# Patient Record
Sex: Male | Born: 1990 | Race: Black or African American | Hispanic: No | Marital: Single | Smoking: Current every day smoker
Health system: Southern US, Community
[De-identification: ages and names within clinical notes are randomized; demographics above are authoritative.]

## PROBLEM LIST (undated history)

## (undated) DIAGNOSIS — E119 Type 2 diabetes mellitus without complications: Secondary | ICD-10-CM

## (undated) DIAGNOSIS — I10 Essential (primary) hypertension: Secondary | ICD-10-CM

## (undated) DIAGNOSIS — J45909 Unspecified asthma, uncomplicated: Secondary | ICD-10-CM

## (undated) DIAGNOSIS — K219 Gastro-esophageal reflux disease without esophagitis: Secondary | ICD-10-CM

## (undated) DIAGNOSIS — J392 Other diseases of pharynx: Secondary | ICD-10-CM

## (undated) DIAGNOSIS — J329 Chronic sinusitis, unspecified: Secondary | ICD-10-CM

## (undated) HISTORY — PX: FOOT SURGERY: SHX648

## (undated) HISTORY — DX: Essential (primary) hypertension: I10

## (undated) HISTORY — DX: Type 2 diabetes mellitus without complications: E11.9

---

## 2008-11-17 ENCOUNTER — Emergency Department (HOSPITAL_COMMUNITY): Admission: EM | Admit: 2008-11-17 | Discharge: 2008-11-18 | Payer: Self-pay | Admitting: Emergency Medicine

## 2009-04-10 ENCOUNTER — Emergency Department (HOSPITAL_COMMUNITY): Admission: EM | Admit: 2009-04-10 | Discharge: 2009-04-10 | Payer: Self-pay | Admitting: Family Medicine

## 2009-05-19 ENCOUNTER — Emergency Department (HOSPITAL_COMMUNITY): Admission: EM | Admit: 2009-05-19 | Discharge: 2009-05-19 | Payer: Self-pay | Admitting: Family Medicine

## 2011-06-04 ENCOUNTER — Emergency Department (HOSPITAL_COMMUNITY)
Admission: EM | Admit: 2011-06-04 | Discharge: 2011-06-04 | Disposition: A | Discharge status: Home / Self Care | Payer: 59 | Attending: Emergency Medicine | Admitting: Emergency Medicine

## 2011-06-04 DIAGNOSIS — R369 Urethral discharge, unspecified: Secondary | ICD-10-CM | POA: Insufficient documentation

## 2011-06-04 DIAGNOSIS — Z202 Contact with and (suspected) exposure to infections with a predominantly sexual mode of transmission: Secondary | ICD-10-CM | POA: Insufficient documentation

## 2011-06-04 DIAGNOSIS — N509 Disorder of male genital organs, unspecified: Secondary | ICD-10-CM | POA: Insufficient documentation

## 2011-06-04 DIAGNOSIS — J45909 Unspecified asthma, uncomplicated: Secondary | ICD-10-CM | POA: Insufficient documentation

## 2011-06-04 DIAGNOSIS — A630 Anogenital (venereal) warts: Secondary | ICD-10-CM | POA: Insufficient documentation

## 2011-06-04 LAB — URINALYSIS, ROUTINE W REFLEX MICROSCOPIC
Bilirubin Urine: NEGATIVE
Glucose, UA: NEGATIVE mg/dL
Ketones, ur: NEGATIVE mg/dL
pH: 6.5 (ref 5.0–8.0)

## 2011-06-04 LAB — URINE MICROSCOPIC-ADD ON

## 2011-06-07 LAB — GC/CHLAMYDIA PROBE AMP, GENITAL: GC Probe Amp, Genital: POSITIVE — AB

## 2011-08-26 LAB — COMPREHENSIVE METABOLIC PANEL
ALT: 43 U/L (ref 0–53)
AST: 32 U/L (ref 0–37)
Albumin: 3.9 g/dL (ref 3.5–5.2)
CO2: 26 mEq/L (ref 19–32)
Calcium: 8.7 mg/dL (ref 8.4–10.5)
GFR calc Af Amer: >60 mL/min (ref >60)
Sodium: 129 mEq/L — ABNORMAL LOW (ref 135–145)

## 2011-08-26 LAB — CBC
MCHC: 32.3 g/dL (ref 30.0–36.0)
Platelets: 202 10*3/uL (ref 150–400)
RBC: 5.09 MIL/uL (ref 4.22–5.81)
WBC: 6.3 10*3/uL (ref 4.0–10.5)

## 2011-08-26 LAB — URINALYSIS, ROUTINE W REFLEX MICROSCOPIC
Nitrite: NEGATIVE
Protein, ur: NEGATIVE mg/dL
Urobilinogen, UA: 1 mg/dL (ref 0.0–1.0)

## 2011-08-26 LAB — DIFFERENTIAL
Basophils Absolute: 0 10*3/uL (ref 0.0–0.1)
Basophils Relative: 0 % (ref 0–1)
Lymphocytes Relative: 24 % (ref 12–46)
Neutro Abs: 4 10*3/uL (ref 1.7–7.7)
Neutrophils Relative %: 63 % (ref 43–77)

## 2011-08-26 LAB — LIPASE, BLOOD: Lipase: 15 U/L (ref 11–59)

## 2011-08-26 LAB — URINE MICROSCOPIC-ADD ON

## 2012-01-10 ENCOUNTER — Encounter (HOSPITAL_COMMUNITY): Payer: Self-pay | Admitting: Emergency Medicine

## 2012-01-10 ENCOUNTER — Emergency Department (HOSPITAL_COMMUNITY)
Admission: EM | Admit: 2012-01-10 | Discharge: 2012-01-10 | Disposition: A | Discharge status: Home / Self Care | Payer: 59 | Source: Home / Self Care

## 2012-01-10 DIAGNOSIS — K007 Teething syndrome: Secondary | ICD-10-CM

## 2012-01-10 DIAGNOSIS — B9789 Other viral agents as the cause of diseases classified elsewhere: Secondary | ICD-10-CM

## 2012-01-10 MED ORDER — PSEUDOEPHEDRINE HCL 60 MG PO TABS: 60.0000 mg | ORAL_TABLET | Freq: Four times a day (QID) | ORAL | Status: AC | PRN

## 2012-01-10 MED ORDER — LORATADINE 10 MG PO TABS: 10.0000 mg | ORAL_TABLET | Freq: Every day | ORAL | Status: DC

## 2012-01-10 MED ORDER — HYDROCODONE-ACETAMINOPHEN 7.5-500 MG/15ML PO SOLN: 10.0000 mL | Freq: Four times a day (QID) | ORAL | Status: AC | PRN

## 2012-01-10 NOTE — Discharge Instructions (Signed)
Please read the information below.  You may return to the urgent care if any time for any worsening symptoms or any new symptoms that concern you.   Antibiotic Nonuse  Your caregiver felt that the infection or problem was not one that would be helped with an antibiotic. Infections may be caused by viruses or bacteria. Only a caregiver can tell which one of these is the likely cause of an illness. A cold is the most common cause of infection in both adults and children. A cold is a virus. Antibiotic treatment will have no effect on a viral infection. Viruses can lead to many lost days of work caring for sick children and many missed days of school. Children may catch as many as 10 "colds" or "flus" per year during which they can be tearful, cranky, and uncomfortable. The goal of treating a virus is aimed at keeping the ill person comfortable. Antibiotics are medications used to help the body fight bacterial infections. There are relatively few types of bacteria that cause infections but there are hundreds of viruses. While both viruses and bacteria cause infection they are very different types of germs. A viral infection will typically go away by itself within 7 to 10 days. Bacterial infections may spread or get worse without antibiotic treatment. Examples of bacterial infections are:  Sore throats (like strep throat or tonsillitis).   Infection in the lung (pneumonia).   Ear and skin infections.  Examples of viral infections are:  Colds or flus.   Most coughs and bronchitis.   Sore throats not caused by Strep.   Runny noses.  It is often best not to take an antibiotic when a viral infection is the cause of the problem. Antibiotics can kill off the helpful bacteria that we have inside our body and allow harmful bacteria to start growing. Antibiotics can cause side effects such as allergies, nausea, and diarrhea without helping to improve the symptoms of the viral infection. Additionally,  repeated uses of antibiotics can cause bacteria inside of our body to become resistant. That resistance can be passed onto harmful bacterial. The next time you have an infection it may be harder to treat if antibiotics are used when they are not needed. Not treating with antibiotics allows our own immune system to develop and take care of infections more efficiently. Also, antibiotics will work better for [place] when they are prescribed for bacterial infections. Treatments for a child that is ill may include:  Give extra fluids throughout the day to stay hydrated.   Get plenty of rest.   Only give your child over-the-counter or prescription medicines for pain, discomfort, or fever as directed by your caregiver.   The use of a cool mist humidifier may help stuffy noses.   Cold medications if suggested by your caregiver.  Your caregiver may decide to start you on an antibiotic if:  The problem you were seen for today continues for a longer length of time than expected.   You develop a secondary bacterial infection.  SEEK MEDICAL CARE IF:  Fever lasts longer than 5 days.   Symptoms continue to get worse after 5 to 7 days or become severe.   Difficulty in breathing develops.   Signs of dehydration develop (poor drinking, rare urinating, dark colored urine).   Changes in behavior or worsening tiredness (listlessness or lethargy).  Document Released: 01/16/2002 Document Revised: 07/20/2011 Document Reviewed: 07/15/2009 [facility] Patient Information 2012 [place], [place].Viral Infections A viral infection can be caused by  different types of viruses.Most viral infections are not serious and resolve on their own. However, some infections may cause severe symptoms and may lead to further complications. SYMPTOMS Viruses can frequently cause:  Minor sore throat.   Aches and pains.   Headaches.   Runny nose.   Different types of rashes.   Watery eyes.   Tiredness.   Cough.   Loss of  appetite.   Gastrointestinal infections, resulting in nausea, vomiting, and diarrhea.  These symptoms do not respond to antibiotics because the infection is not caused by bacteria. However, you might catch a bacterial infection following the viral infection. This is sometimes called a "superinfection." Symptoms of such a bacterial infection may include:  Worsening sore throat with pus and difficulty swallowing.   Swollen neck glands.   Chills and a high or persistent fever.   Severe headache.   Tenderness over the sinuses.   Persistent overall ill feeling (malaise), muscle aches, and tiredness (fatigue).   Persistent cough.   Yellow, green, or brown mucus production with coughing.  HOME CARE INSTRUCTIONS   Only take over-the-counter or prescription medicines for pain, discomfort, diarrhea, or fever as directed by your caregiver.   Drink enough water and fluids to keep your urine clear or pale yellow. Sports drinks can provide valuable electrolytes, sugars, and hydration.   Get plenty of rest and maintain proper nutrition. Soups and broths with crackers or rice are fine.  SEEK IMMEDIATE MEDICAL CARE IF:   You have severe headaches, shortness of breath, chest pain, neck pain, or an unusual rash.   You have uncontrolled vomiting, diarrhea, or you are unable to keep down fluids.   You or your child has an oral temperature above 102 F (38.9 C), not controlled by medicine.   Your baby is older than 3 months with a rectal temperature of 102 F (38.9 C) or higher.   Your baby is 22 months old or younger with a rectal temperature of 100.4 F (38 C) or higher.  MAKE SURE YOU:   Understand these instructions.   Will watch your condition.   Will get help right away if you are not doing well or get worse.  Document Released: 08/17/2005 Document Revised: 07/20/2011 Document Reviewed: 03/14/2011 [facility] Patient Information 2012 [place], [place].Teething Babies usually start cutting  teeth between 34 to 24 months of age and continue teething until they are about 23 years old. Because teething irritates the gums, it causes babies to cry, drool a lot, and to chew on things. In addition, you may notice a change in eating or sleeping habits. However, some babies never develop teething symptoms.  You can help relieve the pain of teething by using the following measures:  Massage your baby's gums firmly with your finger or an ice cube covered with a cloth. If you do this before meals, feeding is easier.   Let your baby chew on a wet wash cloth or teething ring that you have cooled in the freezer. Never tie a teething ring around your baby's neck. It could catch on something and choke your baby. Teething biscuits or frozen banana slices are good for chewing also.   Only give over-the-counter or prescription medicines for pain, discomfort, or fever as directed by your child's caregiver. Use numbing gels as directed by your child's caregiver. Numbing gels are less helpful than the measures described above and can be harmful in high doses.   Use a cup to give fluids if nursing or sucking from a  bottle is too difficult.  SEEK MEDICAL CARE IF:  Your baby does not respond to treatment.   Your baby has a fever.   Your baby has uncontrolled fussiness.   Your baby has red, swollen gums.   Your baby is wetting less diapers than normal (sign of dehydration).  Document Released: 12/15/2004 Document Revised: 07/20/2011 Document Reviewed: 03/02/2009 [facility] Patient Information 2012 [place], [place].Antibiotic Treatment and Drug Resistance Antibiotics are very helpful drugs. They are used to treat bacterial infections. But they do not help viral infections. Because antibiotics are used so often, some bacteria do not respond to them. Respiratory illnesses are mostly caused by viral infections. These will get better without using antibiotics. If an antibiotic is prescribed for a viral illness, a  patient may notice bad side effects. Some of these may be:  Allergic reactions.   Diarrhea.   Abdominal discomfort.   Vomiting.   Yeast infection.  Antibiotics also can be expensive. Paying for an antibiotic to treat a viral illness is both a medical risk and a waste of money. The use of antibiotics has fostered the growth of resistant bacteria. These bacteria can cause infections which are harder to treat. For these reasons, doctors are more selective in prescribing antibiotics. If you have not received an antibiotic today, be sure to follow up as directed if you are not improving. If you have received an antibiotic, take it as directed. Finish the full course. Keep your caregiver informed of any side effects. Document Released: 12/15/2004 Document Revised: 07/20/2011 Document Reviewed: 11/07/2005 [facility] Patient Information 2012 [place], [place].

## 2012-01-10 NOTE — ED Provider Notes (Signed)
Medical screening examination/treatment/procedure(s) were performed by non-physician practitioner and as supervising physician I was immediately available for consultation/collaboration.   [facility]; MD   Sharin Grave, MD 01/10/12 2120

## 2012-01-10 NOTE — ED Notes (Signed)
Pt. Stated, I think i have a sinus infection since last Wed.  My head, face, ear and jaw hurts.

## 2012-01-10 NOTE — ED Provider Notes (Signed)
History     CSN: [id number]  Arrival date & time 01/10/12  1854   None     Chief Complaint  Patient presents with  . Headache    (Consider location/radiation/quality/duration/timing/severity/associated sxs/prior treatment) HPI Comments: Patient reports he has had cough, sore throat, nasal congestion, and bilateral ear pain x 6 days  - states all of these symptoms have been slowly improving and then 3 days ago he began having right rear molar pain that radiates into his forehead and sinsuses.  States his symptoms are worse with the cold air.  Pt thinks he had fevers the first few days he was sick but this has since resolved.   Patient is a 21 y.o. male presenting with headaches. The history is provided by the patient.  Headache The primary symptoms include headaches.  The headache is not associated with neck stiffness.  Additional symptoms do not include neck stiffness.    History reviewed. No pertinent past medical history.  History reviewed. No pertinent past surgical history.  No family history on file.  History  Substance Use Topics  . Smoking status: Current Everyday Smoker -- 5 years  . Smokeless tobacco: Not on file  . Alcohol Use:      occassional      Review of Systems  HENT: Negative for trouble swallowing, neck pain and neck stiffness.   Neurological: Positive for headaches.  All other systems reviewed and are negative.    Allergies  Review of patient's allergies indicates no known allergies.  Home Medications  No current outpatient prescriptions on file.  BP 128/60  Pulse 81  Temp(Src) 99.3 F (37.4 C) (Oral)  Resp 16  SpO2 100%  Physical Exam  Nursing note and vitals reviewed. Constitutional: He is oriented to person, place, and time. He appears well-developed and well-nourished. No distress.  HENT:  Head: Normocephalic and atraumatic.  Right Ear: Tympanic membrane and ear canal normal.  Left Ear: Tympanic membrane and ear canal normal.    Nose: Mucosal edema and rhinorrhea present.  Mouth/Throat: Uvula is midline and mucous membranes are normal. Mucous membranes are not dry. No uvula swelling. No oropharyngeal exudate, posterior oropharyngeal edema, posterior oropharyngeal erythema or tonsillar abscesses.    Neck: Trachea normal, normal range of motion and phonation normal. Neck supple. No tracheal tenderness present. No rigidity. No tracheal deviation and normal range of motion present.  Cardiovascular: Normal rate, regular rhythm and normal heart sounds.   Pulmonary/Chest: Effort normal and breath sounds normal. No stridor. No respiratory distress. He has no wheezes. He has no rales.  Neurological: He is alert and oriented to person, place, and time.  Skin: He is not diaphoretic.    ED Course  Procedures (including critical care time)  Labs Reviewed - No data to display No results found.   1. Tooth eruption   2. Viral respiratory illness       MDM  Pt with six days of URI that is improving with exception of bilateral ear pain and nasal congestion, now with dental eruption (3rd right lower wisdom tooth).  Lungs CTAB- doubt pneumonia, oropharynx without exudate or erythema- doubt strep.  TMs are normal.  Pt's pain is likely related to nasal congestion from lasting viral infection coupled with tooth eruption.  Pt d/c home with symptomatic treatment.  Patient verbalizes understanding and agrees with plan.          Dillard Cannon [place], [place] 01/10/12 2108

## 2012-04-13 ENCOUNTER — Emergency Department (INDEPENDENT_AMBULATORY_CARE_PROVIDER_SITE_OTHER): Payer: 59

## 2012-04-13 ENCOUNTER — Encounter (HOSPITAL_COMMUNITY): Payer: Self-pay | Admitting: *Deleted

## 2012-04-13 ENCOUNTER — Emergency Department (HOSPITAL_COMMUNITY)
Admission: EM | Admit: 2012-04-13 | Discharge: 2012-04-13 | Disposition: A | Discharge status: Home / Self Care | Payer: 59 | Source: Home / Self Care | Attending: Family Medicine | Admitting: Family Medicine

## 2012-04-13 DIAGNOSIS — J45909 Unspecified asthma, uncomplicated: Secondary | ICD-10-CM

## 2012-04-13 DIAGNOSIS — J45901 Unspecified asthma with (acute) exacerbation: Secondary | ICD-10-CM

## 2012-04-13 MED ORDER — METHYLPREDNISOLONE ACETATE 40 MG/ML IJ SUSP
80.0000 mg | Freq: Once | INTRAMUSCULAR | Status: AC
  Administered 2012-04-13: 80 mg via INTRAMUSCULAR

## 2012-04-13 MED ORDER — ALBUTEROL SULFATE (5 MG/ML) 0.5% IN NEBU
5.0000 mg | INHALATION_SOLUTION | Freq: Once | RESPIRATORY_TRACT | Status: AC
  Administered 2012-04-13: 5 mg via RESPIRATORY_TRACT

## 2012-04-13 MED ORDER — TRIAMCINOLONE ACETONIDE 40 MG/ML IJ SUSP
INTRAMUSCULAR | Status: AC
  Filled 2012-04-13: qty 5

## 2012-04-13 MED ORDER — TRIAMCINOLONE ACETONIDE 40 MG/ML IJ SUSP
40.0000 mg | Freq: Once | INTRAMUSCULAR | Status: AC
  Administered 2012-04-13: 40 mg via INTRAMUSCULAR

## 2012-04-13 MED ORDER — METHYLPREDNISOLONE ACETATE 80 MG/ML IJ SUSP
INTRAMUSCULAR | Status: AC
  Filled 2012-04-13: qty 1

## 2012-04-13 MED ORDER — ALBUTEROL SULFATE HFA 108 (90 BASE) MCG/ACT IN AERS: 1.0000 | INHALATION_SPRAY | Freq: Four times a day (QID) | RESPIRATORY_TRACT | Status: DC | PRN

## 2012-04-13 MED ORDER — ALBUTEROL SULFATE (5 MG/ML) 0.5% IN NEBU
INHALATION_SOLUTION | RESPIRATORY_TRACT | Status: AC
  Filled 2012-04-13: qty 1

## 2012-04-13 MED ORDER — IPRATROPIUM BROMIDE 0.02 % IN SOLN
0.5000 mg | Freq: Once | RESPIRATORY_TRACT | Status: AC
  Administered 2012-04-13: 0.5 mg via RESPIRATORY_TRACT

## 2012-04-13 NOTE — ED Provider Notes (Signed)
History     CSN: [id number]  Arrival date & time 04/13/12  4098   First MD Initiated Contact with Patient 04/13/12 [phone]      Chief Complaint  Patient presents with  . Cough    (Consider location/radiation/quality/duration/timing/severity/associated sxs/prior treatment) Patient is a 21 y.o. male presenting with cough. The history is provided by the patient.  Cough This is a new problem. 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 The problem has been gradually worsening. The cough is productive of sputum. There has been no fever. Associated symptoms include shortness of breath and wheezing. He has tried nothing (has not had mdi for sev years.) for the symptoms. He is a smoker. His past medical history is significant for asthma.    History reviewed. No pertinent past medical history.  History reviewed. No pertinent past surgical history.  No family history on file.  History  Substance Use Topics  . Smoking status: Current Everyday Smoker -- 5 years  . Smokeless tobacco: Not on file  . Alcohol Use: 0.0 oz/week    1-2 Cans of beer per week     occassional      Review of Systems  Constitutional: Negative.   Respiratory: Positive for cough, shortness of breath and wheezing.     Allergies  Review of patient's allergies indicates no known allergies.  Home Medications   Current Outpatient Rx  Name Route Sig Dispense Refill  . ALBUTEROL SULFATE HFA 108 (90 BASE) MCG/ACT IN AERS Inhalation Inhale 1-2 puffs into the lungs every 6 (six) hours as needed for wheezing. 1 Inhaler 0  . LORATADINE 10 MG PO TABS Oral Take 1 tablet (10 mg total) by mouth daily. 15 tablet 0    BP 135/78  Pulse 61  Temp(Src) 98.5 F (36.9 C) (Oral)  Resp 20  SpO2 98%  Physical Exam  Nursing note and vitals reviewed. Constitutional: He is oriented to person, place, and time. He appears well-developed and well-nourished.    HENT:  Head: Normocephalic.  Right Ear: External ear normal.  Left Ear: External ear normal.  Nose: Nose normal.  Mouth/Throat: Oropharynx is clear and moist.  Eyes: Pupils are equal, round, and reactive to light.  Neck: Normal range of motion. Neck supple.  Cardiovascular: Regular rhythm and normal heart sounds.   Pulmonary/Chest: Not tachypneic. No respiratory distress. He has wheezes. He has rhonchi.  Lymphadenopathy:    He has no cervical adenopathy.  Neurological: He is alert and oriented to person, place, and time.  Skin: Skin is warm and dry.    ED Course  Procedures (including critical care time)  Labs Reviewed - No data to display Dg Chest 2 View  04/13/2012  *RADIOLOGY REPORT*  Clinical Data: Cough.  Asthma.  CHEST - 2 VIEW  Comparison:  None.  Findings:  The heart size and mediastinal contours are within normal limits.  Both lungs are clear.  The visualized skeletal structures are unremarkable.  IMPRESSION: No active cardiopulmonary disease.  Original Report Authenticated By: Danae Orleans, M.D.     1. Asthma attack       MDM  X-rays reviewed and report per radiologist.  Lungs clear and sx improved after meds.         Linna Hoff, MD 04/13/12 [phone]

## 2012-04-13 NOTE — ED Notes (Signed)
Pt   Reports     He  Has  Asthma        He  Reports  He  Used  To  Take  Albuterol        Last   Time  He  Used  It  Was  2  Years  Ago      He  Reports     He  Started  A  New  Job  Painting      Recently         - he  Is  Wheezing  And  Congested        He  Is  Also a  Smoker

## 2013-01-03 ENCOUNTER — Encounter (HOSPITAL_COMMUNITY): Payer: Self-pay | Admitting: Emergency Medicine

## 2013-01-03 ENCOUNTER — Inpatient Hospital Stay (HOSPITAL_COMMUNITY)
Admission: EM | Admit: 2013-01-03 | Discharge: 2013-01-05 | DRG: 639 | Disposition: A | Discharge status: Home / Self Care | Payer: 59 | Attending: Internal Medicine | Admitting: Internal Medicine

## 2013-01-03 DIAGNOSIS — F17200 Nicotine dependence, unspecified, uncomplicated: Secondary | ICD-10-CM | POA: Diagnosis present

## 2013-01-03 DIAGNOSIS — R030 Elevated blood-pressure reading, without diagnosis of hypertension: Secondary | ICD-10-CM | POA: Diagnosis present

## 2013-01-03 DIAGNOSIS — E1010 Type 1 diabetes mellitus with ketoacidosis without coma: Principal | ICD-10-CM | POA: Diagnosis present

## 2013-01-03 DIAGNOSIS — K5900 Constipation, unspecified: Secondary | ICD-10-CM | POA: Diagnosis present

## 2013-01-03 DIAGNOSIS — R739 Hyperglycemia, unspecified: Secondary | ICD-10-CM

## 2013-01-03 DIAGNOSIS — Z79899 Other long term (current) drug therapy: Secondary | ICD-10-CM

## 2013-01-03 DIAGNOSIS — Z833 Family history of diabetes mellitus: Secondary | ICD-10-CM

## 2013-01-03 DIAGNOSIS — E1110 Type 2 diabetes mellitus with ketoacidosis without coma: Secondary | ICD-10-CM | POA: Diagnosis present

## 2013-01-03 DIAGNOSIS — E876 Hypokalemia: Secondary | ICD-10-CM | POA: Diagnosis not present

## 2013-01-03 DIAGNOSIS — J45909 Unspecified asthma, uncomplicated: Secondary | ICD-10-CM | POA: Diagnosis present

## 2013-01-03 DIAGNOSIS — Z791 Long term (current) use of non-steroidal anti-inflammatories (NSAID): Secondary | ICD-10-CM

## 2013-01-03 DIAGNOSIS — E109 Type 1 diabetes mellitus without complications: Secondary | ICD-10-CM | POA: Diagnosis present

## 2013-01-03 HISTORY — DX: Unspecified asthma, uncomplicated: J45.909

## 2013-01-03 LAB — COMPREHENSIVE METABOLIC PANEL
ALT: 14 U/L (ref 0–53)
AST: 12 U/L (ref 0–37)
Albumin: 4.2 g/dL (ref 3.5–5.2)
Alkaline Phosphatase: 117 U/L (ref 39–117)
BUN: 7 mg/dL (ref 6–23)
CO2: 22 mEq/L (ref 19–32)
Calcium: 9.6 mg/dL (ref 8.4–10.5)
Chloride: 82 mEq/L — ABNORMAL LOW (ref 96–112)
Creatinine, Ser: 0.86 mg/dL (ref 0.50–1.35)
GFR calc Af Amer: >90 mL/min (ref >90)
GFR calc non Af Amer: >90 mL/min (ref >90)
Glucose, Bld: 907 mg/dL (ref 70–99)
Potassium: 3.9 mEq/L (ref 3.5–5.1)
Sodium: 124 mEq/L — ABNORMAL LOW (ref 135–145)
Total Bilirubin: 0.4 mg/dL (ref 0.3–1.2)
Total Protein: 7.9 g/dL (ref 6.0–8.3)

## 2013-01-03 LAB — URINALYSIS, ROUTINE W REFLEX MICROSCOPIC
Bilirubin Urine: NEGATIVE
Glucose, UA: >1000 mg/dL — AB
Hgb urine dipstick: NEGATIVE
Ketones, ur: 15 mg/dL — AB
Leukocytes, UA: NEGATIVE
Nitrite: NEGATIVE
Protein, ur: NEGATIVE mg/dL
Specific Gravity, Urine: 1.035 — ABNORMAL HIGH (ref 1.005–1.030)
Urobilinogen, UA: 0.2 mg/dL (ref 0.0–1.0)
pH: 6 (ref 5.0–8.0)

## 2013-01-03 LAB — CBC
HCT: 42.7 % (ref 39.0–52.0)
Hemoglobin: 14.3 g/dL (ref 13.0–17.0)
MCH: 28.1 pg (ref 26.0–34.0)
MCHC: 33.5 g/dL (ref 30.0–36.0)
MCV: 83.9 fL (ref 78.0–100.0)
Platelets: 220 10*3/uL (ref 150–400)
RBC: 5.09 MIL/uL (ref 4.22–5.81)
RDW: 12.7 % (ref 11.5–15.5)
WBC: 9.1 10*3/uL (ref 4.0–10.5)

## 2013-01-03 LAB — GLUCOSE, CAPILLARY
Glucose-Capillary: >600 mg/dL (ref 70–99)
Glucose-Capillary: 359 mg/dL — ABNORMAL HIGH (ref 70–99)
Glucose-Capillary: 281 mg/dL — ABNORMAL HIGH (ref 70–99)
Glucose-Capillary: 277 mg/dL — ABNORMAL HIGH (ref 70–99)

## 2013-01-03 LAB — URINE MICROSCOPIC-ADD ON: Urine-Other: NONE SEEN

## 2013-01-03 MED ORDER — ONDANSETRON HCL 4 MG/2ML IJ SOLN: 4.0000 mg | Freq: Three times a day (TID) | INTRAMUSCULAR | Status: AC | PRN

## 2013-01-03 MED ORDER — SODIUM CHLORIDE 0.9 % IV SOLN
INTRAVENOUS | Status: DC
  Filled 2013-01-03: qty 1

## 2013-01-03 MED ORDER — SODIUM CHLORIDE 0.9 % IV SOLN
INTRAVENOUS | Status: DC
  Administered 2013-01-03: 5.4 [IU]/h via INTRAVENOUS
  Filled 2013-01-03: qty 1

## 2013-01-03 MED ORDER — POTASSIUM CHLORIDE 10 MEQ/100ML IV SOLN
10.0000 meq | INTRAVENOUS | Status: AC
  Administered 2013-01-04 (×4): 10 meq via INTRAVENOUS
  Filled 2013-01-03: qty 100

## 2013-01-03 MED ORDER — SODIUM CHLORIDE 0.9 % IV SOLN: INTRAVENOUS | Status: DC

## 2013-01-03 MED ORDER — SODIUM CHLORIDE 0.9 % IV SOLN
INTRAVENOUS | Status: DC
  Administered 2013-01-03: 20:00:00 via INTRAVENOUS

## 2013-01-03 MED ORDER — DEXTROSE-NACL 5-0.45 % IV SOLN: INTRAVENOUS | Status: DC

## 2013-01-03 MED ORDER — LORATADINE 10 MG PO TABS
10.0000 mg | ORAL_TABLET | Freq: Every day | ORAL | Status: DC
  Administered 2013-01-04 – 2013-01-05 (×2): 10 mg via ORAL
  Filled 2013-01-03 (×2): qty 1

## 2013-01-03 MED ORDER — DEXTROSE-NACL 5-0.45 % IV SOLN
INTRAVENOUS | Status: DC
  Administered 2013-01-04: via INTRAVENOUS

## 2013-01-03 MED ORDER — SODIUM CHLORIDE 0.9 % IV BOLUS (SEPSIS)
1000.0000 mL | Freq: Once | INTRAVENOUS | Status: AC
  Administered 2013-01-03: 1000 mL via INTRAVENOUS

## 2013-01-03 MED ORDER — ALBUTEROL SULFATE HFA 108 (90 BASE) MCG/ACT IN AERS
1.0000 | INHALATION_SPRAY | Freq: Four times a day (QID) | RESPIRATORY_TRACT | Status: DC | PRN
  Filled 2013-01-03: qty 6.7

## 2013-01-03 MED ORDER — INSULIN REGULAR BOLUS VIA INFUSION
0.0000 [IU] | Freq: Three times a day (TID) | INTRAVENOUS | Status: DC
  Filled 2013-01-03: qty 10

## 2013-01-03 MED ORDER — ENOXAPARIN SODIUM 40 MG/0.4ML ~~LOC~~ SOLN
40.0000 mg | Freq: Every day | SUBCUTANEOUS | Status: DC
  Administered 2013-01-04 (×2): 40 mg via SUBCUTANEOUS
  Filled 2013-01-03 (×3): qty 0.4

## 2013-01-03 MED ORDER — DEXTROSE 50 % IV SOLN: 25.0000 mL | INTRAVENOUS | Status: DC | PRN

## 2013-01-03 NOTE — ED Notes (Signed)
Pt states he's had dry mouth, excessive thirst, and increased urination so he checked his blood sugar using his friend's glucometer and it read "High". Pt denies h/o diabetes and states he's never had problems with his blood sugar that he knows of.

## 2013-01-03 NOTE — ED Notes (Signed)
Need pump for insulin drip called portable awaiting arrival. PA informed

## 2013-01-03 NOTE — ED Notes (Signed)
[id number]<AV> Expected date:<BR> Expected time:<BR> Means of arrival:<BR> Comments:<BR> Christopher Douglas, triage

## 2013-01-03 NOTE — ED Provider Notes (Signed)
Medical screening examination/treatment/procedure(s) were conducted as a shared visit with non-physician practitioner(s) and myself.  I personally evaluated the patient during the encounter  Pt with polydypsia, fatigue, weight loss over 1 month time.  No obv family history of DM that he knows of.  No other sig PMH.  No N/V.   Results for orders placed during the hospital encounter of 01/03/13 (from the past 24 hour(s))  URINALYSIS, ROUTINE W REFLEX MICROSCOPIC     Status: Abnormal   Collection Time    01/03/13  5:17 PM      Result Value Range   Color, Urine YELLOW  YELLOW   APPearance CLEAR  CLEAR   Specific Gravity, Urine 1.035 (*) 1.005 - 1.030   pH 6.0  5.0 - 8.0   Glucose, UA >1000 (*) NEGATIVE mg/dL   Hgb urine dipstick NEGATIVE  NEGATIVE   Bilirubin Urine NEGATIVE  NEGATIVE   Ketones, ur 15 (*) NEGATIVE mg/dL   Protein, ur NEGATIVE  NEGATIVE mg/dL   Urobilinogen, UA 0.2  0.0 - 1.0 mg/dL   Nitrite NEGATIVE  NEGATIVE   Leukocytes, UA NEGATIVE  NEGATIVE  URINE MICROSCOPIC-ADD ON     Status: None   Collection Time    01/03/13  5:17 PM      Result Value Range   Urine-Other       Value: NO FORMED ELEMENTS SEEN ON URINE MICROSCOPIC EXAMINATION  GLUCOSE, CAPILLARY     Status: Abnormal   Collection Time    01/03/13  5:22 PM      Result Value Range   Glucose-Capillary >600 (*) 70 - 99 mg/dL  CBC     Status: None   Collection Time    01/03/13  6:00 PM      Result Value Range   WBC 9.1  4.0 - 10.5 K/uL   RBC 5.09  4.22 - 5.81 MIL/uL   Hemoglobin 14.3  13.0 - 17.0 g/dL   HCT 29.5  62.1 - 30.8 %   MCV 83.9  78.0 - 100.0 fL   MCH 28.1  26.0 - 34.0 pg   MCHC 33.5  30.0 - 36.0 g/dL   RDW 65.7  84.6 - 96.2 %   Platelets 220  150 - 400 K/uL  COMPREHENSIVE METABOLIC PANEL     Status: Abnormal   Collection Time    01/03/13  6:00 PM      Result Value Range   Sodium 124 (*) 135 - 145 mEq/L   Potassium 3.9  3.5 - 5.1 mEq/L   Chloride 82 (*) 96 - 112 mEq/L   CO2 22  19 - 32 mEq/L   Glucose, Bld 907 (*) 70 - 99 mg/dL   BUN 7  6 - 23 mg/dL   Creatinine, Ser 9.52  0.50 - 1.35 mg/dL   Calcium 9.6  8.4 - 84.1 mg/dL   Total Protein 7.9  6.0 - 8.3 g/dL   Albumin 4.2  3.5 - 5.2 g/dL   AST 12  0 - 37 U/L   ALT 14  0 - 53 U/L   Alkaline Phosphatase 117  39 - 117 U/L   Total Bilirubin 0.4  0.3 - 1.2 mg/dL   GFR calc non Af Amer >90  >90 mL/min   GFR calc Af Amer >90  >90 mL/min  GLUCOSE, CAPILLARY     Status: Abnormal   Collection Time    01/03/13  7:01 PM      Result Value Range   Glucose-Capillary >600 (*)  70 - 99 mg/dL      Sig hyperglycemia, new onset DM.  Given age, possibly type 1.  Pt with ketones in urine, anion gap of 20 suggests early DKA.  Requires sig IVF resuscitation, IV insulin and admission to telemtry or step down.  Not hypotensive, normal mentation.      CRITICAL CARE Performed by: Lear Ng   Total critical care time: 30 min  Critical care time was exclusive of separately billable procedures and treating other patients.  Critical care was necessary to treat or prevent imminent or life-threatening deterioration.  Critical care was time spent personally by me on the following activities: development of treatment plan with patient and/or surrogate as well as nursing, discussions with consultants, evaluation of patient's response to treatment, examination of patient, obtaining history from patient or surrogate, ordering and performing treatments and interventions, ordering and review of laboratory studies, ordering and review of radiographic studies, pulse oximetry and re-evaluation of patient's condition.    Impression: Early DKA   Christopher Douglas. Oletta Lamas, MD 01/03/13 1610

## 2013-01-03 NOTE — ED Notes (Signed)
Pt in room sitting up alert and oriented w/o any s/s of hyperglycemia at time. Pt with family at bedside. Will continue to mointor.

## 2013-01-03 NOTE — ED Notes (Signed)
Called to give report nurse unavailable will call back.  

## 2013-01-03 NOTE — H&P (Signed)
[facility] History and Physical  Tommy Goostree ZOX:[id number] DOB: 02-May-1991 DOA: 01/03/2013  Referring physician: Dr. Oletta Lamas. PCP: No primary provider on file.  Specialists: None.  Chief Complaint: High blood sugar and polyuria and polydipsia.  HPI: Christopher Douglas is a 22 y.o. male with history of asthma was experiencing polyuria and polydipsia with fatigue over the last 2-3 weeks. He checked his blood sugar today with help of his girlfriend who is a Engineer, civil (consulting) and was found to have a high blood sugar and was unrecordable in the glucometer. Patient was brought to the ER and labs repeat high blood sugar and patient does not have history of diabetes. Patient otherwise denies any chest pain shortness of breath nausea vomiting or diarrhea. He's been feeling fatigued with some weight loss over the last few months. Labs reveal mildly elevated anion gap patient has been started on IV insulin infusion. Patient is admitted for DKA.  Review of Systems: The patient denies anorexia, fever, vision loss, decreased hearing, hoarseness, chest pain, syncope, dyspnea on exertion, peripheral edema, balance deficits, hemoptysis, abdominal pain, melena, hematochezia, severe indigestion/heartburn, hematuria, incontinence, genital sores, muscle weakness, suspicious skin lesions, transient blindness, difficulty walking, depression, abnormal bleeding, enlarged lymph nodes, angioedema, and breast masses. Has weight loss and fatigue.  Past Medical History  Diagnosis Date  . Asthma    Past Surgical History  Procedure Laterality Date  . Foot surgery     Social History:  reports that he has been smoking.  He does not have any smokeless tobacco history on file. He reports that  drinks alcohol. He reports that he does not use illicit drugs. At home. where does patient live--home, ALF, SNF? and with whom if at home? He can perform ADLs. Can patient participate in ADLs?  No Known Allergies  Family History  Problem Relation  Age of Onset  . Diabetes Mellitus II Father     Prior to Admission medications   Medication Sig Start Date End Date Taking? Authorizing Provider  albuterol (PROVENTIL HFA;VENTOLIN HFA) 108 (90 BASE) MCG/ACT inhaler Inhale 1-2 puffs into the lungs every 6 (six) hours as needed for wheezing. 04/13/12 04/13/13 Yes Linna Hoff, MD  ibuprofen (ADVIL,MOTRIN) 200 MG tablet Take 600 mg by mouth every 6 (six) hours as needed for pain.   Yes Historical Provider, MD  loratadine (CLARITIN) 10 MG tablet Take 1 tablet (10 mg total) by mouth daily. 01/10/12 01/09/13 Yes Trixie Dredge, PA   Physical Exam: Filed Vitals:   01/03/13 1724 01/03/13 1941  BP: 140/78 132/78  Pulse: 116 94  Temp: 98.7 F (37.1 C) 98.8 F (37.1 C)  TempSrc: Oral Oral  Resp: 18 16  SpO2: 100% 98%     General:  Well-built and well-nourished. Not in distress.  Eyes: Anicteric no pallor.  ENT: No discharge from ears eyes nose. Mouth appears dry.  Neck: No mass felt.  Cardiovascular: S1-S2 heard.  Respiratory: Bilateral air entry present. No rhonchi crepitations.  Abdomen: Soft nontender bowel sounds present.  Skin: No erythema rash.  Musculoskeletal: Nontender.  Psychiatric: Appears normal.  Neurologic: Alert awake oriented time place and person moves upper and lower extremities 5 x 5.  Labs on Admission:  Basic Metabolic Panel:  Recent Labs Lab 01/03/13 1800  NA 124*  K 3.9  CL 82*  CO2 22  GLUCOSE 907*  BUN 7  CREATININE 0.86  CALCIUM 9.6   Liver Function Tests:  Recent Labs Lab 01/03/13 1800  AST 12  ALT 14  ALKPHOS 117  BILITOT 0.4  PROT 7.9  ALBUMIN 4.2   No results found for this basename: LIPASE, AMYLASE,  in the last 168 hours No results found for this basename: AMMONIA,  in the last 168 hours CBC:  Recent Labs Lab 01/03/13 1800  WBC 9.1  HGB 14.3  HCT 42.7  MCV 83.9  PLT 220   Cardiac Enzymes: No results found for this basename: CKTOTAL, CKMB, CKMBINDEX, TROPONINI,  in  the last 168 hours  BNP (last 3 results) No results found for this basename: PROBNP,  in the last 8760 hours CBG:  Recent Labs Lab 01/03/13 1722 01/03/13 1901  GLUCAP >600* >600*    Radiological Exams on Admission: No results found.   Assessment/Plan Principal Problem:   DKA (diabetic ketoacidoses) Active Problems:   Asthma   1. DKA - patient has been started on IV fluids and IV insulin infusion. Continue with IV insulin until anion is corrected. Check hemoglobin A1c. At this time we will treat this as new onset diabetes mellitus type 1. Check anti- islet cell antibodies, anti-GAD antibodies, C-peptide, anti-insulin antibodies to check whether patient has type I or type 2 diabetes. 2. Asthma - presently not wheezing. Continue albuterol when necessary. 3. Tobacco abuse - patient states he quit last month. Congratulated him for that.  No consultants. if consultant consulted, please document name and whether formally or informally consulted  Code Status: Full code. (must indicate code status--if unknown or must be presumed, indicate so) Family Communication: None at the bedside. (indicate person spoken with, if applicable, with phone number if by telephone) Disposition Plan: Admit inpatient. (indicate anticipated LOS)  Bonetta Mostek N. [facility] Pager [phone]  If 7PM-7AM, please contact night-coverage [email or web address] Password [facility] 01/03/2013, 8:12 PM

## 2013-01-03 NOTE — ED Notes (Signed)
Pt reports polyuria and polydipsia x3 weeks. Friend at bedside also reports fruity smell to pt's breath.

## 2013-01-03 NOTE — ED Provider Notes (Signed)
History     CSN: [id number]  Arrival date & time 01/03/13  1708   First MD Initiated Contact with Patient 01/03/13 1727      Chief Complaint  Patient presents with  . Hyperglycemia    (Consider location/radiation/quality/duration/timing/severity/associated sxs/prior treatment) HPI Christopher Douglas is a 22 y.o. male who presents to ED with complaint of elevated blood sugar. States he is not a known diabetic. States his girlfriend is a Engineer, civil (consulting). He has been having polyuria, polydypsia, weakness for the last month. States lost about 45 lb in the last few months un intentionally. States worsened in the last week. His girlfriend had him measure his blood sugar today and it said "high." Pt denies any medical problems. No recent illnesses or infections. Denies fever, chills, cough, URI symptoms, n/v/d.    History reviewed. No pertinent past medical history.  History reviewed. No pertinent past surgical history.  History reviewed. No pertinent family history.  History  Substance Use Topics  . Smoking status: Current Every Day Smoker -- 5 years  . Smokeless tobacco: Not on file  . Alcohol Use: 0.0 oz/week    1-2 Cans of beer per week     Comment: occassional      Review of Systems  Constitutional: Positive for appetite change and fatigue. Negative for fever and chills.  HENT: Negative for neck pain and neck stiffness.   Respiratory: Negative.   Cardiovascular: Negative.   Gastrointestinal: Negative for nausea, vomiting, abdominal pain and diarrhea.  Endocrine: Positive for polydipsia and polyuria. Negative for polyphagia.  Genitourinary: Negative for dysuria and flank pain.  Musculoskeletal: Negative.   Skin: Negative.   Neurological: Positive for weakness.  All other systems reviewed and are negative.    Allergies  Review of patient's allergies indicates no known allergies.  Home Medications   Current Outpatient Rx  Name  Route  Sig  Dispense  Refill  . albuterol (PROVENTIL  HFA;VENTOLIN HFA) 108 (90 BASE) MCG/ACT inhaler   Inhalation   Inhale 1-2 puffs into the lungs every 6 (six) hours as needed for wheezing.   1 Inhaler   0   . ibuprofen (ADVIL,MOTRIN) 200 MG tablet   Oral   Take 600 mg by mouth every 6 (six) hours as needed for pain.         Marland Kitchen loratadine (CLARITIN) 10 MG tablet   Oral   Take 1 tablet (10 mg total) by mouth daily.   15 tablet   0     BP 140/78  Pulse 116  Temp(Src) 98.7 F (37.1 C) (Oral)  Resp 18  SpO2 100%  Physical Exam  Nursing note and vitals reviewed. Constitutional: He is oriented to person, place, and time. He appears well-developed and well-nourished. No distress.  Eyes: Conjunctivae are normal.  Neck: Neck supple.  Cardiovascular: Normal rate, regular rhythm and normal heart sounds.   Pulmonary/Chest: Effort normal and breath sounds normal. No respiratory distress. He has no wheezes. He has no rales.  Abdominal: Soft. Bowel sounds are normal. He exhibits no distension. There is no tenderness. There is no rebound.  Musculoskeletal: He exhibits no edema.  Neurological: He is alert and oriented to person, place, and time.  Skin: Skin is warm.  Psychiatric: He has a normal mood and affect.    ED Course  Procedures (including critical care time)  Pt with polyuria, polydipsia, 45lb weight loss over last 2 months, weakness. CBG >600. Will get labs. Fluids started.    Results for orders placed during  the hospital encounter of 01/03/13  URINALYSIS, ROUTINE W REFLEX MICROSCOPIC      Result Value Range   Color, Urine YELLOW  YELLOW   APPearance CLEAR  CLEAR   Specific Gravity, Urine 1.035 (*) 1.005 - 1.030   pH 6.0  5.0 - 8.0   Glucose, UA >1000 (*) NEGATIVE mg/dL   Hgb urine dipstick NEGATIVE  NEGATIVE   Bilirubin Urine NEGATIVE  NEGATIVE   Ketones, ur 15 (*) NEGATIVE mg/dL   Protein, ur NEGATIVE  NEGATIVE mg/dL   Urobilinogen, UA 0.2  0.0 - 1.0 mg/dL   Nitrite NEGATIVE  NEGATIVE   Leukocytes, UA NEGATIVE   NEGATIVE  GLUCOSE, CAPILLARY      Result Value Range   Glucose-Capillary >600 (*) 70 - 99 mg/dL  CBC      Result Value Range   WBC 9.1  4.0 - 10.5 K/uL   RBC 5.09  4.22 - 5.81 MIL/uL   Hemoglobin 14.3  13.0 - 17.0 g/dL   HCT 78.4  69.6 - 29.5 %   MCV 83.9  78.0 - 100.0 fL   MCH 28.1  26.0 - 34.0 pg   MCHC 33.5  30.0 - 36.0 g/dL   RDW 28.4  13.2 - 44.0 %   Platelets 220  150 - 400 K/uL  COMPREHENSIVE METABOLIC PANEL      Result Value Range   Sodium 124 (*) 135 - 145 mEq/L   Potassium 3.9  3.5 - 5.1 mEq/L   Chloride 82 (*) 96 - 112 mEq/L   CO2 22  19 - 32 mEq/L   Glucose, Bld 907 (*) 70 - 99 mg/dL   BUN 7  6 - 23 mg/dL   Creatinine, Ser 1.02  0.50 - 1.35 mg/dL   Calcium 9.6  8.4 - 72.5 mg/dL   Total Protein 7.9  6.0 - 8.3 g/dL   Albumin 4.2  3.5 - 5.2 g/dL   AST 12  0 - 37 U/L   ALT 14  0 - 53 U/L   Alkaline Phosphatase 117  39 - 117 U/L   Total Bilirubin 0.4  0.3 - 1.2 mg/dL   GFR calc non Af Amer >90  >90 mL/min   GFR calc Af Amer >90  >90 mL/min  URINE MICROSCOPIC-ADD ON      Result Value Range   Urine-Other       Value: NO FORMED ELEMENTS SEEN ON URINE MICROSCOPIC EXAMINATION  GLUCOSE, CAPILLARY      Result Value Range   Glucose-Capillary >600 (*) 70 - 99 mg/dL   No results found.  7:49 PM Pt's glucose 907. Pt appears well, his VS are normal. Anion gap is 20. 15 ketones in urine. Pt in DKA. Will admit.   Spoke with [facility], admit to step down. Glucose stabilizer started.   1. DKA (diabetic ketoacidoses)   2. Hyperglycemia       MDM  Pt with polyuria, polydipsia, weakness. Glucose 907. Anion gap is 20. Pt will be admitted to inpatient for tx of DKA. In ED received 2L of NS, glucose stabilizer started. VS are normal.    Filed Vitals:   01/03/13 1724 01/03/13 1941  BP: 140/78 132/78  Pulse: 116 94  Temp: 98.7 F (37.1 C) 98.8 F (37.1 C)  TempSrc: Oral Oral  Resp: 18 16  SpO2: 100% 98%          Lottie Mussel, PA 01/03/13 1952

## 2013-01-04 DIAGNOSIS — E876 Hypokalemia: Secondary | ICD-10-CM

## 2013-01-04 DIAGNOSIS — J45909 Unspecified asthma, uncomplicated: Secondary | ICD-10-CM

## 2013-01-04 DIAGNOSIS — E1110 Type 2 diabetes mellitus with ketoacidosis without coma: Secondary | ICD-10-CM

## 2013-01-04 DIAGNOSIS — E109 Type 1 diabetes mellitus without complications: Secondary | ICD-10-CM | POA: Diagnosis present

## 2013-01-04 DIAGNOSIS — K5900 Constipation, unspecified: Secondary | ICD-10-CM | POA: Diagnosis present

## 2013-01-04 LAB — CBC
HCT: 36.4 % — ABNORMAL LOW (ref 39.0–52.0)
MCH: 28.7 pg (ref 26.0–34.0)
MCHC: 35.3 g/dL (ref 30.0–36.0)
MCV: 81.4 fL (ref 78.0–100.0)
Platelets: 179 10*3/uL (ref 150–400)
RDW: 12.7 % (ref 11.5–15.5)
RDW: 12.8 % (ref 11.5–15.5)
WBC: 9.1 10*3/uL (ref 4.0–10.5)

## 2013-01-04 LAB — BASIC METABOLIC PANEL
BUN: 5 mg/dL — ABNORMAL LOW (ref 6–23)
BUN: 5 mg/dL — ABNORMAL LOW (ref 6–23)
BUN: 5 mg/dL — ABNORMAL LOW (ref 6–23)
BUN: 8 mg/dL (ref 6–23)
CO2: 25 mEq/L (ref 19–32)
CO2: 27 mEq/L (ref 19–32)
Calcium: 8.6 mg/dL (ref 8.4–10.5)
Calcium: 8.4 mg/dL (ref 8.4–10.5)
Calcium: 8.8 mg/dL (ref 8.4–10.5)
Chloride: 97 mEq/L (ref 96–112)
Creatinine, Ser: 0.7 mg/dL (ref 0.50–1.35)
Creatinine, Ser: 0.78 mg/dL (ref 0.50–1.35)
Creatinine, Ser: 0.84 mg/dL (ref 0.50–1.35)
Creatinine, Ser: 0.87 mg/dL (ref 0.50–1.35)
GFR calc Af Amer: >90 mL/min (ref >90)
GFR calc non Af Amer: >90 mL/min (ref >90)
GFR calc non Af Amer: >90 mL/min (ref >90)
Glucose, Bld: 246 mg/dL — ABNORMAL HIGH (ref 70–99)
Glucose, Bld: 217 mg/dL — ABNORMAL HIGH (ref 70–99)
Glucose, Bld: 152 mg/dL — ABNORMAL HIGH (ref 70–99)
Glucose, Bld: 393 mg/dL — ABNORMAL HIGH (ref 70–99)
Potassium: 3 mEq/L — ABNORMAL LOW (ref 3.5–5.1)
Sodium: 136 mEq/L (ref 135–145)
Sodium: 129 mEq/L — ABNORMAL LOW (ref 135–145)

## 2013-01-04 LAB — GLUCOSE, CAPILLARY
Glucose-Capillary: 177 mg/dL — ABNORMAL HIGH (ref 70–99)
Glucose-Capillary: 142 mg/dL — ABNORMAL HIGH (ref 70–99)
Glucose-Capillary: 176 mg/dL — ABNORMAL HIGH (ref 70–99)
Glucose-Capillary: 223 mg/dL — ABNORMAL HIGH (ref 70–99)

## 2013-01-04 LAB — TROPONIN I: Troponin I: <0.3 ng/mL (ref <0.30)

## 2013-01-04 LAB — LIPID PANEL
Cholesterol: 184 mg/dL (ref 0–200)
Total CHOL/HDL Ratio: 3.2 RATIO

## 2013-01-04 LAB — TSH: TSH: 1.298 u[IU]/mL (ref 0.350–4.500)

## 2013-01-04 LAB — MRSA PCR SCREENING: MRSA by PCR: NEGATIVE

## 2013-01-04 MED ORDER — INSULIN PEN STARTER KIT
1.0000 | Freq: Once | Status: AC
  Administered 2013-01-04: 1
  Filled 2013-01-04: qty 1

## 2013-01-04 MED ORDER — INSULIN GLARGINE 100 UNIT/ML ~~LOC~~ SOLN
24.0000 [IU] | Freq: Every day | SUBCUTANEOUS | Status: DC
  Administered 2013-01-04 – 2013-01-05 (×2): 24 [IU] via SUBCUTANEOUS

## 2013-01-04 MED ORDER — LIVING WELL WITH DIABETES BOOK
Freq: Once | Status: AC
  Administered 2013-01-04: 17:00:00
  Filled 2013-01-04: qty 1

## 2013-01-04 MED ORDER — POTASSIUM CHLORIDE 20 MEQ/15ML (10%) PO LIQD
20.0000 meq | Freq: Once | ORAL | Status: AC
  Administered 2013-01-04: 20 meq via ORAL
  Filled 2013-01-04 (×2): qty 15

## 2013-01-04 MED ORDER — POLYETHYLENE GLYCOL 3350 17 G PO PACK
17.0000 g | PACK | Freq: Every day | ORAL | Status: DC
  Administered 2013-01-05: 17 g via ORAL
  Filled 2013-01-04 (×2): qty 1

## 2013-01-04 MED ORDER — POTASSIUM CHLORIDE CRYS ER 20 MEQ PO TBCR
40.0000 meq | EXTENDED_RELEASE_TABLET | ORAL | Status: AC
  Administered 2013-01-04 (×2): 40 meq via ORAL
  Filled 2013-01-04: qty 1
  Filled 2013-01-04: qty 2

## 2013-01-04 MED ORDER — DOCUSATE SODIUM 100 MG PO CAPS
100.0000 mg | ORAL_CAPSULE | Freq: Two times a day (BID) | ORAL | Status: DC
  Administered 2013-01-04 – 2013-01-05 (×3): 100 mg via ORAL
  Filled 2013-01-04 (×4): qty 1

## 2013-01-04 MED ORDER — INSULIN PEN STARTER KIT: 1.0000 | Freq: Once | Status: DC

## 2013-01-04 MED ORDER — INSULIN ASPART 100 UNIT/ML ~~LOC~~ SOLN
0.0000 [IU] | Freq: Three times a day (TID) | SUBCUTANEOUS | Status: DC
  Administered 2013-01-04 (×2): 8 [IU] via SUBCUTANEOUS
  Administered 2013-01-05: 5 [IU] via SUBCUTANEOUS
  Administered 2013-01-05: 11 [IU] via SUBCUTANEOUS

## 2013-01-04 MED ORDER — INSULIN ASPART 100 UNIT/ML ~~LOC~~ SOLN
0.0000 [IU] | Freq: Every day | SUBCUTANEOUS | Status: DC
  Administered 2013-01-04: 2 [IU] via SUBCUTANEOUS

## 2013-01-04 NOTE — Progress Notes (Signed)
[facility] PROGRESS NOTE  Christopher Douglas GNF:[id number] DOB: Nov 21, 1991 DOA: 01/03/2013  PCP: Does not have a PCP  Brief HPI: Christopher Douglas is a 22 y.o. male with history of asthma was experiencing polyuria and polydipsia with fatigue over the last 2-3 weeks. He checked his blood sugar with help of his girlfriend who is a Engineer, civil (consulting) and was found to have a high blood sugar and was unrecordable in the glucometer. Patient was brought to the ER and labs repeat high blood sugar and patient does not have history of diabetes. Patient otherwise denied any chest pain, shortness of breath, nausea, vomiting or diarrhea. He's been feeling fatigued with some weight loss over the last few months. Labs reveal mildly elevated anion gap patient has been started on IV insulin infusion. Patient was admitted for DKA.  Past medical history:  Past Medical History  Diagnosis Date  . Asthma     Consultants: None  Procedures: None  Antibiotics: None  Subjective: Patient complains of straining with bowel movement and constipation. Last BM was 3 days ago. Denies any abdominal pain. No nausea or vomiting.  Objective: Vital Signs  Filed Vitals:   01/04/13 0000 01/04/13 0200 01/04/13 0400 01/04/13 0600  BP: 127/73 131/65 128/64 136/77  Pulse: 84 81 80 87  Temp: 98.3 F (36.8 C)  98.1 F (36.7 C)   TempSrc: Oral  Oral   Resp: 16 17 18 12   Height:      Weight:      SpO2: 98% 100% 100% 100%    Intake/Output Summary (Last 24 hours) at 01/04/13 8469 Last data filed at 01/04/13 0600  Gross per 24 hour  Intake 1257.4 ml  Output    350 ml  Net  907.4 ml   Filed Weights   01/03/13 2328  Weight: 116.3 kg (256 lb 6.3 oz)    General appearance: alert, cooperative, appears stated age and no distress Neck: no adenopathy, no carotid bruit, no JVD, supple, symmetrical, trachea midline and thyroid not enlarged, symmetric, no tenderness/mass/nodules Back: symmetric, no curvature. ROM normal. No CVA  tenderness. Resp: clear to auscultation bilaterally Cardio: regular rate and rhythm, S1, S2 normal, no murmur, click, rub or gallop GI: soft, non-tender; bowel sounds normal; no masses,  no organomegaly Extremities: extremities normal, atraumatic, no cyanosis or edema Pulses: 2+ and symmetric Skin: Skin color, texture, turgor normal. No rashes or lesions Lymph nodes: Cervical, supraclavicular, and axillary nodes normal. Neurologic: Grossly normal  Lab Results:  Basic Metabolic Panel:  Recent Labs Lab 01/03/13 1800 01/03/13 2328 01/04/13 0120 01/04/13 0330 01/04/13 0530  NA 124* 136 137 134* 135  K 3.9 3.0* 3.0* 2.8* 2.9*  CL 82* 98 99 97 99  CO2 22 25 29 26 27   GLUCOSE 907* 246* 217* 182* 152*  BUN 7 5* 5* 5* 5*  CREATININE 0.86 0.70 0.80 0.78 0.84  CALCIUM 9.6 8.6 8.7 8.4 8.4   Liver Function Tests:  Recent Labs Lab 01/03/13 1800  AST 12  ALT 14  ALKPHOS 117  BILITOT 0.4  PROT 7.9  ALBUMIN 4.2   CBC:  Recent Labs Lab 01/03/13 1800 01/03/13 2359 01/04/13 0530  WBC 9.1 8.4 9.1  HGB 14.3 13.1 12.1*  HCT 42.7 37.1* 36.4*  MCV 83.9 81.4 82.7  PLT 220 179 177   Cardiac Enzymes:  Recent Labs Lab 01/03/13 2328  TROPONINI <0.30   CBG:  Recent Labs Lab 01/04/13 0217 01/04/13 0324 01/04/13 0427 01/04/13 0535 01/04/13 0637  GLUCAP 181* 172* 177* 142*  158*    Recent Results (from the past 240 hour(s))  MRSA PCR SCREENING     Status: None   Collection Time    01/03/13 11:13 PM      Result Value Range Status   MRSA by PCR NEGATIVE  NEGATIVE Final   Comment:            The GeneXpert MRSA Assay (FDA     approved for NASAL specimens     only), is one component of a     comprehensive MRSA colonization     surveillance program. It is not     intended to diagnose MRSA     infection nor to guide or     monitor treatment for     MRSA infections.      Studies/Results: No results found.  Medications:  Scheduled: . sodium chloride   Intravenous  STAT  . docusate sodium  100 mg Oral BID  . enoxaparin (LOVENOX) injection  40 mg Subcutaneous QHS  . insulin aspart  0-15 Units Subcutaneous TID WC  . insulin aspart  0-5 Units Subcutaneous QHS  . insulin glargine  24 Units Subcutaneous Daily  . loratadine  10 mg Oral Daily  . polyethylene glycol  17 g Oral Daily  . potassium chloride  40 mEq Oral Q4H   Continuous: . sodium chloride    . dextrose 5 % and 0.45% NaCl 125 mL/hr at 01/04/13 0000  . insulin (NOVOLIN-R) infusion     RUE:[id number], dextrose, ondansetron (ZOFRAN) IV  Assessment/Plan:  Principal Problem:   DKA (diabetic ketoacidoses) Active Problems:   Asthma   DM type 1 (diabetes mellitus, type 1)   Hypokalemia   Unspecified constipation    DKA with most likely DM 1 AG has closed. Will transition to Lantus. Advance diet. Start SSI. Diabetes education. Diet education. Will need PCP. Check HBA1c.   Hypokalemia Replete orally. Check Magnesium. Repeat K level later today.   Constipation TSH pending. Start stool softeners and laxatives. Further work up as outpatient.  History of Asthma Stable. Presently not wheezing. Continue albuterol when necessary.  Tobacco abuse Patient states he quit last month.  DVT Prophylaxis: Enoxaparin Code Status: Full code. Family Communication: Discussed with patient in detail. Disposition Plan: Anticipate discharge 2/15. Transfer to floor.    LOS: 1 day   [facility]  [facility] Pager [phone] 01/04/2013, 7:38 AM  If 8PM-8AM, please contact night-coverage at [email or web address], password [facility]

## 2013-01-04 NOTE — Plan of Care (Signed)
Problem: Phase I Progression Outcomes Goal: NPO or per MD order Outcome: Not Applicable Date Met:  01/04/13 Carb. Mod.

## 2013-01-04 NOTE — Progress Notes (Signed)
INITIAL NUTRITION ASSESSMENT  DOCUMENTATION CODES Per approved criteria  -Not Applicable   INTERVENTION:  RD consulted for nutrition education regarding diabetes.   No results found for this basename: HGBA1C   RD provided "Carbohydrate Counting for People with Diabetes" handout from the Academy of Nutrition and Dietetics. Discussed different food groups and their effects on blood sugar, emphasizing carbohydrate-containing foods. Provided list of carbohydrates and recommended serving sizes of common foods.  Discussed importance of controlled and consistent carbohydrate intake throughout the day. Provided examples of ways to balance meals/snacks. Teach back method used.  Expect good compliance.   NUTRITION DIAGNOSIS: Unintentional wt loss related to new onset Type I Diabetes as evidenced by 9% wt loss in less than 2 months.   Goal: Pt able to identify food groups that contain carbohydrates Blood glucose WNL  Monitor:  Wt Blood Glucose Po intake  Reason for Assessment: Consult for New Diabetes Education  22 y.o. male  Admitting Dx: DKA (diabetic ketoacidoses)  ASSESSMENT: Pt admitted due to high blood glucose with complaints of fatigue and wt loss. Pt reports that his usual body weight is 280 lbs; pt started losing weight after Christmas 2013 when his symptoms began. Pt reports that he usually drinks Gatorade and milkshakes. Basic diabetes education was provided (see above intervention) and pt voiced understanding with the teach-back method.  Height: Ht Readings from Last 1 Encounters:  01/03/13 6\' 3"  (1.905 m)    Weight: Wt Readings from Last 1 Encounters:  01/03/13 256 lb 6.3 oz (116.3 kg)    Ideal Body Weight: 196 lb  % Ideal Body Weight: 130%  Wt Readings from Last 10 Encounters:  01/03/13 256 lb 6.3 oz (116.3 kg)    Usual Body Weight: 280 lb  % Usual Body Weight: 91%  BMI:  Body mass index is 32.05 kg/(m^2).  Estimated Nutritional Needs: Kcal:  2900-3200 Protein: 95-120 grams Fluid: >3L/day  Skin: WDL  Diet Order: Carb Control  EDUCATION NEEDS: -Education needs addressed   Intake/Output Summary (Last 24 hours) at 01/04/13 1052 Last data filed at 01/04/13 0900  Gross per 24 hour  Intake 2233.6 ml  Output    350 ml  Net 1883.6 ml    Last BM: PTA  Labs:   Recent Labs Lab 01/04/13 0120 01/04/13 0330 01/04/13 0530  NA 137 134* 135  K 3.0* 2.8* 2.9*  CL 99 97 99  CO2 29 26 27   BUN 5* 5* 5*  CREATININE 0.80 0.78 0.84  CALCIUM 8.7 8.4 8.4  MG  --   --  1.8  GLUCOSE 217* 182* 152*    CBG (last 3)   Recent Labs  01/04/13 0427 01/04/13 0535 01/04/13 0637  GLUCAP 177* 142* 158*    Scheduled Meds: . docusate sodium  100 mg Oral BID  . enoxaparin (LOVENOX) injection  40 mg Subcutaneous QHS  . insulin aspart  0-15 Units Subcutaneous TID WC  . insulin aspart  0-5 Units Subcutaneous QHS  . insulin glargine  24 Units Subcutaneous Daily  . loratadine  10 mg Oral Daily  . polyethylene glycol  17 g Oral Daily  . potassium chloride  40 mEq Oral Q4H    Continuous Infusions:   Past Medical History  Diagnosis Date  . Asthma     Past Surgical History  Procedure Laterality Date  . Foot surgery      Ian Malkin RD, LDN Inpatient Clinical Dietitian Pager: [phone] After Hours Pager: [phone]

## 2013-01-04 NOTE — Progress Notes (Signed)
[id number]/Yareli Carthen, RN, BSN, CCM:  CHART REVIEWED AND UPDATED.  Next chart review due on 02172014. NO DISCHARGE NEEDS PRESENT AT THIS TIME. CASE MANAGEMENT [phone] 

## 2013-01-04 NOTE — Progress Notes (Addendum)
Inpatient Diabetes Program Recommendations  AACE/ADA: New Consensus Statement on Inpatient Glycemic Control (2013)  Target Ranges:  Prepandial:   less than 140 mg/dL      Peak postprandial:   less than 180 mg/dL (1-2 hours)      Critically ill patients:  140 - 180 mg/dL   Patient admitted with DKA and new onset diabetes.  A1c pending.  Spoke with pt about new diagnosis.  Explained what an A1C is, basic pathophysiology of DM Type 2, basic home care, importance of checking CBGs and maintaining good CBG control to prevent long-term and short-term complications.  Reviewed signs and symptoms of hyperglycemia and hypoglycemia and how to treat.    RNs to provide ongoing basic DM education at bedside with this patient.  Have ordered educational booklet, insulin starter kit, and DM videos.  Also ordered RD consult for DM diet education.  Educated patient and girlfriend on insulin pen use at home.  Reviewed contents of insulin flexpen starter kit.  Reviewed all steps if insulin pen including attachment of needle, 2-unit air shot, dialing up dose, giving injection, removing needle, disposal of sharps, storage of unused insulin, disposal of insulin etc.  Patient able to provide successful return demonstration.  Also reviewed troubleshooting with insulin pen.  MD to give patient Rxs for insulin pens and insulin pen needles.  Placed referral for patient for follow up education at the [facility] Nutrition and DM Management center.  Center to call patient for appointment.  Not sure if patient has Type 1 or Type 2 diabetes.  C-peptide is low at 0.1 ng/ml.  Will need insulin at discharge.  Patient has been educated on how to use insulin pens.  Would prefer pens at discharge.  Will likely need both Lantus and Novolog with meals at home.  May want to send patient home on an SSI regimen plus a set dose of Novolog meal coverage.  Patient can learn more about insulin to carbohydrate ratios at the outpatient  center.  Gave patient information on some of the local endocrinologists in [place].  Have recommended that patient follow-up with a PCP and that he may want to see an endocrinologist as well.  Patient very agreeable.  MD- Please make sure to give patient the following Rxs at discharge: 1. Lantus Solostar and Novolog Flexpens 2. Insulin pen needles 3. CBG meter Rx  Patient will likely be able to get all his medications and supplies at the [facility] pharmacy since he is employed by [employer].   Note: Will follow. Ambrose Finland RN, MSN, CDE Diabetes Coordinator Inpatient Diabetes Program [phone]

## 2013-01-05 DIAGNOSIS — E109 Type 1 diabetes mellitus without complications: Secondary | ICD-10-CM

## 2013-01-05 LAB — BASIC METABOLIC PANEL
BUN: 5 mg/dL — ABNORMAL LOW (ref 6–23)
CO2: 20 mEq/L (ref 19–32)
Chloride: 99 mEq/L (ref 96–112)
Glucose, Bld: 257 mg/dL — ABNORMAL HIGH (ref 70–99)
Potassium: 3.7 mEq/L (ref 3.5–5.1)
Sodium: 133 mEq/L — ABNORMAL LOW (ref 135–145)

## 2013-01-05 LAB — GLUCOSE, CAPILLARY
Glucose-Capillary: 225 mg/dL — ABNORMAL HIGH (ref 70–99)
Glucose-Capillary: 318 mg/dL — ABNORMAL HIGH (ref 70–99)

## 2013-01-05 MED ORDER — INSULIN ASPART 100 UNIT/ML ~~LOC~~ SOLN: SUBCUTANEOUS | Status: DC

## 2013-01-05 MED ORDER — INSULIN GLARGINE 100 UNIT/ML ~~LOC~~ SOLN
4.0000 [IU] | Freq: Once | SUBCUTANEOUS | Status: AC
  Administered 2013-01-05: 4 [IU] via SUBCUTANEOUS

## 2013-01-05 MED ORDER — INSULIN PEN NEEDLE 29G X 13MM MISC: Status: DC

## 2013-01-05 MED ORDER — POLYETHYLENE GLYCOL 3350 17 G PO PACK: 17.0000 g | PACK | Freq: Every day | ORAL | Status: DC | PRN

## 2013-01-05 MED ORDER — LISINOPRIL 10 MG PO TABS: 10.0000 mg | ORAL_TABLET | Freq: Every day | ORAL | Status: DC

## 2013-01-05 MED ORDER — INSULIN GLARGINE 100 UNIT/ML ~~LOC~~ SOLN: 28.0000 [IU] | Freq: Every day | SUBCUTANEOUS | Status: DC

## 2013-01-05 MED ORDER — HYDROCORTISONE 1 % EX CREA: TOPICAL_CREAM | Freq: Two times a day (BID) | CUTANEOUS | Status: DC

## 2013-01-05 MED ORDER — BLOOD GLUCOSE METER KIT: PACK | Status: DC

## 2013-01-05 MED ORDER — DSS 100 MG PO CAPS: 100.0000 mg | ORAL_CAPSULE | Freq: Two times a day (BID) | ORAL | Status: DC

## 2013-01-05 NOTE — Discharge Summary (Addendum)
[facility]  Physician Discharge Summary   Patient ID: Christopher Douglas MRN: [id number] DOB/AGE: 04/19/1991 22 y.o.  Admit date: 01/03/2013 Discharge date: 01/05/2013  PCP: Patient will establish with a PCP  DISCHARGE DIAGNOSES:  Active Problems:   Asthma   DM type 1 (diabetes mellitus, type 1)   Unspecified constipation   RECOMMENDATIONS FOR OUTPATIENT FOLLOW UP: 1. Newly diagnosed DM. Will need close follow up.  DISCHARGE CONDITION: fair  Diet recommendation: Mod Carb  Filed Weights   01/03/13 2328  Weight: 116.3 kg (256 lb 6.3 oz)    INITIAL HISTORY: Christopher Douglas is a 22 y.o. male with history of asthma was experiencing polyuria and polydipsia with fatigue over the last 2-3 weeks. He checked his blood sugar with help of his girlfriend who is a Engineer, civil (consulting) and was found to have a high blood sugar and was unrecordable in the glucometer. Patient was brought to the ER and labs repeat high blood sugar and patient does not have history of diabetes. Patient otherwise denied any chest pain, shortness of breath, nausea, vomiting or diarrhea. He's been feeling fatigued with some weight loss over the last few months. Labs reveal mildly elevated anion gap patient has been started on IV insulin infusion. Patient was admitted for DKA.  Consultations:  None  Procedures:  None  HOSPITAL COURSE:   DKA with most likely newly diagnosed DM 1  Patient was initiated on intravenous insulin. His anion gap closed. He was transitioned to Lantus. Dose will be adjusted today. He has been tolerating his diet. He was seen by diabetes educator and dietitian. Patient will get established with a PCP. HBA1c was 17.6.   Elevated Blood Pressure No known hypertension. I have asked him to check BP at home and if it remains elevated he will benefit from ACEI considering diabetes. Prescription provided without refills.  Hypokalemia  This was repleted. Levels have improved.   Constipation  TSH is normal.  Stool softeners. Prep H for pain in anal area due to straining.   History of Asthma  Stable. Presently not wheezing. Continue albuterol when necessary.   Tobacco abuse  Patient states he quit last month. He was congratulated and encouraged to stay off.  Patient feels better. He is ready for discharge. He has been told the importance of following up with a PCP ASAP.   PERTINENT LABS:  The results of significant diagnostics from this hospitalization (including imaging, microbiology, ancillary and laboratory) are listed below for reference.    Microbiology: Recent Results (from the past 240 hour(s))  MRSA PCR SCREENING     Status: None   Collection Time    01/03/13 11:13 PM      Result Value Range Status   MRSA by PCR NEGATIVE  NEGATIVE Final   Comment:            The GeneXpert MRSA Assay (FDA     approved for NASAL specimens     only), is one component of a     comprehensive MRSA colonization     surveillance program. It is not     intended to diagnose MRSA     infection nor to guide or     monitor treatment for     MRSA infections.     Labs: Basic Metabolic Panel:  Recent Labs Lab 01/04/13 0120 01/04/13 0330 01/04/13 0530 01/04/13 1407 01/05/13 0605  NA 137 134* 135 129* 133*  K 3.0* 2.8* 2.9* 3.8 3.7  CL 99 97 99 91* 99  CO2 29 26 27 22 20   GLUCOSE 217* 182* 152* 393* 257*  BUN 5* 5* 5* 8 5*  CREATININE 0.80 0.78 0.84 0.87 0.73  CALCIUM 8.7 8.4 8.4 8.8 8.8  MG  --   --  1.8  --   --    Liver Function Tests:  Recent Labs Lab 01/03/13 1800  AST 12  ALT 14  ALKPHOS 117  BILITOT 0.4  PROT 7.9  ALBUMIN 4.2   CBC:  Recent Labs Lab 01/03/13 1800 01/03/13 2359 01/04/13 0530  WBC 9.1 8.4 9.1  HGB 14.3 13.1 12.1*  HCT 42.7 37.1* 36.4*  MCV 83.9 81.4 82.7  PLT 220 179 177   Cardiac Enzymes:  Recent Labs Lab 01/03/13 2328  TROPONINI <0.30   CBG:  Recent Labs Lab 01/04/13 0748 01/04/13 1202 01/04/13 1720 01/04/13 2143 01/05/13 0733   GLUCAP 176* 286* 265* 223* 225*     IMAGING STUDIES No results found.  DISCHARGE EXAMINATION: Filed Vitals:   01/04/13 2200 01/05/13 0200 01/05/13 0600 01/05/13 0952  BP: 122/77 164/76 145/79 158/72  Pulse: 94 64 84 101  Temp: 98.4 F (36.9 C) 97.9 F (36.6 C) 97.8 F (36.6 C) 98.2 F (36.8 C)  TempSrc: Oral Oral Oral Oral  Resp: 20 18 18 18   Height:      Weight:      SpO2: 100% 99% 100% 98%   General appearance: alert, cooperative, appears stated age and no distress Resp: clear to auscultation bilaterally Cardio: regular rate and rhythm, S1, S2 normal, no murmur, click, rub or gallop GI: soft, non-tender; bowel sounds normal; no masses,  no organomegaly Neurologic: Alert and oriented X 3, normal strength and tone. Normal symmetric reflexes. Normal coordination and gait  DISPOSITION: Home  Discharge Orders   Future Orders Complete By Expires     Ambulatory referral to Nutrition and Diabetic Education  As directed     Comments:      Patient with new onset diabetes.  May likely have Type 1 Diabetes.  C-peptide 0.1 ng/ml.  To be discharged on Lantus and Novolog pens.  A1c pending.  Employee of [employer]. Can get Rxs at Outpatient pharmacy.  Needs 1:1 session with CDE.  Phone number: [phone] Thank you!    Patient: Please call the [facility] after discharge to schedule an appointment for diabetes education if you do not hear from the center before discharge  [phone]    Diet Carb Modified  As directed     Discharge instructions  As directed     Comments:      1. Check your blood pressure 2-3 times this week. If BP remains above 145/70 start taking the Lisinopril. 2. Be sure to establish with a primary care provider soon. 3. Check your blood sugars three times a day (before each meal) and write them down in a diary for your doctor.    Increase activity slowly  As directed       Current Discharge Medication List    START  taking these medications   Details  Blood Glucose Monitoring Suppl (BLOOD GLUCOSE METER) kit Use as instructed Qty: 1 each, Refills: 0    docusate sodium 100 MG CAPS Take 100 mg by mouth 2 (two) times daily. Qty: 60 capsule, Refills: 0    hydrocortisone cream (PREPARATION H HYDROCORTISONE) 1 % Apply topically 2 (two) times daily. Qty: 30 g, Refills: 0    insulin aspart (NOVOLOG FLEXPEN) 100 UNIT/ML injection Inject  under the skin 3 times daily depending on blood sugar as follows:  Inject 0-8 Units into the skin 3 (three) times daily with meals. Correction coverage: Moderate CBG < 70: Dont take insulin and call your doctor CBG 70 - 120: 0 units CBG 121 - 150: 2 units CBG 151 - 200: 3 units CBG 201 - 250: 5 units CBG 251 - 300: 8 units  For CBG greater than 300 call your doctor Qty: 1 pen, Refills: 12    insulin glargine (LANTUS SOLOSTAR) 100 UNIT/ML injection Inject 28 Units into the skin daily. Starting 01/06/13 morning (10Am) Qty: 1 pen, Refills: 12    Insulin Pen Needle 29G X MISC Use as instructed Qty: 30 each, Refills: 3    lisinopril (PRINIVIL,ZESTRIL) 10 MG tablet Take 1 tablet (10 mg total) by mouth daily. Qty: 30 tablet, Refills: 0    polyethylene glycol (MIRALAX / GLYCOLAX) packet Take 17 g by mouth daily as needed (constipation). Qty: 14 each, Refills: 0      CONTINUE these medications which have NOT CHANGED   Details  albuterol (PROVENTIL HFA;VENTOLIN HFA) 108 (90 BASE) MCG/ACT inhaler Inhale 1-2 puffs into the lungs every 6 (six) hours as needed for wheezing. Qty: 1 Inhaler, Refills: 0    ibuprofen (ADVIL,MOTRIN) 200 MG tablet Take 600 mg by mouth every 6 (six) hours as needed for pain.    loratadine (CLARITIN) 10 MG tablet Take 1 tablet (10 mg total) by mouth daily. Qty: 15 tablet, Refills: 0       Follow-up Information   Follow up with Primary Care Physician. Schedule an appointment as soon as possible for a visit in 1 week.      TOTAL DISCHARGE  TIME: 35 mins  [facility]  [facility] Pager [phone]  01/05/2013, 11:31 AM

## 2013-01-05 NOTE — Progress Notes (Signed)
Went over [employer] instructions with patient.  Patient had demonstrated several times during shift that he is able to pull up insulin and administer with correct technique.  Discussed diet and foods to generally be cautious of such as candy, regular sodas, sugar cereals and fruit juice as these will likely cause his sugar to rise.  Patient states he intends to go for outpatient education which I  Encouraged him to do.  I answered all of patients questions.  Rx given for meter, strips, and insulin pens.  Encouraged patient that he could call us here at [facility] if he had any questions over the weekend.  Patient walked to front of hospital accompanied by friend.

## 2013-01-06 ENCOUNTER — Ambulatory Visit (INDEPENDENT_AMBULATORY_CARE_PROVIDER_SITE_OTHER): Payer: 59 | Admitting: Internal Medicine

## 2013-01-06 VITALS — BP 143/82 | HR 102 | Temp 98.4°F | Resp 20 | Ht 74.0 in | Wt 245.0 lb

## 2013-01-06 DIAGNOSIS — E119 Type 2 diabetes mellitus without complications: Secondary | ICD-10-CM

## 2013-01-06 DIAGNOSIS — K612 Anorectal abscess: Secondary | ICD-10-CM

## 2013-01-06 DIAGNOSIS — K5900 Constipation, unspecified: Secondary | ICD-10-CM

## 2013-01-06 DIAGNOSIS — K610 Anal abscess: Secondary | ICD-10-CM

## 2013-01-06 MED ORDER — CIPROFLOXACIN HCL 500 MG PO TABS: 500.0000 mg | ORAL_TABLET | Freq: Two times a day (BID) | ORAL | Status: DC

## 2013-01-06 NOTE — Progress Notes (Signed)
  Subjective:    Patient ID: Christopher Douglas, male    DOB: 20-Dec-1990, 22 y.o.   MRN: [id number]  HPI first  [facility] OV just discharged from hospitalization for initial presentation with diabetic ketoacidosis. Diagnosis type I. Patient takes lantus once a day and regular insulin 3 or 4 times a day as he follows blood sugars.    Has had constipation for awhile.  Having difficulty having a bowel movement 3-4 weeks. Pain with sitting felt in the rectal area started w/ hospital. Was told in the hospital to check with primary care provider. Has had night sweats. Unsure of fever. Small amount of bloody discharge on TP today.  Hx Asthma- is stable.   fam hx= Mother is borderline diabetic but maternal grandmother and grandfather have diabetes.  Believes that he has had symptoms for years but was overlooked.   Review of Systems Recent weight loss as noted No fatigue No abdominal pain No dysuria or frequency or discharge    Objective:   Physical Exam BP 143/82  Pulse 102  Temp(Src) 98.4 F (36.9 C) (Oral)  Resp 20  Ht 6\' 2"  (1.88 m)  Wt 245 lb (111.131 kg)  BMI 31.44 kg/m2  SpO2 100% Obviously uncomfortable Abd-soft, nontender, no organomegaly or masses Rectal exam reveals a large, 6 cm x 3 cm perianal abscess on the right side extending towards the right testicle/but not involving the right testicle With pressure it immediately drains/estimated 20 cc of purulent discharge/1cm opening created Rectal exam shows no peri Rectal dissection, tenderness, mass, fissure, or hemorrhoids   White blood cells not elevated in the hospital     Assessment & Plan:  Perianal abscess Type 1 diabetes new onset  Cipro 500 twice a day #20 Culture Recheck 4 days/sooner if worse Referral to endocrinology Dr. Romero Belling to consider insulin pump

## 2013-01-07 ENCOUNTER — Encounter: Payer: Self-pay | Admitting: Internal Medicine

## 2013-01-08 LAB — WOUND CULTURE

## 2013-01-08 LAB — INSULIN ANTIBODIES, BLOOD: Insulin Antibodies, Human: <0.4 U/mL (ref <0.4)

## 2013-01-11 ENCOUNTER — Ambulatory Visit (INDEPENDENT_AMBULATORY_CARE_PROVIDER_SITE_OTHER): Payer: 59 | Admitting: Endocrinology

## 2013-01-11 VITALS — BP 140/80 | HR 80 | Wt 257.0 lb

## 2013-01-11 DIAGNOSIS — E109 Type 1 diabetes mellitus without complications: Secondary | ICD-10-CM

## 2013-01-11 NOTE — Patient Instructions (Addendum)
good diet and exercise habits significanly improve the control of your diabetes.  please let me know if you wish to be referred to a dietician.  high blood sugar is very risky to your health.  you should see an eye doctor every year.  You are at higher than average risk for pneumonia and hepatitis-B.  You should be vaccinated against both.   controlling your blood pressure and cholesterol drastically reduces the damage diabetes does to your body.  this also applies to quitting smoking.  please discuss these with your doctor.  you should take an aspirin every day, unless you have been advised by a doctor not to.  check your blood sugar 4 times a day--before the 3 meals, and at bedtime.  also check if you have symptoms of your blood sugar being too high or too low.  please keep a record of the readings and bring it to your next appointment here.  please call us sooner if your blood sugar goes below 70, or if you have a lot of readings over 200.   Please continue the same lantus.   Take novolog, 8 units 3 times a day (just before each meal).   Please let me know if you decide to pursue the insulin pump and/or continuous glucose monitor.   Please come back for a follow-up appointment in 2 weeks.

## 2013-01-11 NOTE — Progress Notes (Signed)
Subjective:    Patient ID: Christopher Douglas, male    DOB: 20-Jun-1991, 22 y.o.   MRN: [id number]  HPI Pt was dx'ed with DM 1 week ago, when he presented with DKA and severe hyperglycemia.  He had assoc weight loss, throughout the body.  He was started on insulin, and he feels much better now.  He says cbg's vary from 180-305.  He averages a total of approx 18 units of novolog per day.  He also takes lantus, qd.   Past Medical History  Diagnosis Date  . Asthma   . Diabetes mellitus without complication   . Hypertension     Past Surgical History  Procedure Laterality Date  . Foot surgery      History   Social History  . Marital Status: Single    Spouse Name: N/A    Number of Children: N/A  . Years of Education: N/A   Occupational History  . Not on file.   Social History Main Topics  . Smoking status: Former Smoker -- 5 years    Quit date: 12/06/2012  . Smokeless tobacco: Not on file  . Alcohol Use: 0.0 oz/week    1-2 Cans of beer per week     Comment: occassional  . Drug Use: No  . Sexually Active: Yes    Birth Control/ Protection: Condom   Other Topics Concern  . Not on file   Social History Narrative  . No narrative on file    Current Outpatient Prescriptions on File Prior to Visit  Medication Sig Dispense Refill  . albuterol (PROVENTIL HFA;VENTOLIN HFA) 108 (90 BASE) MCG/ACT inhaler Inhale 1-2 puffs into the lungs every 6 (six) hours as needed for wheezing.  1 Inhaler  0  . Blood Glucose Monitoring Suppl (BLOOD GLUCOSE METER) kit Use as instructed  1 each  0  . ciprofloxacin (CIPRO) 500 MG tablet Take 1 tablet (500 mg total) by mouth 2 (two) times daily.  20 tablet  0  . docusate sodium 100 MG CAPS Take 100 mg by mouth 2 (two) times daily.  60 capsule  0  . hydrocortisone cream (PREPARATION H HYDROCORTISONE) 1 % Apply topically 2 (two) times daily.  30 g  0  . ibuprofen (ADVIL,MOTRIN) 200 MG tablet Take 600 mg by mouth every 6 (six) hours as needed for pain.      Marland Kitchen  insulin aspart (NOVOLOG FLEXPEN) 100 UNIT/ML injection Inject under the skin 3 times daily depending on blood sugar as follows:  Inject 0-8 Units into the skin 3 (three) times daily with meals. Correction coverage: Moderate CBG < 70: Dont take insulin and call your doctor CBG 70 - 120: 0 units CBG 121 - 150: 2 units CBG 151 - 200: 3 units CBG 201 - 250: 5 units CBG 251 - 300: 8 units  For CBG greater than 300 call your doctor  1 pen  12  . insulin glargine (LANTUS SOLOSTAR) 100 UNIT/ML injection Inject 28 Units into the skin daily. Starting 01/06/13 morning (10Am)  1 pen  12  . Insulin Pen Needle 29G X MISC Use as instructed  30 each  3  . lisinopril (PRINIVIL,ZESTRIL) 10 MG tablet Take 1 tablet (10 mg total) by mouth daily.  30 tablet  0  . polyethylene glycol (MIRALAX / GLYCOLAX) packet Take 17 g by mouth daily as needed (constipation).  14 each  0  . loratadine (CLARITIN) 10 MG tablet Take 1 tablet (10 mg total) by mouth daily.  15 tablet  0   No current facility-administered medications on file prior to visit.    Allergies  Allergen Reactions  . Amoxicillin Hives    Breaks out in hives  . Shellfish Allergy     Specifically shrimp    Family History  Problem Relation Age of Onset  . Diabetes Mellitus II Father   . Hypertension Father   . Alcohol abuse Sister   . Alcohol abuse Brother   . Diabetes Maternal Grandmother   . Diabetes Maternal Grandfather   . Hypertension Maternal Grandfather     BP 140/80  Pulse 80  Wt 257 lb (116.574 kg)  BMI 32.98 kg/m2  SpO2 98%    Review of Systems denies fever, headache, chest pain, sob, n/v, urinary frequency, cramps, excessive diaphoresis, memory loss, depression, hypoglycemia, rhinorrhea, and easy bruising.  He has blurry vision.      Objective:   Physical Exam VS: see vs page GEN: no distress HEAD: head: no deformity eyes: no periorbital swelling, no proptosis external nose and ears are normal mouth: no lesion  seen NECK: supple, thyroid is not enlarged CHEST WALL: no deformity LUNGS: clear to auscultation BREASTS:  No gynecomastia CV: reg rate and rhythm, no murmur ABD: abdomen is soft, nontender.  no hepatosplenomegaly.  not distended.  no hernia MUSCULOSKELETAL: muscle bulk and strength are grossly normal.  no obvious joint swelling.  gait is normal and steady EXTEMITIES: no deformity.  no ulcer on the feet.  feet are of normal color and temp.  no edema PULSES: dorsalis pedis intact bilat.  no carotid bruit NEURO:  cn 2-12 grossly intact.   readily moves all 4's.  sensation is intact to touch on the feet SKIN:  Normal texture and temperature.  No rash or suspicious lesion is visible.   NODES:  None palpable at the neck PSYCH: alert, oriented x3.  Does not appear anxious nor depressed.  Lab Results  Component Value Date   HGBA1C 17.6* 01/04/2013      Assessment & Plan:  DM, new, probably type 1 DM, but atypical DM is also possible.  He needs increased rx Weight loss, prob due to DM. Blurry vision, prob due to severe hyperglycemia

## 2013-01-13 ENCOUNTER — Ambulatory Visit (INDEPENDENT_AMBULATORY_CARE_PROVIDER_SITE_OTHER): Payer: 59 | Admitting: Internal Medicine

## 2013-01-13 VITALS — BP 109/66 | HR 83 | Temp 98.8°F | Resp 16 | Ht 75.0 in | Wt 259.8 lb

## 2013-01-13 DIAGNOSIS — E119 Type 2 diabetes mellitus without complications: Secondary | ICD-10-CM

## 2013-01-13 NOTE — Progress Notes (Signed)
  Subjective:    Patient ID: Christopher Douglas, male    DOB: May 28, 1991, 22 y.o.   MRN: [id number]  HPI IDDM scheduled to see endocrine 2 weeks. Perianal abscess grew strep galatiae and cipro and drainage has made it better. Home glucose 190  Review of Systems     Objective:   Physical Exam  Vitals reviewed. Constitutional: He appears well-developed and well-nourished. No distress.  Pulmonary/Chest: Effort normal.  Genitourinary: Rectal exam shows tenderness.  Induration, erythema and and discharge resolved   Appears well       Assessment & Plan:  Perianal infection healing nicely IDDM stable and has endocrine appt.

## 2013-01-13 NOTE — Patient Instructions (Signed)

## 2013-01-25 ENCOUNTER — Ambulatory Visit: Payer: 59 | Admitting: Endocrinology

## 2013-02-11 ENCOUNTER — Ambulatory Visit: Payer: 59 | Admitting: Internal Medicine

## 2013-02-11 DIAGNOSIS — Z0289 Encounter for other administrative examinations: Secondary | ICD-10-CM

## 2013-02-14 ENCOUNTER — Ambulatory Visit: Payer: 59 | Admitting: *Deleted

## 2013-02-28 ENCOUNTER — Encounter: Payer: Self-pay | Admitting: *Deleted

## 2013-02-28 ENCOUNTER — Encounter: Payer: 59 | Attending: Endocrinology | Admitting: *Deleted

## 2013-02-28 VITALS — Ht 75.0 in | Wt 272.7 lb

## 2013-02-28 DIAGNOSIS — E119 Type 2 diabetes mellitus without complications: Secondary | ICD-10-CM | POA: Insufficient documentation

## 2013-02-28 DIAGNOSIS — Z713 Dietary counseling and surveillance: Secondary | ICD-10-CM | POA: Insufficient documentation

## 2013-02-28 DIAGNOSIS — E109 Type 1 diabetes mellitus without complications: Secondary | ICD-10-CM

## 2013-02-28 NOTE — Patient Instructions (Signed)
Plan:  Aim for 5 Carb Choices per meal (75 grams) +/- 1 either way  Aim for 0-2 Carbs per snack if hungry  Consider reading food labels for Total Carbohydrate of foods Continue with your current level daily as tolerated Continue checking BG at alternate times per day as directed by MD

## 2013-02-28 NOTE — Progress Notes (Signed)
  Medical Nutrition Therapy:  Appt start time: 1600 end time:  1700.  Assessment:  Primary concerns today: patient here for diabetes. Lives alone. 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 Enjoys movies, being with friends. Works out twice a week at home, walks the track. SMBG twice a day, pre breakfast and after supper. Reported range is 117 and 145. A1c in hospital was 17.6%, POCT A1c on 02/21/13 = 12.7%.  MEDICATIONS: see list   DIETARY INTAKE: Usual eating pattern includes 3 meals and 2 snacks per day.  Everyday foods include fair variety of all food groups.  Avoided foods include. None stated.    24-hr recall:  B ( AM): skips often and grabs a cereal bar and water  Snk ( AM): PNB crackers OR peanuts OR baked chips L ( PM): buys usually, is at school: fast food or subway: 6" chicken sub with cookie and chips, OR cheeseburger, fries, diet or regular soda Snk ( PM): same as AM D ( PM): prepares his own dinner; meat, starch, vegetables, salad, tea with Splenda or light lemonade Snk ( PM): not usually Beverages: water, diet or regular soda, tea with Splenda  Usual physical activity: work out at home and walk and run on track  Estimated energy needs: 2400 calories 270 g carbohydrates 180 g protein 67 g fat  Progress Towards Goal(s):  In progress.   Nutritional Diagnosis:  NB-1.1 Food and nutrition-related knowledge deficit As related to new diagnosis of diabetes.  As evidenced by A1c of 12.7%    Intervention:  Nutrition counseling and diabetes education initiated. Discussed basic physiology of diabetes, SMBG and rationale of checking BG at alternate times of day, A1c, Carb Counting and reading food labels, and benefits of increased activity. Pointed out the concentrated carb content of regular sodas and how that relates to his goal of 5 Carb choices per meal. Plan to  discuss insulin action at next visit in more detail  . Plan:  Aim for 5 Carb Choices per meal (75 grams) +/- 1 either way  Aim for 0-2 Carbs per snack if hungry  Consider reading food labels for Total Carbohydrate of foods Continue with your current level daily as tolerated Continue checking BG at alternate times per day as directed by MD   Handouts given during visit include: Living Well with Diabetes Carb Counting and Food Label handouts Meal Plan Card  Monitoring/Evaluation:  Dietary intake, exercise, SMBG, and body weight in 5 week(s).

## 2013-03-09 ENCOUNTER — Telehealth: Payer: Self-pay | Admitting: Endocrinology

## 2013-03-09 NOTE — Telephone Encounter (Signed)
please call patient: Ov is due 

## 2013-03-13 NOTE — Telephone Encounter (Signed)
LM for pt to call for OV appt / Sherri S.

## 2013-03-19 NOTE — Telephone Encounter (Signed)
Tried to call patient again today as he has not returned our message left on 03/13/13. I got no answer and was unable to leave a message. His cell phone is not taking messages today. I will try a 3rd and final time to reach patient later this week / Roanna Raider

## 2013-03-26 NOTE — Telephone Encounter (Signed)
LM for pt to call for an appt, needs OV w/ Dr. Everardo All / Roanna Raider

## 2013-04-03 ENCOUNTER — Emergency Department (HOSPITAL_COMMUNITY)
Admission: EM | Admit: 2013-04-03 | Discharge: 2013-04-03 | Disposition: A | Discharge status: Home / Self Care | Payer: 59 | Attending: Emergency Medicine | Admitting: Emergency Medicine

## 2013-04-03 ENCOUNTER — Encounter (HOSPITAL_COMMUNITY): Payer: Self-pay | Admitting: *Deleted

## 2013-04-03 DIAGNOSIS — Y9302 Activity, running: Secondary | ICD-10-CM | POA: Insufficient documentation

## 2013-04-03 DIAGNOSIS — Y929 Unspecified place or not applicable: Secondary | ICD-10-CM | POA: Insufficient documentation

## 2013-04-03 DIAGNOSIS — IMO0002 Reserved for concepts with insufficient information to code with codable children: Secondary | ICD-10-CM | POA: Insufficient documentation

## 2013-04-03 DIAGNOSIS — Z87891 Personal history of nicotine dependence: Secondary | ICD-10-CM | POA: Insufficient documentation

## 2013-04-03 DIAGNOSIS — M545 Low back pain: Secondary | ICD-10-CM

## 2013-04-03 DIAGNOSIS — I10 Essential (primary) hypertension: Secondary | ICD-10-CM | POA: Insufficient documentation

## 2013-04-03 DIAGNOSIS — T148XXA Other injury of unspecified body region, initial encounter: Secondary | ICD-10-CM

## 2013-04-03 DIAGNOSIS — Y93A2 Activity, calisthenics: Secondary | ICD-10-CM | POA: Insufficient documentation

## 2013-04-03 DIAGNOSIS — E119 Type 2 diabetes mellitus without complications: Secondary | ICD-10-CM | POA: Insufficient documentation

## 2013-04-03 DIAGNOSIS — J45909 Unspecified asthma, uncomplicated: Secondary | ICD-10-CM | POA: Insufficient documentation

## 2013-04-03 DIAGNOSIS — Z79899 Other long term (current) drug therapy: Secondary | ICD-10-CM | POA: Insufficient documentation

## 2013-04-03 DIAGNOSIS — T733XXA Exhaustion due to excessive exertion, initial encounter: Secondary | ICD-10-CM | POA: Insufficient documentation

## 2013-04-03 DIAGNOSIS — S335XXA Sprain of ligaments of lumbar spine, initial encounter: Secondary | ICD-10-CM | POA: Insufficient documentation

## 2013-04-03 MED ORDER — IBUPROFEN 800 MG PO TABS: 800.0000 mg | ORAL_TABLET | Freq: Three times a day (TID) | ORAL | Status: DC

## 2013-04-03 MED ORDER — METHOCARBAMOL 500 MG PO TABS: 500.0000 mg | ORAL_TABLET | Freq: Two times a day (BID) | ORAL | Status: DC

## 2013-04-03 NOTE — ED Provider Notes (Signed)
History     CSN: [id number]  Arrival date & time 04/03/13  1144   First MD Initiated Contact with Patient 04/03/13 1158      Chief Complaint  Patient presents with  . Back Pain    (Consider location/radiation/quality/duration/timing/severity/associated sxs/prior treatment) HPI  22 year old male presents complaining of low back pain. Patient states he is starting to exercise again. He was doing a lot of stretching exercise and running yesterday when he noticed a gradual onset of tightness and pain to his left low back. Pain is nonradiating, worsening with movement especially bending over. Pain is been worsened this morning. He has tried using warm bath, taking BC powder, and sitting in a chair massage with minimal relief. He denies fever, rash, urinary or bowel incontinence, saddle paresthesia, numbness, weakness.  Past Medical History  Diagnosis Date  . Asthma   . Diabetes mellitus without complication   . Hypertension     Past Surgical History  Procedure Laterality Date  . Foot surgery      Family History  Problem Relation Age of Onset  . Diabetes Mellitus II Father   . Hypertension Father   . Alcohol abuse Sister   . Alcohol abuse Brother   . Diabetes Maternal Grandmother   . Diabetes Maternal Grandfather   . Hypertension Maternal Grandfather     History  Substance Use Topics  . Smoking status: Former Smoker -- 5 years    Quit date: 12/06/2012  . Smokeless tobacco: Not on file  . Alcohol Use: 0.0 oz/week    1-2 Cans of beer per week     Comment: occassional      Review of Systems  Constitutional: Negative for fever.  Genitourinary: Negative for dysuria, flank pain and testicular pain.  Musculoskeletal: Positive for back pain. Negative for gait problem.  Skin: Negative for rash and wound.  Neurological: Negative for numbness.    Allergies  Amoxicillin and Shellfish allergy  Home Medications   Current Outpatient Rx  Name  Route  Sig  Dispense   Refill  . albuterol (PROVENTIL HFA;VENTOLIN HFA) 108 (90 BASE) MCG/ACT inhaler   Inhalation   Inhale 1-2 puffs into the lungs every 6 (six) hours as needed for wheezing.   1 Inhaler   0   . Blood Glucose Monitoring Suppl (BLOOD GLUCOSE METER) kit      Use as instructed   1 each   0   . ciprofloxacin (CIPRO) 500 MG tablet   Oral   Take 1 tablet (500 mg total) by mouth 2 (two) times daily.   20 tablet   0   . docusate sodium 100 MG CAPS   Oral   Take 100 mg by mouth 2 (two) times daily.   60 capsule   0   . hydrocortisone cream (PREPARATION H HYDROCORTISONE) 1 %   Topical   Apply topically 2 (two) times daily.   30 g   0   . ibuprofen (ADVIL,MOTRIN) 200 MG tablet   Oral   Take 600 mg by mouth every 6 (six) hours as needed for pain.         Marland Kitchen insulin aspart (NOVOLOG) 100 UNIT/ML injection   Subcutaneous   Inject 8 Units into the skin 3 (three) times daily before meals.         . insulin glargine (LANTUS SOLOSTAR) 100 UNIT/ML injection   Subcutaneous   Inject 28 Units into the skin daily. Starting 01/06/13 morning (10Am)   1 pen   12   .  Insulin Pen Needle 29G X MISC      Use as instructed   30 each   3   . lisinopril (PRINIVIL,ZESTRIL) 10 MG tablet   Oral   Take 1 tablet (10 mg total) by mouth daily.   30 tablet   0   . EXPIRED: loratadine (CLARITIN) 10 MG tablet   Oral   Take 1 tablet (10 mg total) by mouth daily.   15 tablet   0   . polyethylene glycol (MIRALAX / GLYCOLAX) packet   Oral   Take 17 g by mouth daily as needed (constipation).   14 each   0     BP 131/67  Pulse 88  Temp(Src) 98.3 F (36.8 C) (Oral)  Resp 16  Ht 6\' 3"  (1.905 m)  Wt 265 lb (120.203 kg)  BMI 33.12 kg/m2  SpO2 99%  Physical Exam  Nursing note and vitals reviewed. Constitutional: He appears well-developed and well-nourished. No distress.  HENT:  Head: Atraumatic.  Eyes: Conjunctivae are normal.  Neck: Neck supple.  Abdominal: Soft.   Musculoskeletal: He exhibits tenderness (Tenderness and tightness left paralumbar region without significant midline spine tenderness, crepitus, or step-off. No CVA tenderness. No rash. Negative straight leg raise.). He exhibits no edema.  Neurological: He is alert.  Skin: Skin is warm. No rash noted.  Psychiatric: He has a normal mood and affect.    ED Course  Procedures (including critical care time)  12:07 PM Tenderness and tightness to L paralumbar region suggestive of muscle strain.  No red flags.  Recommend RICE therapy.  Will prescribe ibuprofen/robaxin as treatment.  Ortho referral as needed.    Labs Reviewed - No data to display No results found.   1. Muscle strain   2. Low back pain       MDM  BP 131/67  Pulse 88  Temp(Src) 98.3 F (36.8 C) (Oral)  Resp 16  Ht 6\' 3"  (1.905 m)  Wt 265 lb (120.203 kg)  BMI 33.12 kg/m2  SpO2 99%         Fayrene Helper, PA-C 04/03/13 1210

## 2013-04-03 NOTE — ED Notes (Signed)
Pt states was doing his usual workout yesterday, this morning having lower back pain/tightness, feels like he can't stretch his back out, hard to bend over d/t tightness in back.

## 2013-04-03 NOTE — ED Provider Notes (Signed)
Medical screening examination/treatment/procedure(s) were performed by non-physician practitioner and as supervising physician I was immediately available for consultation/collaboration. Shiah Berhow, MD, FACEP   Patriciann Becht L Abdon Petrosky, MD 04/03/13 1533 

## 2013-04-04 ENCOUNTER — Ambulatory Visit: Payer: 59 | Admitting: *Deleted

## 2013-04-05 NOTE — Telephone Encounter (Signed)
Pt needs OV. I have left messages x 2 requesting that pt call for an appt w/ Dr. Everardo All. Pt has never returned my call for an appointment,tent / Sherri S.

## 2013-04-05 NOTE — Telephone Encounter (Signed)
Pt needs OV, I have left messages x 2 for pt to call for an appt. To date, pt has never called for an appointment / Sherri S.

## 2013-08-14 ENCOUNTER — Ambulatory Visit (INDEPENDENT_AMBULATORY_CARE_PROVIDER_SITE_OTHER): Payer: 59 | Admitting: Endocrinology

## 2013-08-14 ENCOUNTER — Encounter: Payer: Self-pay | Admitting: Endocrinology

## 2013-08-14 VITALS — BP 126/70 | HR 74 | Ht 75.0 in | Wt 289.0 lb

## 2013-08-14 DIAGNOSIS — E109 Type 1 diabetes mellitus without complications: Secondary | ICD-10-CM

## 2013-08-14 MED ORDER — INSULIN GLARGINE 100 UNIT/ML ~~LOC~~ SOLN: 25.0000 [IU] | Freq: Every day | SUBCUTANEOUS | Status: DC

## 2013-08-14 MED ORDER — LISINOPRIL 10 MG PO TABS: 10.0000 mg | ORAL_TABLET | Freq: Every day | ORAL | Status: DC

## 2013-08-14 MED ORDER — INSULIN ASPART 100 UNIT/ML FLEXPEN: 8.0000 [IU] | PEN_INJECTOR | Freq: Three times a day (TID) | SUBCUTANEOUS | Status: DC

## 2013-08-14 MED ORDER — GLUCOSE BLOOD VI STRP: 1.0000 | ORAL_STRIP | Freq: Two times a day (BID) | Status: DC

## 2013-08-14 NOTE — Progress Notes (Signed)
Subjective:    Patient ID: Christopher Douglas, male    DOB: [birth year], 22 y.o.   MRN: [id number]  HPI Pt was dx'ed with DM in early 2014, when he presented with DKA and severe hyperglycemia; he had assoc weight loss, throughout the body.  He was started on insulin, and he feels much better since then. no cbg record, but states cbg's are frequently mildly low in the afternoon.  He takes the lantus in the morning.  pt states he feels well in general.  He says asthma is well-controlled.   Past Medical History  Diagnosis Date  . Asthma   . Diabetes mellitus without complication   . Hypertension     Past Surgical History  Procedure Laterality Date  . Foot surgery      History   Social History  . Marital Status: Single    Spouse Name: N/A    Number of Children: N/A  . Years of Education: N/A   Occupational History  . Not on file.   Social History Main Topics  . Smoking status: Former Smoker -- 5 years    Quit date: 12/06/2012  . Smokeless tobacco: Not on file  . Alcohol Use: 0.0 oz/week    1-2 Cans of beer per week     Comment: occassional  . Drug Use: No  . Sexual Activity: Yes    Birth Control/ Protection: Condom   Other Topics Concern  . Not on file   Social History Narrative  . No narrative on file    Current Outpatient Prescriptions on File Prior to Visit  Medication Sig Dispense Refill  . ibuprofen (ADVIL,MOTRIN) 800 MG tablet Take 1 tablet (800 mg total) by mouth 3 (three) times daily.  21 tablet  0  . Insulin Pen Needle 29G X MISC Use as instructed  30 each  3  . methocarbamol (ROBAXIN) 500 MG tablet Take 1 tablet (500 mg total) by mouth 2 (two) times daily.  20 tablet  0  . albuterol (PROVENTIL HFA;VENTOLIN HFA) 108 (90 BASE) MCG/ACT inhaler Inhale 1-2 puffs into the lungs every 6 (six) hours as needed for wheezing.  1 Inhaler  0   No current facility-administered medications on file prior to visit.    Allergies  Allergen Reactions  . Amoxicillin Hives    Breaks out in hives  . Shellfish Allergy     Specifically shrimp    Family History  Problem Relation Age of Onset  . Diabetes Mellitus II Father   . Hypertension Father   . Alcohol abuse Sister   . Alcohol abuse Brother   . Diabetes Maternal Grandmother   . Diabetes Maternal Grandfather   . Hypertension Maternal Grandfather    BP 126/70  Pulse 74  Ht 6\' 3"  (1.905 m)  Wt 289 lb (131.09 kg)  BMI 36.12 kg/m2  SpO2 97%  Review of Systems Denies LOC and weight change.      Objective:   Physical Exam VITAL SIGNS:  See vs page GENERAL: no distress  outside test results are reviewed: A1c=6.6 (june, 2014).   Lab Results  Component Value Date   HGBA1C 6.6* 08/14/2013      Assessment & Plan:  DM: This insulin regimen was chosen from multiple options, as it best matches his insulin to his changing requirements throughout the day.  The benefits of glycemic control must be weighed against the risks of hypoglycemia.   Weight loss: this helps control of DM Asthma: control of this facilitates exercise  rx of DM.

## 2013-08-14 NOTE — Patient Instructions (Addendum)
check your blood sugar 4 times a day--before the 3 meals, and at bedtime.  also check if you have symptoms of your blood sugar being too high or too low.  please keep a record of the readings and bring it to your next appointment here.  please call us sooner if your blood sugar goes below 70, or if you have a lot of readings over 200.   Blood and urine tests are being requested for you today.  We'll contact you with results.  Please come back for a follow-up appointment in 4 months.  Please call [phone], to get an appointment with a new PCP.

## 2013-08-15 LAB — MICROALBUMIN / CREATININE URINE RATIO
Creatinine,U: 245 mg/dL
Microalb Creat Ratio: 0.5 mg/g (ref 0.0–30.0)
Microalb, Ur: 1.3 mg/dL (ref 0.0–1.9)

## 2013-08-16 ENCOUNTER — Telehealth: Payer: Self-pay | Admitting: *Deleted

## 2013-08-16 NOTE — Telephone Encounter (Signed)
Called pt and advised him that his HgbA1c was great, 6.6. Dr Everardo All advised to please adjust your insulin as he discussed with you at your appt. Call if you have any questions.

## 2013-12-16 ENCOUNTER — Ambulatory Visit: Payer: 59 | Admitting: Endocrinology

## 2014-03-07 ENCOUNTER — Other Ambulatory Visit: Payer: Self-pay

## 2014-03-07 MED ORDER — INSULIN GLARGINE 100 UNIT/ML ~~LOC~~ SOLN: 25.0000 [IU] | Freq: Every day | SUBCUTANEOUS | Status: DC

## 2014-03-11 ENCOUNTER — Telehealth: Payer: Self-pay

## 2014-03-11 ENCOUNTER — Ambulatory Visit: Payer: 59 | Admitting: Endocrinology

## 2014-03-11 NOTE — Telephone Encounter (Signed)
Lantus rx sent on 03/07/2014.

## 2014-03-28 ENCOUNTER — Encounter: Payer: Self-pay | Admitting: Endocrinology

## 2014-03-28 ENCOUNTER — Ambulatory Visit (INDEPENDENT_AMBULATORY_CARE_PROVIDER_SITE_OTHER): Payer: 59 | Admitting: Endocrinology

## 2014-03-28 VITALS — BP 130/76 | HR 67 | Temp 97.8°F | Ht 75.0 in | Wt 290.0 lb

## 2014-03-28 DIAGNOSIS — E109 Type 1 diabetes mellitus without complications: Secondary | ICD-10-CM

## 2014-03-28 LAB — HEMOGLOBIN A1C: Hgb A1c MFr Bld: 7.5 % — ABNORMAL HIGH (ref 4.6–6.5)

## 2014-03-28 NOTE — Patient Instructions (Addendum)
check your blood sugar 4 times a day--before the 3 meals, and at bedtime.  also check if you have symptoms of your blood sugar being too high or too low.  please keep a record of the readings and bring it to your next appointment here.  please call us sooner if your blood sugar goes below 70, or if you have a lot of readings over 200.   Blood tests are requested for you today.  We'll contact you with results.   Please come back for a follow-up appointment in 3 months.

## 2014-03-28 NOTE — Progress Notes (Signed)
Subjective:    Patient ID: Christopher Douglas, male    DOB: 1991/08/01, 23 y.o.   MRN: [id number]  HPI Pt returns for f/u of type 1 DM  (early 2014, when he presented with DKA and severe hyperglycemia; he has mild if any neuropathy of the lower extremities; no known associated complications; he was started on insulin; he takes multiple daily injections).  Pt says he ran out of lantus for 1 week, 2 weeks ago.  no cbg record, but states on both insulins, cbg's vary from 130-160.   Past Medical History  Diagnosis Date  . Asthma   . Diabetes mellitus without complication   . Hypertension     Past Surgical History  Procedure Laterality Date  . Foot surgery      History   Social History  . Marital Status: Single    Spouse Name: N/A    Number of Children: N/A  . Years of Education: N/A   Occupational History  . Not on file.   Social History Main Topics  . Smoking status: Former Smoker -- 5 years    Quit date: 12/06/2012  . Smokeless tobacco: Not on file  . Alcohol Use: 0.0 oz/week    1-2 Cans of beer per week     Comment: occassional  . Drug Use: No  . Sexual Activity: Yes    Birth Control/ Protection: Condom   Other Topics Concern  . Not on file   Social History Narrative  . No narrative on file    Current Outpatient Prescriptions on File Prior to Visit  Medication Sig Dispense Refill  . glucose blood (ONETOUCH VERIO) test strip 1 each by Other route 2 (two) times daily. And lancets 2/day  100 each  12  . ibuprofen (ADVIL,MOTRIN) 800 MG tablet Take 1 tablet (800 mg total) by mouth 3 (three) times daily.  21 tablet  0  . insulin aspart (NOVOLOG FLEXPEN) 100 UNIT/ML SOPN FlexPen Inject 8 Units into the skin 3 (three) times daily with meals.  10 pen  3  . insulin glargine (LANTUS) 100 UNIT/ML injection Inject 0.25 mLs (25 Units total) into the skin at bedtime.  30 mL  0  . Insulin Pen Needle 29G X 13MM MISC Use as instructed  30 each  3  . lisinopril (PRINIVIL,ZESTRIL) 10 MG  tablet Take 1 tablet (10 mg total) by mouth daily.  30 tablet  0  . methocarbamol (ROBAXIN) 500 MG tablet Take 1 tablet (500 mg total) by mouth 2 (two) times daily.  20 tablet  0  . albuterol (PROVENTIL HFA;VENTOLIN HFA) 108 (90 BASE) MCG/ACT inhaler Inhale 1-2 puffs into the lungs every 6 (six) hours as needed for wheezing.  1 Inhaler  0   No current facility-administered medications on file prior to visit.    Allergies  Allergen Reactions  . Amoxicillin Hives    Breaks out in hives  . Shellfish Allergy     Specifically shrimp    Family History  Problem Relation Age of Onset  . Diabetes Mellitus II Father   . Hypertension Father   . Alcohol abuse Sister   . Alcohol abuse Brother   . Diabetes Maternal Grandmother   . Diabetes Maternal Grandfather   . Hypertension Maternal Grandfather     BP 130/76  Pulse 67  Temp(Src) 97.8 F (36.6 C) (Oral)  Ht 6\' 3"  (1.905 m)  Wt 290 lb (131.543 kg)  BMI 36.25 kg/m2  SpO2 97%  Review of Systems He  denies hypoglycemia and weight change.     Objective:   Physical Exam VITAL SIGNS:  See vs page GENERAL: no distress  Lab Results  Component Value Date   HGBA1C 7.5* 03/28/2014      Assessment & Plan:  noncompliance with insulin: improved Type 1 DM: considering the gap in insulin rx, this is well-controlled.

## 2014-06-23 ENCOUNTER — Ambulatory Visit (INDEPENDENT_AMBULATORY_CARE_PROVIDER_SITE_OTHER): Payer: 59 | Admitting: Family Medicine

## 2014-06-23 ENCOUNTER — Ambulatory Visit (INDEPENDENT_AMBULATORY_CARE_PROVIDER_SITE_OTHER): Payer: 59

## 2014-06-23 ENCOUNTER — Ambulatory Visit: Payer: 59

## 2014-06-23 VITALS — BP 128/78 | HR 79 | Temp 98.5°F | Resp 18 | Ht 75.0 in | Wt 292.0 lb

## 2014-06-23 DIAGNOSIS — M25531 Pain in right wrist: Secondary | ICD-10-CM

## 2014-06-23 DIAGNOSIS — M25539 Pain in unspecified wrist: Secondary | ICD-10-CM

## 2014-06-23 DIAGNOSIS — M79641 Pain in right hand: Secondary | ICD-10-CM

## 2014-06-23 DIAGNOSIS — T148XXA Other injury of unspecified body region, initial encounter: Secondary | ICD-10-CM

## 2014-06-23 DIAGNOSIS — M79609 Pain in unspecified limb: Secondary | ICD-10-CM

## 2014-06-23 DIAGNOSIS — E109 Type 1 diabetes mellitus without complications: Secondary | ICD-10-CM

## 2014-06-23 DIAGNOSIS — M7989 Other specified soft tissue disorders: Secondary | ICD-10-CM

## 2014-06-23 LAB — POCT GLYCOSYLATED HEMOGLOBIN (HGB A1C): Hemoglobin A1C: 8.1

## 2014-06-23 LAB — POCT SEDIMENTATION RATE: POCT SED RATE: 3 mm/h (ref 0–22)

## 2014-06-23 MED ORDER — TRAMADOL HCL 50 MG PO TABS: 50.0000 mg | ORAL_TABLET | Freq: Three times a day (TID) | ORAL | Status: DC | PRN

## 2014-06-23 MED ORDER — IBUPROFEN 600 MG PO TABS: 600.0000 mg | ORAL_TABLET | Freq: Three times a day (TID) | ORAL | Status: DC | PRN

## 2014-06-23 NOTE — Progress Notes (Signed)
Chief Complaint:  Chief Complaint  Patient presents with  . Edema    right arm x 2-3 weeks    HPI: Christopher Douglas is a 23 y.o. 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 He ntieced it before his new job duties.  He has numbiess and tingin in his hand when he makes a fist. He cannot make a full fist due to swelling ,  He has localized swelling in his thenar eminenece. He aslo has it in the palm of his wrist.  NO new injuries, no new meds. Described as an Aching pain, stifness, 6/10 pain, swelling, worse when he does not move it. He went into DKA and was found to have DM type 1 about 1 year ago and is worried it may be his DM.  DM Type 1 A1c was 7.5  Lab Results  Component Value Date   HGBA1C 8.1 06/23/2014   HGBA1C 7.5* 03/28/2014   HGBA1C 6.6* 08/14/2013   Lab Results  Component Value Date   MICROALBUR 1.3 08/14/2013   LDLCALC 111* 01/04/2013   CREATININE 0.95 06/23/2014     Past Medical History  Diagnosis Date  . Asthma   . Diabetes mellitus without complication   . Hypertension    Past Surgical History  Procedure Laterality Date  . Foot surgery     History   Social History  . Marital Status: Single    Spouse Name: N/A    Number of Children: N/A  . Years of Education: N/A   Social History Main Topics  . Smoking status: Former Smoker -- 5 years    Quit date: 12/06/2012  . Smokeless tobacco: None  . Alcohol Use: 0.0 oz/week    1-2 Cans of beer per week     Comment: occassional  . Drug Use: No  . Sexual Activity: Yes    Birth Control/ Protection: Condom   Other Topics Concern  . None   Social History Narrative  . None   Family History  Problem Relation Age of Onset  . Diabetes Mellitus II Father   . Hypertension Father   . Alcohol abuse Sister   . Alcohol abuse Brother   . Diabetes Maternal Grandmother   . Diabetes Maternal Grandfather   . Hypertension Maternal Grandfather     Allergies  Allergen Reactions  . Amoxicillin Hives    Breaks out in hives  . Shellfish Allergy     Specifically shrimp   Prior to Admission medications   Medication Sig Start Date End Date Taking? Authorizing Provider  glucose blood (ONETOUCH VERIO) test strip 1 each by Other route 2 (two) times daily. And lancets 2/day 08/14/13  Yes Romero Belling, MD  ibuprofen (ADVIL,MOTRIN) 800 MG tablet Take 1 tablet (800 mg total) by mouth 3 (three) times daily. 04/03/13  Yes Fayrene Helper, PA-C  insulin aspart (NOVOLOG FLEXPEN) 100 UNIT/ML SOPN FlexPen Inject 8 Units into the skin 3 (three) times daily with meals. 08/14/13  Yes Romero Belling, MD  insulin glargine (LANTUS) 100 UNIT/ML injection Inject 0.25 mLs (25 Units total) into the skin at bedtime. 03/07/14  Yes Romero Belling, MD  Insulin Pen Needle 29G X MISC Use as instructed 01/05/13  Yes Osvaldo Shipper, MD  lisinopril (PRINIVIL,ZESTRIL) 10 MG tablet Take 1 tablet (10 mg total) by mouth daily. 08/14/13  Yes Romero Belling, MD  methocarbamol (ROBAXIN) 500 MG tablet Take  1 tablet (500 mg total) by mouth 2 (two) times daily. 04/03/13  Yes Fayrene HelperBowie Tran, PA-C  albuterol (PROVENTIL HFA;VENTOLIN HFA) 108 (90 BASE) MCG/ACT inhaler Inhale 1-2 puffs into the lungs every 6 (six) hours as needed for wheezing. 04/13/12 04/13/13  Linna HoffJames D Kindl, MD     ROS: The patient denies fevers, chills, night sweats, unintentional weight loss, chest pain, palpitations, wheezing, dyspnea on exertion, nausea, vomiting, abdominal pain, dysuria, hematuria, melena, numbness, weakness, or tingling.  All other systems have been reviewed and were otherwise negative with the exception of those mentioned in the HPI and as above.    PHYSICAL EXAM: Filed Vitals:   06/23/14 1414  BP: 128/78  Pulse: 79  Temp: 98.5 F (36.9 C)  Resp: 18   Filed Vitals:   06/23/14 1414  Height: 6\' 3"  (1.905 m)  Weight: 292 lb (132.45 kg)   Body mass index is 36.5 kg/(m^2).  General: Alert, no acute  distress HEENT:  Normocephalic, atraumatic, oropharynx patent. EOMI, PERRLA Cardiovascular:  Regular rate and rhythm, no rubs murmurs or gallops.  No Carotid bruits, radial pulse intact. No pedal edema.  Respiratory: Clear to auscultation bilaterally.  No wheezes, rales, or rhonchi.  No cyanosis, no use of accessory musculature GI: No organomegaly, abdomen is soft and non-tender, positive bowel sounds.  No masses. Skin: No rashes. Neurologic: Facial musculature symmetric. Psychiatric: Patient is appropriate throughout our interaction. Lymphatic: No cervical lymphadenopathy Musculoskeletal: Gait intact.   LABS: Results for orders placed in visit on 03/28/14  HEMOGLOBIN A1C      Result Value Ref Range   Hemoglobin A1C 7.5 (*) 4.6 - 6.5 %     EKG/XRAY:   Primary read [facility]. No acute fractures or dislocation  Please comment if there is an old wrist fracture at distal radius.    ASSESSMENT/PLAN: Encounter Diagnoses  Name Primary?  . Hand pain, right Yes  . Wrist joint pain, right   . Sprain and strain   . Swelling of right hand   . Type 1 diabetes mellitus without complication    Wrist and hand pain with increase new job duties, however he states he has had this prior to the job, he is in [employer] and does a lot of jackknifing and lifting, ? Sprain and strain vs less likely related to his DM Thumb spica splint given Rx tramadol, ibuprofen Labs pending F/u prn  Gross sideeffects, risk and benefits, and alternatives of medications d/w patient. Patient is aware that all medications have potential sideeffects and we are unable to predict every sideeffect or drug-drug interaction that may occur.  Hamilton CapriLE, THAO PHUONG, DO 06/23/2014 5:32 PM

## 2014-06-24 LAB — COMPLETE METABOLIC PANEL WITHOUT GFR
ALT: 25 U/L (ref 0–53)
AST: 23 U/L (ref 0–37)
Alkaline Phosphatase: 57 U/L (ref 39–117)
GFR, Est Non African American: >89 mL/min
Glucose, Bld: 84 mg/dL (ref 70–99)
Sodium: 140 meq/L (ref 135–145)
Total Protein: 6.1 g/dL (ref 6.0–8.3)

## 2014-06-24 LAB — COMPLETE METABOLIC PANEL WITH GFR
Albumin: 4 g/dL (ref 3.5–5.2)
BUN: 5 mg/dL — ABNORMAL LOW (ref 6–23)
CO2: 28 mEq/L (ref 19–32)
Calcium: 9 mg/dL (ref 8.4–10.5)
Chloride: 104 mEq/L (ref 96–112)
Creat: 0.95 mg/dL (ref 0.50–1.35)
GFR, Est African American: >89 mL/min
Potassium: 3.6 mEq/L (ref 3.5–5.3)
Total Bilirubin: 0.3 mg/dL (ref 0.2–1.2)

## 2014-06-25 ENCOUNTER — Telehealth: Payer: Self-pay

## 2014-06-25 NOTE — Telephone Encounter (Signed)
Diabetic Bundle. Pt coming for OV on 8/10 to see Dr. Everardo AllEllison.

## 2014-06-30 ENCOUNTER — Ambulatory Visit: Payer: 59 | Admitting: Endocrinology

## 2014-06-30 DIAGNOSIS — Z0289 Encounter for other administrative examinations: Secondary | ICD-10-CM

## 2014-09-12 ENCOUNTER — Ambulatory Visit (INDEPENDENT_AMBULATORY_CARE_PROVIDER_SITE_OTHER): Payer: 59 | Admitting: Internal Medicine

## 2014-09-12 ENCOUNTER — Encounter: Payer: Self-pay | Admitting: Internal Medicine

## 2014-09-12 VITALS — BP 132/70 | HR 76 | Temp 97.9°F | Resp 16 | Ht 75.0 in | Wt 290.8 lb

## 2014-09-12 DIAGNOSIS — E108 Type 1 diabetes mellitus with unspecified complications: Secondary | ICD-10-CM

## 2014-09-12 DIAGNOSIS — Z299 Encounter for prophylactic measures, unspecified: Secondary | ICD-10-CM

## 2014-09-12 DIAGNOSIS — Z23 Encounter for immunization: Secondary | ICD-10-CM

## 2014-09-12 DIAGNOSIS — J4520 Mild intermittent asthma, uncomplicated: Secondary | ICD-10-CM

## 2014-09-12 DIAGNOSIS — Z418 Encounter for other procedures for purposes other than remedying health state: Secondary | ICD-10-CM

## 2014-09-12 MED ORDER — INSULIN GLARGINE 100 UNIT/ML SOLOSTAR PEN: 28.0000 [IU] | PEN_INJECTOR | Freq: Every day | SUBCUTANEOUS | Status: DC

## 2014-09-12 MED ORDER — INSULIN PEN NEEDLE 29G X 13MM MISC: Status: DC

## 2014-09-12 MED ORDER — LISINOPRIL 10 MG PO TABS: 10.0000 mg | ORAL_TABLET | Freq: Every day | ORAL | Status: DC

## 2014-09-12 MED ORDER — GLUCOSE BLOOD VI STRP: 1.0000 | ORAL_STRIP | Freq: Two times a day (BID) | Status: DC

## 2014-09-12 MED ORDER — INSULIN ASPART 100 UNIT/ML FLEXPEN: 12.0000 [IU] | PEN_INJECTOR | Freq: Three times a day (TID) | SUBCUTANEOUS | Status: DC

## 2014-09-12 MED ORDER — ALBUTEROL SULFATE HFA 108 (90 BASE) MCG/ACT IN AERS: 1.0000 | INHALATION_SPRAY | Freq: Four times a day (QID) | RESPIRATORY_TRACT | Status: DC | PRN

## 2014-09-12 NOTE — Progress Notes (Signed)
   Subjective:    Patient ID: Christopher Douglas, male    DOB: February 01, 1991, 23 y.o.   MRN: [id number]  HPI The patient is a 23 YO man who is coming in today to establish care. He has PMH of asthma and DM type 1 (diagnosed about 1-2 years ago). He was seeing the endocrinologist and will be going back to see them next week. He is currently out of some of his insulins and has noticed some high sugars at times (200s) and once he saw a 300 but generally he thinks they are 130-150 when he has both insulins. He denies low sugars. He denies much alcohol usage but occasionally will have a beer. He has not seen the eye doctor recently but is going to see them next week as well. He denies breathing problems and never uses the albuterol except sometimes in the winter with the dry air.   Review of Systems  Constitutional: Negative for fever, activity change, appetite change and fatigue.  HENT: Negative.   Eyes: Negative.   Respiratory: Negative for chest tightness, shortness of breath and wheezing.   Cardiovascular: Negative for chest pain, palpitations and leg swelling.  Gastrointestinal: Negative for abdominal pain, diarrhea, constipation and abdominal distention.  Endocrine: Negative for polydipsia, polyphagia and polyuria.  Musculoskeletal: Negative.   Skin: Negative.   Neurological: Negative for dizziness, weakness, light-headedness and numbness.      Objective:   Physical Exam  Vitals reviewed. Constitutional: He is oriented to person, place, and time. He appears well-developed and well-nourished. No distress.  HENT:  Head: Normocephalic and atraumatic.  Hair in dredlocks  Eyes: EOM are normal.  Neck: Normal range of motion.  Cardiovascular: Normal rate and regular rhythm.   Pulmonary/Chest: Effort normal and breath sounds normal. No respiratory distress. He has no wheezes. He has no rales.  Abdominal: Soft. Bowel sounds are normal. He exhibits no distension. There is no tenderness. There is no  rebound.  Neurological: He is alert and oriented to person, place, and time. Coordination normal.  Skin: Skin is warm and dry.   Filed Vitals:   09/12/14 0902  BP: 132/70  Pulse: 76  Temp: 97.9 F (36.6 C)  TempSrc: Oral  Resp: 16  Height: 6\' 3"  (1.905 m)  Weight: 290 lb 12.8 oz (131.906 kg)  SpO2: 97%      Assessment & Plan:  Tdap and Flu shot given today.

## 2014-09-12 NOTE — Patient Instructions (Signed)
We have sent in all your refills for the medicines and the insulin. With your diabetes it is really important to not run out of insulin and to call a doctor if you are going to run out.   Work on increasing your exercise as this will help keep the sugars better controlled. We are going to check on your blood work today for the diabetes and check your urine to make sure the kidneys are not being affected by the sugars.   We have given you the flu shot and the tetanus shot today.   Remember that alcohol can block the signs of low blood sugars and it is important to eat some food if you are drinking alcohol and check your sugars frequently.   We will see you back in about 3-6 months.   Diabetes and Exercise Exercising regularly is important. It is not just about losing weight. It has many health benefits, such as:  Improving your overall fitness, flexibility, and endurance.  Increasing your bone density.  Helping with weight control.  Decreasing your body fat.  Increasing your muscle strength.  Reducing stress and tension.  Improving your overall health. People with diabetes who exercise gain additional benefits because exercise:  Reduces appetite.  Improves the body's use of blood sugar (glucose).  Helps lower or control blood glucose.  Decreases blood pressure.  Helps control blood lipids (such as cholesterol and triglycerides).  Improves the body's use of the hormone insulin by:  Increasing the body's insulin sensitivity.  Reducing the body's insulin needs.  Decreases the risk for heart disease because exercising:  Lowers cholesterol and triglycerides levels.  Increases the levels of good cholesterol (such as high-density lipoproteins [HDL]) in the body.  Lowers blood glucose levels. YOUR ACTIVITY PLAN  Choose an activity that you enjoy and set realistic goals. Your health care provider or diabetes educator can help you make an activity plan that works for you.  Exercise regularly as directed by your health care provider. This includes:  Performing resistance training twice a week such as push-ups, sit-ups, lifting weights, or using resistance bands.  Performing 150 minutes of cardio exercises each week such as walking, running, or playing sports.  Staying active and spending no more than 90 minutes at one time being inactive. Even short bursts of exercise are good for you. Three 10-minute sessions spread throughout the day are just as beneficial as a single 30-minute session. Some exercise ideas include:  Taking the dog for a walk.  Taking the stairs instead of the elevator.  Dancing to your favorite song.  Doing an exercise video.  Doing your favorite exercise with a friend. RECOMMENDATIONS FOR EXERCISING WITH TYPE 1 OR TYPE 2 DIABETES   Check your blood glucose before exercising. If blood glucose levels are greater than 240 mg/dL, check for urine ketones. Do not exercise if ketones are present.  Avoid injecting insulin into areas of the body that are going to be exercised. For example, avoid injecting insulin into:  The arms when playing tennis.  The legs when jogging.  Keep a record of:  Food intake before and after you exercise.  Expected peak times of insulin action.  Blood glucose levels before and after you exercise.  The type and amount of exercise you have done.  Review your records with your health care provider. Your health care provider will help you to develop guidelines for adjusting food intake and insulin amounts before and after exercising.  If you take insulin or  oral hypoglycemic agents, watch for signs and symptoms of hypoglycemia. They include:  Dizziness.  Shaking.  Sweating.  Chills.  Confusion.  Drink plenty of water while you exercise to prevent dehydration or heat stroke. Body water is lost during exercise and must be replaced.  Talk to your health care provider before starting an exercise  program to make sure it is safe for you. Remember, almost any type of activity is better than none. Document Released: 01/28/2004 Document Revised: 03/24/2014 Document Reviewed: 04/16/2013 [facility] Patient Information 2015 [place], [place]. This information is not intended to replace advice given to you by your health care provider. Make sure you discuss any questions you have with your health care provider.

## 2014-09-12 NOTE — Progress Notes (Signed)
Pre visit review using our clinic review tool, if applicable. No additional management support is needed unless otherwise documented below in the visit note. 

## 2014-09-15 NOTE — Assessment & Plan Note (Signed)
He will follow up with endocrinology. Reminded him of the important of not running out of insulin and spoke with him about alcohol blocking effects of hypoglycemia. Check HgA1c, foot exam, microalbumin to creatinine ratio.

## 2014-09-15 NOTE — Assessment & Plan Note (Signed)
Refill given of his albuterol inhaler and at this time no exacerbation, classify as mild intermittent.

## 2014-09-16 ENCOUNTER — Encounter: Payer: Self-pay | Admitting: Endocrinology

## 2014-09-16 ENCOUNTER — Ambulatory Visit (INDEPENDENT_AMBULATORY_CARE_PROVIDER_SITE_OTHER): Payer: 59 | Admitting: Endocrinology

## 2014-09-16 VITALS — BP 140/79 | HR 76 | Temp 98.5°F | Ht 75.0 in | Wt 287.0 lb

## 2014-09-16 DIAGNOSIS — R202 Paresthesia of skin: Secondary | ICD-10-CM

## 2014-09-16 DIAGNOSIS — E1140 Type 2 diabetes mellitus with diabetic neuropathy, unspecified: Secondary | ICD-10-CM | POA: Insufficient documentation

## 2014-09-16 DIAGNOSIS — E109 Type 1 diabetes mellitus without complications: Secondary | ICD-10-CM

## 2014-09-16 LAB — VITAMIN B12: Vitamin B-12: 385 pg/mL (ref 211–911)

## 2014-09-16 LAB — BASIC METABOLIC PANEL
BUN: 9 mg/dL (ref 6–23)
CO2: 26 meq/L (ref 19–32)
CREATININE: 1 mg/dL (ref 0.4–1.5)
Calcium: 9.2 mg/dL (ref 8.4–10.5)
Chloride: 102 mEq/L (ref 96–112)
GFR: 111.73 mL/min (ref >60.00)
Glucose, Bld: 257 mg/dL — ABNORMAL HIGH (ref 70–99)
Potassium: 3.7 mEq/L (ref 3.5–5.1)
Sodium: 134 mEq/L — ABNORMAL LOW (ref 135–145)

## 2014-09-16 LAB — LIPID PANEL
CHOL/HDL RATIO: 4
Cholesterol: 216 mg/dL — ABNORMAL HIGH (ref 0–200)
HDL: 55.5 mg/dL (ref >39.00)
LDL Cholesterol: 136 mg/dL — ABNORMAL HIGH (ref 0–99)
NonHDL: 160.5
Triglycerides: 124 mg/dL (ref 0.0–149.0)
VLDL: 24.8 mg/dL (ref 0.0–40.0)

## 2014-09-16 LAB — MICROALBUMIN / CREATININE URINE RATIO
Creatinine,U: 205.2 mg/dL
Microalb Creat Ratio: 0.4 mg/g (ref 0.0–30.0)
Microalb, Ur: 0.8 mg/dL (ref 0.0–1.9)

## 2014-09-16 LAB — HEMOGLOBIN A1C: Hgb A1c MFr Bld: 11.1 % — ABNORMAL HIGH (ref 4.6–6.5)

## 2014-09-16 MED ORDER — ATORVASTATIN CALCIUM 10 MG PO TABS: 10.0000 mg | ORAL_TABLET | Freq: Every day | ORAL | Status: DC

## 2014-09-16 NOTE — Progress Notes (Signed)
Subjective:    Patient ID: Christopher Douglas, male    DOB: December 09, 1990, 23 y.o.   MRN: [id number]  HPI Pt returns for f/u of diabetes mellitus: DM type: 1 Dx'ed: 2014 Complications: none Therapy: insulin since DKA: only at dx Severe hypoglycemia: never Pancreatitis: never Other: he takes multiple daily injections Interval history:  no cbg record, but states cbg's vary from 170-200's.  It is in general higher as the day goes on. He has slight tingling of the feet, but no assoc pain. Past Medical History  Diagnosis Date  . Asthma   . Diabetes mellitus without complication   . Hypertension     Past Surgical History  Procedure Laterality Date  . Foot surgery      History   Social History  . Marital Status: Single    Spouse Name: N/A    Number of Children: N/A  . Years of Education: N/A   Occupational History  . Not on file.   Social History Main Topics  . Smoking status: Former Smoker -- 5 years    Quit date: 12/06/2012  . Smokeless tobacco: Not on file  . Alcohol Use: 0.0 oz/week    1-2 Cans of beer per week     Comment: occassional  . Drug Use: No  . Sexual Activity: Yes    Birth Control/ Protection: Condom   Other Topics Concern  . Not on file   Social History Narrative  . No narrative on file    Current Outpatient Prescriptions on File Prior to Visit  Medication Sig Dispense Refill  . albuterol (PROVENTIL HFA;VENTOLIN HFA) 108 (90 BASE) MCG/ACT inhaler Inhale 1-2 puffs into the lungs every 6 (six) hours as needed for wheezing.  1 Inhaler  0  . glucose blood (ONETOUCH VERIO) test strip 1 each by Other route 2 (two) times daily. And lancets 2/day  100 each  12  . ibuprofen (ADVIL,MOTRIN) 600 MG tablet Take 1 tablet (600 mg total) by mouth every 8 (eight) hours as needed.  30 tablet  0  . Insulin Glargine (LANTUS SOLOSTAR) 100 UNIT/ML Solostar Pen Inject 28 Units into the skin daily at 10 pm.  5 pen  PRN  . Insulin Pen Needle 29G X 13MM MISC Use as instructed   30 each  12  . lisinopril (PRINIVIL,ZESTRIL) 10 MG tablet Take 1 tablet (10 mg total) by mouth daily.  30 tablet  6   No current facility-administered medications on file prior to visit.    Allergies  Allergen Reactions  . Amoxicillin Hives    Breaks out in hives  . Shellfish Allergy     Specifically shrimp    Family History  Problem Relation Age of Onset  . Diabetes Mellitus II Father   . Hypertension Father   . Alcohol abuse Sister   . Alcohol abuse Brother   . Diabetes Maternal Grandmother   . Diabetes Maternal Grandfather   . Hypertension Maternal Grandfather     BP 140/79  Pulse 76  Temp(Src) 98.5 F (36.9 C) (Oral)  Ht 6\' 3"  (1.905 m)  Wt 287 lb (130.182 kg)  BMI 35.87 kg/m2  SpO2 97%  Review of Systems He denies hypoglycemia and weight change.     Objective:   Physical Exam VITAL SIGNS:  See vs page GENERAL: no distress Pulses: dorsalis pedis intact bilat.   Feet: no deformity.  no edema Skin:  no ulcer on the feet.  normal color and temp. Neuro: sensation is intact to touch  on the feet   Lab Results  Component Value Date   HGBA1C 11.1* 09/16/2014   Lab Results  Component Value Date   CHOL 216* 09/16/2014   HDL 55.50 09/16/2014   LDLCALC 136* 09/16/2014   TRIG 124.0 09/16/2014   CHOLHDL 4 09/16/2014       Assessment & Plan:  DM: severe exacerbation.  I discussed pump rx with [name], RN.  Dyslipidemia: not well-controlled.  i have sent a prescription to your pharmacy, to resume the lipitor. Noncompliance with cbg recording: I'll work around this as best I can. Paresthesias, prob due to DM.   Patient is advised the following: Patient Instructions  check your blood sugar 4 times a day--before the 3 meals, and at bedtime.  also check if you have symptoms of your blood sugar being too high or too low.  please keep a record of the readings and bring it to your next appointment here.  please call us sooner if your blood sugar goes below  70, or if you have a lot of readings over 200.   Blood tests are requested for you today.  We'll contact you with results.   Based on the results, we'll probably need to increase the novolog.   Please come back for a follow-up appointment in 3 months.

## 2014-09-16 NOTE — Patient Instructions (Addendum)
check your blood sugar 4 times a day--before the 3 meals, and at bedtime.  also check if you have symptoms of your blood sugar being too high or too low.  please keep a record of the readings and bring it to your next appointment here.  please call us sooner if your blood sugar goes below 70, or if you have a lot of readings over 200.   Blood tests are requested for you today.  We'll contact you with results.   Based on the results, we'll probably need to increase the novolog.   Please come back for a follow-up appointment in 3 months.

## 2014-09-17 ENCOUNTER — Telehealth: Payer: Self-pay | Admitting: Endocrinology

## 2014-09-17 NOTE — Telephone Encounter (Signed)
Pt advised that A1C was 11.1. Pt instructed per Md's instructions to increase is insulin to 15 units 3 times a day just before each meal. Pt voiced understanding.

## 2014-09-17 NOTE — Telephone Encounter (Signed)
Patient is calling for the result of his lab work

## 2014-10-07 ENCOUNTER — Encounter: Payer: 59 | Admitting: Nutrition

## 2014-12-11 ENCOUNTER — Emergency Department (HOSPITAL_COMMUNITY): Payer: 59

## 2014-12-11 ENCOUNTER — Emergency Department (HOSPITAL_COMMUNITY)
Admission: EM | Admit: 2014-12-11 | Discharge: 2014-12-11 | Disposition: A | Discharge status: Home / Self Care | Payer: 59 | Attending: Emergency Medicine | Admitting: Emergency Medicine

## 2014-12-11 ENCOUNTER — Encounter (HOSPITAL_COMMUNITY): Payer: Self-pay | Admitting: *Deleted

## 2014-12-11 DIAGNOSIS — I10 Essential (primary) hypertension: Secondary | ICD-10-CM | POA: Diagnosis not present

## 2014-12-11 DIAGNOSIS — B079 Viral wart, unspecified: Secondary | ICD-10-CM | POA: Diagnosis not present

## 2014-12-11 DIAGNOSIS — L738 Other specified follicular disorders: Secondary | ICD-10-CM | POA: Insufficient documentation

## 2014-12-11 DIAGNOSIS — Z79899 Other long term (current) drug therapy: Secondary | ICD-10-CM | POA: Insufficient documentation

## 2014-12-11 DIAGNOSIS — Z880 Allergy status to penicillin: Secondary | ICD-10-CM | POA: Insufficient documentation

## 2014-12-11 DIAGNOSIS — J45909 Unspecified asthma, uncomplicated: Secondary | ICD-10-CM | POA: Diagnosis not present

## 2014-12-11 DIAGNOSIS — E119 Type 2 diabetes mellitus without complications: Secondary | ICD-10-CM | POA: Diagnosis not present

## 2014-12-11 DIAGNOSIS — Z720 Tobacco use: Secondary | ICD-10-CM | POA: Insufficient documentation

## 2014-12-11 DIAGNOSIS — I861 Scrotal varices: Secondary | ICD-10-CM | POA: Insufficient documentation

## 2014-12-11 DIAGNOSIS — N508 Other specified disorders of male genital organs: Secondary | ICD-10-CM | POA: Diagnosis present

## 2014-12-11 DIAGNOSIS — Z794 Long term (current) use of insulin: Secondary | ICD-10-CM | POA: Diagnosis not present

## 2014-12-11 DIAGNOSIS — L739 Follicular disorder, unspecified: Secondary | ICD-10-CM

## 2014-12-11 DIAGNOSIS — N50812 Left testicular pain: Secondary | ICD-10-CM

## 2014-12-11 MED ORDER — LIDOCAINE HCL (PF) 1 % IJ SOLN
2.0000 mL | Freq: Once | INTRAMUSCULAR | Status: AC
  Administered 2014-12-11: 2 mL
  Filled 2014-12-11: qty 5

## 2014-12-11 NOTE — ED Notes (Addendum)
Pt reports "callus" on his L middle finger that does not go away, it appears to be a wart.  Pt also c/o L testicular pain and swelling for a "few months."  Denies any penile discharge at this time.  Pt also c/o abd pain today which prompted him to come get checked out.

## 2014-12-11 NOTE — ED Notes (Signed)
Pt alert, oriented, and ambulatory upon DC. He reports that he is leaving with ALL belongings that he arrived with. He was advised to follow up with urology and dermatology in 1 week.

## 2014-12-11 NOTE — Discharge Instructions (Signed)
1. Medications: usual home medications 2. Treatment: rest, drink plenty of fluids, warm compresses for infected follicle on your scrotum 3. Follow Up: Please followup with your primary doctor, dermatology and urology in 1 week for further evaluation of your symptoms after today's visit; if you do not have a primary care doctor use the resource guide provided to find one; Please return to the ER for worsening pain,     Ingrown Hair An ingrown hair is a hair that curls and re-enters the skin instead of growing straight out of the skin. It happens most often with curly hair. It is usually more severe in the neck area, but it can occur in any shaved area, including the beard area, groin, scalp, and legs. An ingrown hair may cause small pockets of infection. CAUSES  Shaving closely, tweezing, or waxing, especially curly hair. Using hair removal creams can sometimes lead to ingrown hairs, especially in the groin. SYMPTOMS   Small bumps on the skin. The bumps may be filled with pus.  Pain.  Itching. DIAGNOSIS  Your caregiver can usually tell what is wrong by doing a physical exam. TREATMENT  If there is a severe infection, your caregiver may prescribe antibiotic medicines. Laser hair removal may also be done to help prevent regrowth of the hair. HOME CARE INSTRUCTIONS   Do not shave irritated skin. You may start shaving again once the irritation has gone away.  If you are prone to ingrown hairs, consider not shaving as much as possible.  If antibiotics are prescribed, take them as directed. Finish them even if you start to feel better.  You may use a facial sponge in a gentle circular motion to help dislodge ingrown hairs on the face.  You may use a hair removal cream weekly, especially on the legs and underarms. Stop using the cream if it irritates your skin. Use caution when using hair removal creams in the groin area. SHAVING INSTRUCTIONS AFTER TREATMENT  Shower before shaving. Keep  areas to be shaved packed in warm, moist wraps for several minutes before shaving. The warm, moist environment helps soften the hairs and makes ingrown hairs less likely to occur.  Use thick shaving gels.  Use a bump fighter razor that cuts hair slightly above the skin level or use an electric shaver with a longer shave setting.  Shave in the direction of hair growth. Avoid making multiple razor strokes.  Use moisturizing lotions after shaving. Document Released: 02/13/2001 Document Revised: 05/08/2012 Document Reviewed: 02/07/2012 [facility] Patient Information 2015 [place]. This information is not intended to replace advice given to you by your health care provider. Make sure you discuss any questions you have with your health care provider.

## 2014-12-11 NOTE — ED Provider Notes (Signed)
CSN: [id number]     Arrival date & time 12/11/14  1620 History   First MD Initiated Contact with Patient 12/11/14 1648     Chief Complaint  Patient presents with  . Verrucous Vulgaris  . Testicle Pain     (Consider location/radiation/quality/duration/timing/severity/associated sxs/prior Treatment) The history is provided by the patient and medical records. No language interpreter was used.     Christopher Douglas is a 24 y.o. male  with a hx of some dependent diabetes, asthma, hypertension presents to the Emergency Department complaining of gradual, persistent, progressively worsening pain to the left scrotum and left testicle onset several months ago.  Patient reports that after shaving his scrotum he noticed a small lump on the left side. He reports that in the last week the left testicle and spermatic cord has become tender to palpation including his left groin. He reports the spot on his left scrotal sac is worse and more tender to palpation.  He denies fevers or chills, nausea or vomiting.  Patient denies abdominal pain but reports that his "stomach hurts like I got kicked in the balls."  He reports normal by mouth intake. No aggravating or alleviating factors. No other associated symptoms.  Patient also complaining of a "callus" to his left middle finger that does not go away. Patient reports it is in there many months and he has tried no over-the-counter treatments. He reports no other spots on his hands or feet. He reports he thinks it might be a wart but isn't sure. He denies bleeding at the site, pain to the finger or restricted range of motion of the hand.   Past Medical History  Diagnosis Date  . Asthma   . Diabetes mellitus without complication   . Hypertension    Past Surgical History  Procedure Laterality Date  . Foot surgery     Family History  Problem Relation Age of Onset  . Diabetes Mellitus II Father   . Hypertension Father   . Alcohol abuse Sister   . Alcohol abuse  Brother   . Diabetes Maternal Grandmother   . Diabetes Maternal Grandfather   . Hypertension Maternal Grandfather    History  Substance Use Topics  . Smoking status: Current Every Day Smoker -- 0.25 packs/day for 5 years    Types: Cigarettes    Last Attempt to Quit: 12/06/2012  . Smokeless tobacco: Not on file  . Alcohol Use: 0.0 oz/week    1-2 Cans of beer per week     Comment: occassional    Review of Systems  Constitutional: Negative for fever, diaphoresis, appetite change, fatigue and unexpected weight change.  HENT: Negative for mouth sores.   Eyes: Negative for visual disturbance.  Respiratory: Negative for cough, chest tightness, shortness of breath and wheezing.   Cardiovascular: Negative for chest pain.  Gastrointestinal: Negative for nausea, vomiting, abdominal pain, diarrhea and constipation.  Endocrine: Negative for polydipsia, polyphagia and polyuria.  Genitourinary: Positive for testicular pain. Negative for dysuria, urgency, frequency and hematuria.  Musculoskeletal: Negative for back pain and neck stiffness.  Skin: Negative for rash.       Wart, left finger  Allergic/Immunologic: Negative for immunocompromised state.  Neurological: Negative for syncope, light-headedness and headaches.  Hematological: Does not bruise/bleed easily.  Psychiatric/Behavioral: Negative for sleep disturbance. The patient is not nervous/anxious.       Allergies  Amoxicillin and Shellfish allergy  Home Medications   Prior to Admission medications   Medication Sig Start Date End Date Taking? Authorizing  Provider  albuterol (PROVENTIL HFA;VENTOLIN HFA) 108 (90 BASE) MCG/ACT inhaler Inhale 1-2 puffs into the lungs every 6 (six) hours as needed for wheezing. 09/12/14 02/10/17 Yes Judie Bonus, MD  atorvastatin (LIPITOR) 10 MG tablet Take 1 tablet (10 mg total) by mouth daily. 09/16/14  Yes Romero Belling, MD  ibuprofen (ADVIL,MOTRIN) 600 MG tablet Take 1 tablet (600 mg total) by  mouth every 8 (eight) hours as needed. Patient taking differently: Take 600 mg by mouth every 8 (eight) hours as needed for moderate pain.  06/23/14  Yes Thao P Le, DO  insulin aspart (NOVOLOG) 100 UNIT/ML FlexPen Inject 15 Units into the skin 3 (three) times daily with meals. 09/12/14  Yes Judie Bonus, MD  Insulin Glargine (LANTUS SOLOSTAR) 100 UNIT/ML Solostar Pen Inject 28 Units into the skin daily at 10 pm. 09/12/14  Yes Judie Bonus, MD  lisinopril (PRINIVIL,ZESTRIL) 10 MG tablet Take 1 tablet (10 mg total) by mouth daily. 09/12/14  Yes Judie Bonus, MD  glucose blood (ONETOUCH VERIO) test strip 1 each by Other route 2 (two) times daily. And lancets 2/day Patient not taking: Reported on 12/11/2014 09/12/14   Judie Bonus, MD  Insulin Pen Needle 29G X MISC Use as instructed Patient not taking: Reported on 12/11/2014 09/12/14   Judie Bonus, MD   BP 135/77 mmHg  Pulse 67  Temp(Src) 97.9 F (36.6 C) (Oral)  Resp 18  SpO2 97% Physical Exam  Constitutional: He appears well-developed and well-nourished. No distress.  Awake, alert, nontoxic appearance  HENT:  Head: Normocephalic and atraumatic.  Mouth/Throat: Oropharynx is clear and moist. No oropharyngeal exudate.  Eyes: Conjunctivae are normal. No scleral icterus.  Neck: Normal range of motion. Neck supple.  Cardiovascular: Normal rate, regular rhythm and intact distal pulses.   Pulmonary/Chest: Effort normal and breath sounds normal. No respiratory distress. He has no wheezes.  Equal chest expansion  Abdominal: Soft. Bowel sounds are normal. He exhibits no mass. There is no tenderness. There is no rebound and no guarding. Hernia confirmed negative in the right inguinal area and confirmed negative in the left inguinal area.  Genitourinary: Penis normal.    Right testis shows no mass, no swelling and no tenderness. Right testis is descended. Cremasteric reflex is not absent on the right side. Left testis  shows tenderness. Left testis shows no mass and no swelling. Left testis is descended. Cremasteric reflex is not absent on the left side. Circumcised. No phimosis, paraphimosis, hypospadias, penile erythema or penile tenderness. No discharge found.  Tenderness to palpation of the left testicle, epididymis and spermatic cord without palpable induration, increased warmth or erythema of the scrotum   Musculoskeletal: Normal range of motion. He exhibits no edema.  Lymphadenopathy:       Right: No inguinal adenopathy present.       Left: No inguinal adenopathy present.  Neurological: He is alert.  Speech is clear and goal oriented Moves extremities without ataxia  Skin: Skin is warm and dry. He is not diaphoretic.  Warts noted to the palmar side of the left middle finger  Psychiatric: He has a normal mood and affect.  Nursing note and vitals reviewed.   ED Course  INCISION AND DRAINAGE Date/Time: 12/11/2014 7:32 PM Performed by: Dierdre Forth Authorized by: Dierdre Forth Consent: Verbal consent obtained. Risks and benefits: risks, benefits and alternatives were discussed Consent given by: patient Patient understanding: patient states understanding of the procedure being performed Patient consent: the patient's understanding of  the procedure matches consent given Procedure consent: procedure consent matches procedure scheduled Relevant documents: relevant documents present and verified Site marked: the operative site was marked Imaging studies: imaging studies available Required items: required blood products, implants, devices, and special equipment available Patient identity confirmed: verbally with patient and arm band Time out: Immediately prior to procedure a "time out" was called to verify the correct patient, procedure, equipment, support staff and site/side marked as required. Type: abscess Body area: anogenital Location details: scrotal wall Anesthesia: local  infiltration Local anesthetic: lidocaine 1% without epinephrine Anesthetic total: 1 ml Patient sedated: no Scalpel size: 11 Incision type: single straight Complexity: simple Drainage: purulent Drainage amount: scant Wound treatment: wound left open Packing material: none Patient tolerance: Patient tolerated the procedure well with no immediate complications   (including critical care time) Labs Review Labs Reviewed - No data to display  Imaging Review [place] Scrotum  12/11/2014   CLINICAL DATA:  24 year old male with several month history of left-sided testicular pain  EXAM: ULTRASOUND OF SCROTUM  TECHNIQUE: Complete ultrasound examination of the testicles, epididymis, and other scrotal structures was performed.  COMPARISON:  None.  FINDINGS: Right testicle  Measurements: 4.7 x 1.9 x 3.2 cm. No mass or microlithiasis visualized.  Left testicle  Measurements: 4.9 x 2.0 x 2.8 cm. No mass or microlithiasis visualized.  Right epididymis:  Normal in size and appearance.  Left epididymis:  Normal in size and appearance.  Hydrocele:  None visualized.  Varicocele: Positive for left-sided varicocele. Dilated pampiniform plexus veins measure up to 8 mm with Valsalva. On the right, there may be minimal dilatation of the veins to 3 mm which is positive for varicocele as well.  IMPRESSION: 1. Negative for evidence of testicular torsion at this time. 2. Positive for at least moderate left-sided varicocele. 3. Positive for mild right-sided varicocele.   Electronically Signed   By: Malachy MoanHeath  McCullough M.D.   On: 12/11/2014 18:56   [place] Art/ven Flow Abd Pelv Doppler  12/11/2014   CLINICAL DATA:  24 year old male with several month history of left-sided testicular pain  EXAM: ULTRASOUND OF SCROTUM  TECHNIQUE: Complete ultrasound examination of the testicles, epididymis, and other scrotal structures was performed.  COMPARISON:  None.  FINDINGS: Right testicle  Measurements: 4.7 x 1.9 x 3.2 cm. No mass or microlithiasis  visualized.  Left testicle  Measurements: 4.9 x 2.0 x 2.8 cm. No mass or microlithiasis visualized.  Right epididymis:  Normal in size and appearance.  Left epididymis:  Normal in size and appearance.  Hydrocele:  None visualized.  Varicocele: Positive for left-sided varicocele. Dilated pampiniform plexus veins measure up to 8 mm with Valsalva. On the right, there may be minimal dilatation of the veins to 3 mm which is positive for varicocele as well.  IMPRESSION: 1. Negative for evidence of testicular torsion at this time. 2. Positive for at least moderate left-sided varicocele. 3. Positive for mild right-sided varicocele.   Electronically Signed   By: Malachy MoanHeath  McCullough M.D.   On: 12/11/2014 18:56     EKG Interpretation None      MDM   Final diagnoses:  Pain in left testicle  Left varicocele  Hair follicle infection   [facility] HouseJamal Toutant  presents with left-sided scrotal and testicular pain. On exam patient with infected follicle of the scrotal wall, however he has tenderness to the left testicle, left epididymis and left spermatic cord. No lymphadenopathy in the left groin. Will obtain ultrasound and plan for I&D of infected follicle.  7:10PM No evidence of testicular torsion but moderate left-sided varicocele.  Right varicocele also noted however no tenderness to palpation of the right testicle  8:00PM I&D of infected follicle. Pus and small amount of cystic material expressed however edges have a warty look.  Recommend follow-up with urology for infected follicle/wart and bilateral varicoceles.  Patient also referred to dermatology for treatment of the wart of his left hand.  I have personally reviewed patient's vitals, nursing note and any pertinent labs or imaging.  I performed an undressed physical exam.    It has been determined that no acute conditions requiring further emergency intervention are present at this time. The patient/guardian have been advised of the diagnosis and plan. I  reviewed all labs and imaging including any potential incidental findings. We have discussed signs and symptoms that warrant return to the ED and they are listed in the discharge instructions.    Vital signs are stable at discharge.   BP 135/77 mmHg  Pulse 67  Temp(Src) 97.9 F (36.6 C) (Oral)  Resp 18  SpO2 97%            Dierdre Forth, PA-C 12/12/14 4098  Gwyneth Sprout, MD 12/12/14 1135

## 2014-12-17 ENCOUNTER — Telehealth: Payer: Self-pay | Admitting: Endocrinology

## 2014-12-17 ENCOUNTER — Ambulatory Visit: Payer: 59 | Admitting: Endocrinology

## 2014-12-17 NOTE — Telephone Encounter (Signed)
Patient no showed today's appt. Please advise on how to follow up. °A. No follow up necessary. °B. Follow up urgent. Contact patient immediately. °C. Follow up necessary. Contact patient and schedule visit in ___ days. °D. Follow up advised. Contact patient and schedule visit in ____weeks. ° °

## 2014-12-18 NOTE — Telephone Encounter (Signed)
Follow up advised. Contact patient and schedule visit in 2 weeks. 

## 2014-12-18 NOTE — Telephone Encounter (Signed)
Lvom advising pt to call back and reschedule appointment,

## 2015-02-27 ENCOUNTER — Other Ambulatory Visit: Payer: Self-pay

## 2015-02-27 VITALS — BP 138/76 | HR 72 | Ht 75.0 in | Wt 283.4 lb

## 2015-02-27 DIAGNOSIS — E109 Type 1 diabetes mellitus without complications: Secondary | ICD-10-CM

## 2015-02-27 NOTE — Patient Instructions (Signed)
1. Plan to check blood sugar 2-3 a day. Once before eating and one after eating 1 -2 hours.  Goals of 80-130 before eating and less than 180 after meals.  Check if you feel bad or funny 2. Plan to avoid drinks with sugar 3. Plan to walk 3 times a week for 30 minutes.  Eat a protein/carb snack before exercising  4. Plan to call to schedule an appointment with  Dr. Everardo AllEllison  5. [facility] for next Link to Wellness visit  May 6. Return to Link to Wellness in June

## 2015-02-27 NOTE — Patient Outreach (Signed)
[facility]) Care Management   02/27/2015  Christopher Douglas 1991-10-19 [id number]  Christopher Douglas is an 24 y.o. male.    Member seen for follow up office visit for Link to Wellness program for self management of Type 1 diabetes  Subjective: Member states that he has been working 2 jobs as he now has a job with [employer] in which he works 4 days a week.  He continues to work at [employer] on weekends and every other Friday.  States he is trying to build up some money and get things paid off.  States he has not been able to go to the gym since he is working so many hours.  States that he had to miss his appointment with Dr.Ellison because of work but he plans to call to reschedule the appointment.  States that his blood sugars have not been good but he has been trying to remember to take his insulin and to eat better.    Objective:   Review of Systems  All other systems reviewed and are negative.   Physical Exam  Filed Vitals:   02/27/15 1324  BP: 138/76  Pulse: 72   Filed Weights   02/27/15 1324  Weight: 283 lb 6.4 oz (128.549 kg)    Current Medications:   Current Outpatient Prescriptions  Medication Sig Dispense Refill  . albuterol (PROVENTIL HFA;VENTOLIN HFA) 108 (90 BASE) MCG/ACT inhaler Inhale 1-2 puffs into the lungs every 6 (six) hours as needed for wheezing. 1 Inhaler 0  . atorvastatin (LIPITOR) 10 MG tablet Take 1 tablet (10 mg total) by mouth daily. 90 tablet 3  . ibuprofen (ADVIL,MOTRIN) 600 MG tablet Take 1 tablet (600 mg total) by mouth every 8 (eight) hours as needed. (Patient taking differently: Take 600 mg by mouth every 8 (eight) hours as needed for moderate pain. ) 30 tablet 0  . insulin aspart (NOVOLOG) 100 UNIT/ML FlexPen Inject 14 Units into the skin 3 (three) times daily with meals.     . Insulin Pen Needle 29G X MISC Use as instructed 30 each 12  . lisinopril (PRINIVIL,ZESTRIL) 10 MG tablet Take 1 tablet (10 mg total) by mouth daily. 30 tablet 6   . glucose blood (ONETOUCH VERIO) test strip 1 each by Other route 2 (two) times daily. And lancets 2/day (Patient not taking: Reported on 12/11/2014) 100 each 12  . Insulin Glargine (LANTUS SOLOSTAR) 100 UNIT/ML Solostar Pen Inject 28 Units into the skin daily at 10 pm. (Patient taking differently: Inject 30 Units into the skin daily at 10 pm. ) 5 pen PRN   No current facility-administered medications for this visit.    Functional Status:   In your present state of health, do you have any difficulty performing the following activities: 02/27/2015  Hearing? N  Vision? N  Difficulty concentrating or making decisions? N  Walking or climbing stairs? N  Dressing or bathing? N  Doing errands, shopping? N    Fall/Depression Screening:    PHQ 2/9 Scores 02/27/2015  PHQ - 2 Score 0   [facility] CM Care Plan        Patient Outreach from 02/27/2015 in [facility] Link To Wellness   Care Plan Problem One  Elevated blood sugars as evidenced by hemoglobin A1c of 11.1   Care Plan for Problem One  Active   Interventions for Problem One Long Term Goal  Reinforced importance of taking his insulin as ordered and to not miss doses, Reviewed  CHO counting and reading labels, Instructed to avoid or limit drinks with sugar, Reinforced to get regular exercise   [facility] Long Term Goal (31-90 days)  Member will demonstrate decreased blood sugars as evidenced by decreased hemoglobin A1C within the next 90 days   [facility] Long Term Goal Start Date  02/27/15   [facility] Short [facility] Goal #1 (0-30 days)  Member will call to make appointment with endocrinologist by 03/05/14   [facility] Go[facility] #1 Start Date  02/27/15     Assessment:  Member is not meeting ADA goals for his blood sugars and hemoglobin A1C.  He has missed his appointment with endocrinology and needs to follow up with MD concerning his elevated blood sugars.  CBG ranges from 150-396.  He misses doses of insulin at times. He reports eating more vegetables but he has  been drinking some sugared drinks.  His exercise has decreased but he plans to start walking on trail near his new apartment.  Plan: Member to be seen for Link to Wellness pharmacy visit in May [facility]

## 2015-04-10 ENCOUNTER — Ambulatory Visit: Payer: Self-pay | Admitting: Pharmacist

## 2015-05-15 ENCOUNTER — Ambulatory Visit: Payer: Self-pay

## 2015-05-18 ENCOUNTER — Telehealth: Payer: Self-pay | Admitting: *Deleted

## 2015-05-18 NOTE — Telephone Encounter (Signed)
Left message for patient to call and schedule f/u appointment and a1c.

## 2015-05-19 ENCOUNTER — Encounter (HOSPITAL_COMMUNITY): Payer: Self-pay | Admitting: Emergency Medicine

## 2015-05-19 ENCOUNTER — Emergency Department (HOSPITAL_COMMUNITY)
Admission: EM | Admit: 2015-05-19 | Discharge: 2015-05-19 | Disposition: A | Discharge status: Home / Self Care | Payer: 59 | Attending: Emergency Medicine | Admitting: Emergency Medicine

## 2015-05-19 DIAGNOSIS — I10 Essential (primary) hypertension: Secondary | ICD-10-CM | POA: Insufficient documentation

## 2015-05-19 DIAGNOSIS — Z880 Allergy status to penicillin: Secondary | ICD-10-CM | POA: Insufficient documentation

## 2015-05-19 DIAGNOSIS — Z79899 Other long term (current) drug therapy: Secondary | ICD-10-CM | POA: Insufficient documentation

## 2015-05-19 DIAGNOSIS — J45909 Unspecified asthma, uncomplicated: Secondary | ICD-10-CM | POA: Insufficient documentation

## 2015-05-19 DIAGNOSIS — J039 Acute tonsillitis, unspecified: Secondary | ICD-10-CM | POA: Insufficient documentation

## 2015-05-19 DIAGNOSIS — E119 Type 2 diabetes mellitus without complications: Secondary | ICD-10-CM | POA: Insufficient documentation

## 2015-05-19 DIAGNOSIS — Z87891 Personal history of nicotine dependence: Secondary | ICD-10-CM | POA: Insufficient documentation

## 2015-05-19 DIAGNOSIS — Z794 Long term (current) use of insulin: Secondary | ICD-10-CM | POA: Insufficient documentation

## 2015-05-19 LAB — RAPID STREP SCREEN (MED CTR MEBANE ONLY): Streptococcus, Group A Screen (Direct): NEGATIVE

## 2015-05-19 MED ORDER — IBUPROFEN 200 MG PO TABS
600.0000 mg | ORAL_TABLET | Freq: Once | ORAL | Status: AC
  Administered 2015-05-19: 600 mg via ORAL
  Filled 2015-05-19: qty 3

## 2015-05-19 MED ORDER — AZITHROMYCIN 250 MG PO TABS
500.0000 mg | ORAL_TABLET | Freq: Once | ORAL | Status: AC
  Administered 2015-05-19: 500 mg via ORAL
  Filled 2015-05-19: qty 2

## 2015-05-19 MED ORDER — AZITHROMYCIN 250 MG PO TABS: 250.0000 mg | ORAL_TABLET | Freq: Every day | ORAL | Status: DC

## 2015-05-19 NOTE — ED Provider Notes (Signed)
CSN: [id number]     Arrival date & time 05/19/15  0209 History   First MD [id number] Contact with Patient 05/19/15 0229     Chief Complaint  Patient presents with  . Sore Throat     (Consider location/radiation/quality/duration/timing/severity/associated sxs/prior Treatment) HPI Comments: Patient states he's had a sore throat for the past 4 days.  Denies any fever or chills.  He states he's been taking over-the-counter flu medicine, DayQuil, NyQuil without relief  Patient is a 24 y.o. male presenting with pharyngitis. The history is provided by the patient.  Sore Throat This is a new problem. The current episode started in the past 7 days. The problem occurs constantly. The problem has been unchanged. Pertinent negatives include no abdominal pain, anorexia, arthralgias, chills, coughing, fever, headaches, myalgias or nausea. The symptoms are aggravated by swallowing. He has tried NSAIDs and drinking for the symptoms. The treatment provided no relief.    Past Medical History  Diagnosis Date  . Asthma   . Diabetes mellitus without complication   . Hypertension    Past Surgical History  Procedure Laterality Date  . Foot surgery     Family History  Problem Relation Age of Onset  . Diabetes Mellitus II Father   . Hypertension Father   . Alcohol abuse Sister   . Alcohol abuse Brother   . Diabetes Maternal Grandmother   . Diabetes Maternal Grandfather   . Hypertension Maternal Grandfather    History  Substance Use Topics  . Smoking status: Former Smoker -- 0.25 packs/day for 5 years    Types: Cigarettes    Quit date: 12/06/2012  . Smokeless tobacco: Not on file  . Alcohol Use: 0.0 oz/week    1-2 Cans of beer per week     Comment: occassional    Review of Systems  Constitutional: Negative for fever and chills.  Respiratory: Negative for cough.   Gastrointestinal: Negative for nausea, abdominal pain and anorexia.  Musculoskeletal: Negative for myalgias and arthralgias.   Neurological: Negative for headaches.  All other systems reviewed and are negative.     Allergies  Amoxicillin and Shellfish allergy  Home Medications   Prior to Admission medications   Medication Sig Start Date End Date Taking? Authorizing Provider  albuterol (PROVENTIL HFA;VENTOLIN HFA) 108 (90 BASE) MCG/ACT inhaler Inhale 1-2 puffs into the lungs every 6 (six) hours as needed for wheezing. 09/12/14 02/10/17  Judie BonusElizabeth A Kollar, MD  atorvastatin (LIPITOR) 10 MG tablet Take 1 tablet (10 mg total) by mouth daily. 09/16/14   Romero BellingSean Ellison, MD  glucose blood (ONETOUCH VERIO) test strip 1 each by Other route 2 (two) times daily. And lancets 2/day Patient not taking: Reported on 12/11/2014 09/12/14   Judie BonusElizabeth A Kollar, MD  ibuprofen (ADVIL,MOTRIN) 600 MG tablet Take 1 tablet (600 mg total) by mouth every 8 (eight) hours as needed. Patient taking differently: Take 600 mg by mouth every 8 (eight) hours as needed for moderate pain.  06/23/14   Thao P Le, DO  insulin aspart (NOVOLOG) 100 UNIT/ML FlexPen Inject 14 Units into the skin 3 (three) times daily with meals.  09/12/14   Judie BonusElizabeth A Kollar, MD  Insulin Glargine (LANTUS SOLOSTAR) 100 UNIT/ML Solostar Pen Inject 28 Units into the skin daily at 10 pm. Patient taking differently: Inject 30 Units into the skin daily at 10 pm.  09/12/14   Judie BonusElizabeth A Kollar, MD  Insulin Pen Needle 29G X 13MM MISC Use as instructed 09/12/14   Judie BonusElizabeth A Kollar, MD  lisinopril (PRINIVIL,ZESTRIL) 10 MG tablet Take 1 tablet (10 mg total) by mouth daily. 09/12/14   Judie Bonus, MD   BP 151/74 mmHg  Pulse 63  Temp(Src) 97.9 F (36.6 C) (Oral)  Resp 16  SpO2 99% Physical Exam  Constitutional: He appears well-developed and well-nourished.  HENT:  Head: Normocephalic.  Mouth/Throat: Uvula is midline. Oropharyngeal exudate and posterior oropharyngeal erythema present.  Eyes: Pupils are equal, round, and reactive to light.  Neck: Normal range of motion.   Cardiovascular: Normal rate.   Pulmonary/Chest: Effort normal and breath sounds normal.  Musculoskeletal: Normal range of motion.  Lymphadenopathy:    He has no cervical adenopathy.  Neurological: He is alert.  Skin: Skin is warm and dry. No rash noted.  Psychiatric: His behavior is normal.  Nursing note and vitals reviewed.   ED Course  Procedures (including critical care time) Labs Review Labs Reviewed  RAPID STREP SCREEN (NOT AT [facility])    Imaging Review No results found.   EKG Interpretation None      MDM   Final diagnoses:  None         Earley Favor, NP 05/19/15 1610  Paula Libra, MD 05/19/15 [phone]

## 2015-05-19 NOTE — ED Notes (Signed)
Pt states that he has had a sore throat for several days. Hurts to eat, swallow, etc... Alert and oriented.

## 2015-05-21 LAB — CULTURE, GROUP A STREP

## 2015-06-09 ENCOUNTER — Ambulatory Visit: Payer: Self-pay

## 2015-06-22 ENCOUNTER — Encounter (HOSPITAL_COMMUNITY): Payer: Self-pay | Admitting: *Deleted

## 2015-06-22 ENCOUNTER — Emergency Department (HOSPITAL_COMMUNITY)
Admission: EM | Admit: 2015-06-22 | Discharge: 2015-06-23 | Disposition: A | Discharge status: Home / Self Care | Payer: 59 | Attending: Emergency Medicine | Admitting: Emergency Medicine

## 2015-06-22 DIAGNOSIS — Z720 Tobacco use: Secondary | ICD-10-CM | POA: Insufficient documentation

## 2015-06-22 DIAGNOSIS — Z794 Long term (current) use of insulin: Secondary | ICD-10-CM | POA: Insufficient documentation

## 2015-06-22 DIAGNOSIS — Z79899 Other long term (current) drug therapy: Secondary | ICD-10-CM | POA: Insufficient documentation

## 2015-06-22 DIAGNOSIS — Z880 Allergy status to penicillin: Secondary | ICD-10-CM | POA: Insufficient documentation

## 2015-06-22 DIAGNOSIS — R739 Hyperglycemia, unspecified: Secondary | ICD-10-CM

## 2015-06-22 DIAGNOSIS — I10 Essential (primary) hypertension: Secondary | ICD-10-CM | POA: Insufficient documentation

## 2015-06-22 DIAGNOSIS — E1165 Type 2 diabetes mellitus with hyperglycemia: Secondary | ICD-10-CM | POA: Insufficient documentation

## 2015-06-22 DIAGNOSIS — J45909 Unspecified asthma, uncomplicated: Secondary | ICD-10-CM | POA: Insufficient documentation

## 2015-06-22 LAB — URINE MICROSCOPIC-ADD ON

## 2015-06-22 LAB — BASIC METABOLIC PANEL
Anion gap: 10 (ref 5–15)
BUN: 9 mg/dL (ref 6–20)
CO2: 27 mmol/L (ref 22–32)
Calcium: 9.2 mg/dL (ref 8.9–10.3)
Chloride: 93 mmol/L — ABNORMAL LOW (ref 101–111)
Creatinine, Ser: 1.24 mg/dL (ref 0.61–1.24)
GFR calc Af Amer: >60 mL/min (ref >60)
Glucose, Bld: 714 mg/dL (ref 65–99)
Potassium: 4.1 mmol/L (ref 3.5–5.1)
SODIUM: 130 mmol/L — AB (ref 135–145)

## 2015-06-22 LAB — URINALYSIS, ROUTINE W REFLEX MICROSCOPIC
Bilirubin Urine: NEGATIVE
Glucose, UA: >1000 mg/dL — AB
Hgb urine dipstick: NEGATIVE
Ketones, ur: NEGATIVE mg/dL
LEUKOCYTES UA: NEGATIVE
NITRITE: NEGATIVE
PH: 5.5 (ref 5.0–8.0)
PROTEIN: NEGATIVE mg/dL
SPECIFIC GRAVITY, URINE: 1.038 — AB (ref 1.005–1.030)
UROBILINOGEN UA: 0.2 mg/dL (ref 0.0–1.0)

## 2015-06-22 LAB — CBC
HEMATOCRIT: 47.7 % (ref 39.0–52.0)
Hemoglobin: 16.1 g/dL (ref 13.0–17.0)
MCH: 28.5 pg (ref 26.0–34.0)
MCHC: 33.8 g/dL (ref 30.0–36.0)
MCV: 84.6 fL (ref 78.0–100.0)
Platelets: 214 10*3/uL (ref 150–400)
RBC: 5.64 MIL/uL (ref 4.22–5.81)
RDW: 13.6 % (ref 11.5–15.5)
WBC: 8.8 10*3/uL (ref 4.0–10.5)

## 2015-06-22 LAB — CBG MONITORING, ED: Glucose-Capillary: 586 mg/dL (ref 65–99)

## 2015-06-22 MED ORDER — SODIUM CHLORIDE 0.9 % IV SOLN
INTRAVENOUS | Status: DC
  Administered 2015-06-22: 5.3 [IU]/h via INTRAVENOUS
  Filled 2015-06-22: qty 2.5

## 2015-06-22 MED ORDER — SODIUM CHLORIDE 0.9 % IV SOLN
1000.0000 mL | Freq: Once | INTRAVENOUS | Status: AC
  Administered 2015-06-22: 1000 mL via INTRAVENOUS

## 2015-06-22 MED ORDER — SODIUM CHLORIDE 0.9 % IV SOLN
1000.0000 mL | INTRAVENOUS | Status: DC
  Administered 2015-06-22: 1000 mL via INTRAVENOUS

## 2015-06-22 MED ORDER — SODIUM CHLORIDE 0.9 % IV BOLUS (SEPSIS)
1000.0000 mL | Freq: Once | INTRAVENOUS | Status: AC
  Administered 2015-06-22: 1000 mL via INTRAVENOUS

## 2015-06-22 MED ORDER — DEXTROSE-NACL 5-0.45 % IV SOLN: INTRAVENOUS | Status: DC

## 2015-06-22 NOTE — ED Notes (Signed)
Pt states that he went to check his blood sugar at home and his meter read high; pt states that he has had increase thirst and increase urination since last week; pt's blood sugar in triage is 586

## 2015-06-22 NOTE — Progress Notes (Signed)
[facility] consulted as patient is diabetic without insurance, unable to afford his insulin.  Per chart review patient takes novalog with meals and lantus at night.  EDCM emailed CM at [facility] in attemtps to schedule patient an appointment and to receive assistance with cost of medications.  Patient reports he does not have a pcp and best phone number to reach him is [phone].  No further EDCM needs at this time.

## 2015-06-22 NOTE — ED Provider Notes (Signed)
CSN: [id number]     Arrival date & time 06/22/15  2030 History   First MD Initiated Contact with Patient 06/22/15 2233     Chief Complaint  Patient presents with  . Hyperglycemia   Bharat A Wrage is a 24 y.o. male with a history of diabetes, hypertension and asthma who presents to the emergency department complaining of hyperglycemia. Patient reports he lost his insurance and has not been able to afford his insulin. He was previously on Lantus and Novolog flexpen. He reports he has gone without insulin for the past 2 weeks. He reports having urinary frequency and increased urination. He reports having some positional lightheadedness earlier today. The patient denies fevers, chills, abdominal pain, nausea, vomiting, diarrhea, chest pain, shortness of breath, dysuria, hematuria, recent illness, cough, wheezing or shortness of breath.  (Consider location/radiation/quality/duration/timing/severity/associated sxs/prior Treatment) HPI  Past Medical History  Diagnosis Date  . Asthma   . Diabetes mellitus without complication   . Hypertension    Past Surgical History  Procedure Laterality Date  . Foot surgery     Family History  Problem Relation Age of Onset  . Diabetes Mellitus II Father   . Hypertension Father   . Alcohol abuse Sister   . Alcohol abuse Brother   . Diabetes Maternal Grandmother   . Diabetes Maternal Grandfather   . Hypertension Maternal Grandfather    History  Substance Use Topics  . Smoking status: Current Every Day Smoker -- 0.15 packs/day for 5 years    Types: Cigarettes    Last Attempt to Quit: 12/06/2012  . Smokeless tobacco: Not on file  . Alcohol Use: 0.0 oz/week    1-2 Cans of beer per week     Comment: occassional    Review of Systems  Constitutional: Negative for fever and chills.  HENT: Negative for congestion and sore throat.   Eyes: Negative for visual disturbance.  Respiratory: Negative for cough, shortness of breath and wheezing.    Cardiovascular: Negative for chest pain.  Gastrointestinal: Negative for nausea, vomiting, abdominal pain and diarrhea.  Endocrine: Positive for polyuria.  Genitourinary: Positive for frequency. Negative for dysuria, urgency and difficulty urinating.  Musculoskeletal: Negative for back pain and neck pain.  Skin: Negative for rash.  Neurological: Negative for headaches.      Allergies  Amoxicillin and Shellfish allergy  Home Medications   Prior to Admission medications   Medication Sig Start Date End Date Taking? Authorizing Provider  atorvastatin (LIPITOR) 10 MG tablet Take 1 tablet (10 mg total) by mouth daily. 09/16/14  Yes Romero Belling, MD  glucose blood (ONETOUCH VERIO) test strip 1 each by Other route 2 (two) times daily. And lancets 2/day 09/12/14  Yes Judie Bonus, MD  ibuprofen (ADVIL,MOTRIN) 200 MG tablet Take 400 mg by mouth every 6 (six) hours as needed for moderate pain.   Yes Historical Provider, MD  insulin aspart (NOVOLOG) 100 UNIT/ML FlexPen Inject 10 Units into the skin 3 (three) times daily with meals.  09/12/14  Yes Judie Bonus, MD  Insulin Glargine (LANTUS SOLOSTAR) 100 UNIT/ML Solostar Pen Inject 28 Units into the skin daily at 10 pm. 09/12/14  Yes Judie Bonus, MD  Insulin Pen Needle 29G X MISC Use as instructed 09/12/14  Yes Judie Bonus, MD  albuterol (PROVENTIL HFA;VENTOLIN HFA) 108 (90 BASE) MCG/ACT inhaler Inhale 1-2 puffs into the lungs every 6 (six) hours as needed for wheezing. Patient not taking: Reported on 05/19/2015 09/12/14 02/10/17  Judie Bonus,  MD  azithromycin (ZITHROMAX) 250 MG tablet Take 1 tablet (250 mg total) by mouth daily. Patient not taking: Reported on 06/22/2015 05/19/15   Earley Favor, NP  ibuprofen (ADVIL,MOTRIN) 600 MG tablet Take 1 tablet (600 mg total) by mouth every 8 (eight) hours as needed. Patient not taking: Reported on 05/19/2015 06/23/14   Thao P Le, DO  insulin NPH-regular Human (NOVOLIN 70/30)  (70-30) 100 UNIT/ML injection Inject 15 Units into the skin 2 (two) times daily with a meal. 06/23/15   Everlene Farrier, PA-C  lisinopril (PRINIVIL,ZESTRIL) 10 MG tablet Take 1 tablet (10 mg total) by mouth daily. Patient not taking: Reported on 06/22/2015 09/12/14   Judie Bonus, MD   BP 118/71 mmHg  Pulse 62  Temp(Src) 98.2 F (36.8 C) (Oral)  Resp 14  Ht 6\' 3"  (1.905 m)  Wt 257 lb (116.574 kg)  BMI 32.12 kg/m2  SpO2 99% Physical Exam  Constitutional: He is oriented to person, place, and time. He appears well-developed and well-nourished. No distress.  Nontoxic appearing.  HENT:  Head: Normocephalic and atraumatic.  Mouth/Throat: Oropharynx is clear and moist.  Eyes: Conjunctivae are normal. Pupils are equal, round, and reactive to light. Right eye exhibits no discharge. Left eye exhibits no discharge.  Neck: Neck supple.  Cardiovascular: Normal rate, regular rhythm, normal heart sounds and intact distal pulses.  Exam reveals no gallop and no friction rub.   No murmur heard. Pulmonary/Chest: Effort normal and breath sounds normal. No respiratory distress. He has no wheezes. He has no rales.  Lungs clear to auscultation bilaterally.  Abdominal: Soft. There is no tenderness. There is no guarding.  Abdomen is soft and nontender to palpation.  Musculoskeletal: He exhibits no edema.  Patient is spontaneously moving all extremities in a coordinated fashion exhibiting good strength.   Lymphadenopathy:    He has no cervical adenopathy.  Neurological: He is alert and oriented to person, place, and time. Coordination normal.  Patient is alert and oriented x 3.   Skin: Skin is warm and dry. No rash noted. He is not diaphoretic. No erythema. No pallor.  Psychiatric: He has a normal mood and affect. His behavior is normal.  Nursing note and vitals reviewed.   ED Course  Procedures (including critical care time) Labs Review Labs Reviewed  BASIC METABOLIC PANEL - Abnormal; Notable for  the following:    Sodium 130 (*)    Chloride 93 (*)    Glucose, Bld 714 (*)    All other components within normal limits  URINALYSIS, ROUTINE W REFLEX MICROSCOPIC (NOT AT [facility]) - Abnormal; Notable for the following:    Specific Gravity, Urine 1.038 (*)    Glucose, UA >1000 (*)    All other components within normal limits  CBG MONITORING, ED - Abnormal; Notable for the following:    Glucose-Capillary 586 (*)    All other components within normal limits  CBG MONITORING, ED - Abnormal; Notable for the following:    Glucose-Capillary 323 (*)    All other components within normal limits  CBC  URINE MICROSCOPIC-ADD ON  CBG MONITORING, ED    Imaging Review No results found.   EKG Interpretation None      Filed Vitals:   06/22/15 2145 06/23/15 0001 06/23/15 0125  BP: 127/74 129/67 118/71  Pulse: 93 59 62  Temp: 98.2 F (36.8 C)    TempSrc: Oral    Resp: 18 13 14   Height: 6\' 3"  (1.905 m)    Weight: 257 lb (  116.574 kg)    SpO2: 99% 100% 99%     MDM   Meds given in ED:  Medications  0.9 %  sodium chloride infusion (0 mLs Intravenous Stopped 06/22/15 2312)    Followed by  0.9 %  sodium chloride infusion (0 mLs Intravenous Stopped 06/23/15 0104)  sodium chloride 0.9 % bolus 1,000 mL (0 mLs Intravenous Stopped 06/22/15 2311)  insulin aspart (novoLOG) injection 10 Units (10 Units Subcutaneous Given 06/23/15 0119)    Discharge Medication List as of 06/23/2015  1:24 AM      Final diagnoses:  Hyperglycemia    This is a 24 y.o. male with a history of diabetes, hypertension and asthma who presents to the emergency department complaining of hyperglycemia. Patient reports he lost his insurance and has not been able to afford his insulin. He was previously on Lantus and Novolog flexpen. He reports he has gone without insulin for the past 2 weeks. He reports having urinary frequency. On exam patient is afebrile and nontoxic appearing. His inital blood glucose is 714 with an anion gap of 10.  I started the patient on the glucose stabilizer protocol and fluid bolus. His repeat sugar several hours later was 323. Will give 10 units insulin in the ED prior to discharge and discharge him with a prescription for insulin 70/30 15 units BID with meals. I discussed this patient with case management Amy who [facility] so he can have follow up this week. Amy also reports that 70/30 insulin is less costly for the patient.  I informed him that the insulin 70/30 is only a temporary measure and he needs to follow-up with primary care as soon as possible for continued management of his diabetes. I advised the patient to follow-up with their primary care provider this week. I advised the patient to return to the emergency department with new or worsening symptoms or new concerns. The patient verbalized understanding and agreement with plan.    This patient was discussed with Dr. Blinda Leatherwood who agrees with assessment and plan.   CRITICAL CARE Performed by: Lawana Chambers   Total critical care time: 40  Critical care time was exclusive of separately billable procedures and treating other patients.  Critical care was necessary to treat or prevent imminent or life-threatening deterioration.  Critical care was time spent personally by me on the following activities: development of treatment plan with patient and/or surrogate as well as nursing, discussions with consultants, evaluation of patient's response to treatment, examination of patient, obtaining history from patient or surrogate, ordering and performing treatments and interventions, ordering and review of laboratory studies, ordering and review of radiographic studies, pulse oximetry and re-evaluation of patient's condition.    Everlene Farrier, PA-C 06/23/15 0154  Gilda Crease, MD 06/23/15 [phone]

## 2015-06-23 ENCOUNTER — Telehealth: Payer: Self-pay

## 2015-06-23 LAB — CBG MONITORING, ED: Glucose-Capillary: 323 mg/dL — ABNORMAL HIGH (ref 65–99)

## 2015-06-23 MED ORDER — INSULIN NPH ISOPHANE & REGULAR (70-30) 100 UNIT/ML ~~LOC~~ SUSP: 15.0000 [IU] | Freq: Two times a day (BID) | SUBCUTANEOUS | Status: DC

## 2015-06-23 MED ORDER — INSULIN ASPART 100 UNIT/ML ~~LOC~~ SOLN
10.0000 [IU] | Freq: Once | SUBCUTANEOUS | Status: AC
  Administered 2015-06-23: 10 [IU] via SUBCUTANEOUS
  Filled 2015-06-23: qty 1

## 2015-06-23 NOTE — Discharge Instructions (Signed)
Please follow up with primary care this week for further management of your diabetes.  Hyperglycemia Hyperglycemia occurs when the glucose (sugar) in your blood is too high. Hyperglycemia can happen for many reasons, but it most often happens to people who do not know they have diabetes or are not managing their diabetes properly.  CAUSES  Whether you have diabetes or not, there are other causes of hyperglycemia. Hyperglycemia can occur when you have diabetes, but it can also occur in other situations that you might not be as aware of, such as: Diabetes  If you have diabetes and are having problems controlling your blood glucose, hyperglycemia could occur because of some of the following reasons:  Not following your meal plan.  Not taking your diabetes medications or not taking it properly.  Exercising less or doing less activity than you normally do.  Being sick. Pre-diabetes  This cannot be ignored. Before people develop Type 2 diabetes, they almost always have "pre-diabetes." This is when your blood glucose levels are higher than normal, but not yet high enough to be diagnosed as diabetes. Research has shown that some long-term damage to the body, especially the heart and circulatory system, may already be occurring during pre-diabetes. If you take action to manage your blood glucose when you have pre-diabetes, you may delay or prevent Type 2 diabetes from developing. Stress  If you have diabetes, you may be "diet" controlled or on oral medications or insulin to control your diabetes. However, you may find that your blood glucose is higher than usual in the hospital whether you have diabetes or not. This is often referred to as "stress hyperglycemia." Stress can elevate your blood glucose. This happens because of hormones put out by the body during times of stress. If stress has been the cause of your high blood glucose, it can be followed regularly by your caregiver. That way he/she can  make sure your hyperglycemia does not continue to get worse or progress to diabetes. Steroids  Steroids are medications that act on the infection fighting system (immune system) to block inflammation or infection. One side effect can be a rise in blood glucose. Most people can produce enough extra insulin to allow for this rise, but for those who cannot, steroids make blood glucose levels go even higher. It is not unusual for steroid treatments to "uncover" diabetes that is developing. It is not always possible to determine if the hyperglycemia will go away after the steroids are stopped. A special blood test called an A1c is sometimes done to determine if your blood glucose was elevated before the steroids were started. SYMPTOMS  Thirsty.  Frequent urination.  Dry mouth.  Blurred vision.  Tired or fatigue.  Weakness.  Sleepy.  Tingling in feet or leg. DIAGNOSIS  Diagnosis is made by monitoring blood glucose in one or all of the following ways:  A1c test. This is a chemical found in your blood.  Fingerstick blood glucose monitoring.  Laboratory results. TREATMENT  First, knowing the cause of the hyperglycemia is important before the hyperglycemia can be treated. Treatment may include, but is not be limited to:  Education.  Change or adjustment in medications.  Change or adjustment in meal plan.  Treatment for an illness, infection, etc.  More frequent blood glucose monitoring.  Change in exercise plan.  Decreasing or stopping steroids.  Lifestyle changes. HOME CARE INSTRUCTIONS   Test your blood glucose as directed.  Exercise regularly. Your caregiver will give you instructions about exercise.  Pre-diabetes or diabetes which comes on with stress is helped by exercising.  Eat wholesome, balanced meals. Eat often and at regular, fixed times. Your caregiver or nutritionist will give you a meal plan to guide your sugar intake.  Being at an ideal weight is important.  If needed, losing as little as 10 to 15 pounds may help improve blood glucose levels. SEEK MEDICAL CARE IF:   You have questions about medicine, activity, or diet.  You continue to have symptoms (problems such as increased thirst, urination, or weight gain). SEEK IMMEDIATE MEDICAL CARE IF:   You are vomiting or have diarrhea.  Your breath smells fruity.  You are breathing faster or slower.  You are very sleepy or incoherent.  You have numbness, tingling, or pain in your feet or hands.  You have chest pain.  Your symptoms get worse even though you have been following your caregiver's orders.  If you have any other questions or concerns. Document Released: 05/03/2001 Document Revised: 01/30/2012 Document Reviewed: 03/05/2012 [facility] Patient Information 2015 [place], [place]. This information is not intended to replace advice given to you by your health care provider. Make sure you discuss any questions you have with your health care provider. Insulin Aspart; Insulin Aspart Protamine injection What is this medicine? INSULIN ASPART; INSULIN ASPART PROTAMINE (IN su lin AS part; IN su lin AS part PRO ta meen) is a human-made form of insulin. This drug lowers the amount of sugar in your blood. This medicine is a mixture of a rapid-acting insulin and a longer-acting insulin. It starts working 10 to 20 minutes after injection and continues to work for as long as 12 to 24 hours. This medicine may be used for other purposes; ask your health care provider or pharmacist if you have questions. COMMON BRAND NAME(S): NovoLog Mix 70/30 What should I tell my health care provider before I take this medicine? They need to know if you have any of these conditions: -episodes of hypoglycemia -kidney disease -liver disease -an unusual or allergic reaction to insulin, metacresol, other medicines, foods, dyes, or preservatives -pregnant or trying to get pregnant -breast-feeding How should I use this  medicine? This medicine is for injection under the skin. Use exactly as directed. It is important to follow the directions given to you by your health care professional or doctor. You should inject this medicine within 15 minutes of starting your meal. You will be taught how to use this medicine and how to adjust doses for activities and illness. Do not use more insulin than prescribed. Do not use more or less often than prescribed. Always check the appearance of your insulin before using it. This medicine should be white and cloudy. Do not use if it is not uniformly cloudy after mixing. To mix this medicine, roll the vial gently 10 times in your hands. If using a cartridge that is to be inserted into an insulin delivery device (for example a pen), before inserting into the delivery device, roll the cartridge gently 10 times in your hands. After insertion into the delivery device, turn the device upside down so that the glass ball moves one end of the cartridge to the other. Do this at least 10 times. Make sure to perform the mixing procedures before each injection. Do not mix this medicine with any other insulin or diluent. It is important that you put your used needles and syringes in a special sharps container. Do not put them in a trash can. If you do not  have a sharps container, call your pharmacist or healthcare provider to get one. Talk to your pediatrician regarding the use of this medicine in children. Special care may be needed. Overdosage: If you think you have taken too much of this medicine contact a poison control center or emergency room at once. NOTE: This medicine is only for you. Do not share this medicine with others. What if I miss a dose? It is important not to miss a dose. Your health care professional or doctor should discuss a plan for missed doses with you. If you do miss a dose, follow their plan. Do not take double doses. What may interact with this medicine? -other medicines  for diabetes Many medications may cause an increase or decrease in blood sugar, these include: -alcohol containing beverages -aspirin and aspirin-like drugs -chloramphenicol -chromium -diuretics -male hormones, like estrogens or progestins and birth control pills -heart medicines -isoniazid -male hormones or anabolic steroids -medicines for weight loss -medicines for allergies, asthma, cold, or cough -medicines for mental problems -medicines called MAO Inhibitors like Nardil, Parnate, Marplan, Eldepryl -niacin -NSAIDs, medicines for pain and inflammation, like ibuprofen or naproxen -pentamidine -phenytoin -probenecid -quinolone antibiotics like ciprofloxacin, levofloxacin, ofloxacin -some herbal dietary supplements -steroid medicines like prednisone or cortisone -thyroid medicine Some medications can hide the warning symptoms of low blood sugar. You may need to monitor your blood sugar more closely if you are taking one of these medications. These include: -beta-blockers such as atenolol, metoprolol, propranolol -clonidine -guanethidine -reserpine This list may not describe all possible interactions. Give your health care provider a list of all the medicines, herbs, non-prescription drugs, or dietary supplements you use. Also tell them if you smoke, drink alcohol, or use illegal drugs. Some items may interact with your medicine. What should I watch for while using this medicine? Visit your health care professional or doctor for regular checks on your progress. A test called the HbA1C (A1C) will be monitored. This is a simple blood test. It measures your blood sugar control over the last 2 to 3 months. You will receive this test every 3 to 6 months. Learn how to check your blood sugar. Learn the symptoms of low and high blood sugar and how to manage them. Always carry a quick-source of sugar with you in case you have symptoms of low blood sugar. Examples include hard sugar candy or  glucose tablets. Make sure others know that you can choke if you eat or drink when you develop serious symptoms of low blood sugar, such as seizures or unconsciousness. They must get medical help at once. Tell your doctor or health care professional if you have high blood sugar. You might need to change the dose of your medicine. If you are sick or exercising more than usual, you might need to change the dose of your medicine. Do not skip meals. Ask your doctor or health care professional if you should avoid alcohol. Many nonprescription cough and cold products contain sugar or alcohol. These can affect blood sugar. Make sure that you have the right kind of syringe for the type of insulin you use. Try not to change the brand and type of insulin or syringe unless your health care professional or doctor tells you to. Switching insulin brand or type can cause dangerously high or low blood sugar. Always keep an extra supply of insulin, syringes, and needles on hand. Use a syringe one time only. Throw away syringe and needle in a closed container to prevent accidental needle  sticks. Insulin pens and cartridges should never be shared. Even if the needle is changed, sharing may result in passing of viruses like hepatitis or HIV. Wear a medical ID bracelet or chain, and carry a card that describes your disease and details of your medicine and dosage times. What side effects may I notice from receiving this medicine? Side effects that you should report to your health care professional or doctor as soon as possible: -allergic reactions like skin rash, itching or hives, swelling of the face, lips, or tongue -breathing problems -signs and symptoms of high blood sugar such as dizziness, dry mouth, dry skin, fruity breath, nausea, stomach pain, increased hunger or thirst, increased urination -signs and symptoms of low blood sugar such as feeling anxious, confusion, dizziness, increased hunger, unusually weak or tired,  sweating, shakiness, cold, irritable, headache, blurred vision, fast heartbeat, loss of consciousness Side effects that usually do not require medical attention (report to your health care professional or doctor if they continue or are bothersome): -increase or decrease in fatty tissue under the skin due to overuse of a particular injection site -itching, burning, swelling, or rash at site where injected This list may not describe all possible side effects. Call your doctor for medical advice about side effects. You may report side effects to FDA at 1-800-FDA-1088. Where should I keep my medicine? Keep out of the reach of children. Store unopened insulin vials in a refrigerator between 2 and 8 degrees C (36 and 46 degrees F). Do not freeze or use if the insulin has been frozen. Opened vials (vials currently in use) may be stored in the refrigerator or at room temperature, at approximately 30 degrees C (86 degrees F) or cooler. Keeping your insulin at room temperature decreases the amount of pain during injection. Once opened, your insulin can be used for 28 days. After 28 days, the vial of insulin should be thrown away. Store unopened cartridges or FlexPen in a refrigerator between 2 and 8 degrees C (36 and 46 degrees F.) Do not freeze or use if the insulin has been frozen. Once opened, the FlexPen or cartridges that are inserted into pens should be kept at room temperature, at approximately 30 degrees C (86 degrees F) or cooler. Do not store in the refrigerator. Once opened, the insulin can be used for 14 days. After 14 days, the cartridge or FlexPen should be thrown away. Protect from light and excessive heat. Throw away any unused medicine after the expiration date or after the specified time for room temperature storage has passed. NOTE: This sheet is a summary. It may not cover all possible information. If you have questions about this medicine, talk to your doctor, pharmacist, or health care  provider.  2015, [employer]. (2014-01-16 10:12:24)

## 2015-06-23 NOTE — Telephone Encounter (Signed)
Referral received from Radford Pax, CM requesting a follow up appointment be scheduled for the patient. She noted that he does not have a PCP. Called the patient and scheduled an appointment for 06/30/15 @ 1130 w/ Dr Venetia Night.  He said that he would be there.  He also informed this CM that he has not filled his new insulin rx but he will get it filled at his local pharmacy.He said that he has his other medications.  He said that he is not new to insulin and has administered it for years.  He noted that he has a glucometer at home and checks his blood sugar 2 x/day. This morning his blood sugar was 320 and he will check it again tonight. He also reported that he has no insurance.  CM explained to him the services offered at the [facility] and he is interested in speaking w/ the financial counselor as well as the pharmacy. He noted that he is currently unemployed.    An update was provided to Radford Pax, CM.

## 2015-06-29 ENCOUNTER — Encounter: Payer: Self-pay | Admitting: Family Medicine

## 2015-06-29 ENCOUNTER — Ambulatory Visit: Payer: 59 | Attending: Family Medicine | Admitting: Family Medicine

## 2015-06-29 ENCOUNTER — Other Ambulatory Visit: Payer: Self-pay | Admitting: Pharmacist

## 2015-06-29 VITALS — BP 142/79 | HR 120 | Temp 98.6°F | Ht 75.0 in | Wt 269.0 lb

## 2015-06-29 DIAGNOSIS — J4520 Mild intermittent asthma, uncomplicated: Secondary | ICD-10-CM | POA: Diagnosis not present

## 2015-06-29 DIAGNOSIS — E1065 Type 1 diabetes mellitus with hyperglycemia: Secondary | ICD-10-CM | POA: Diagnosis not present

## 2015-06-29 DIAGNOSIS — I10 Essential (primary) hypertension: Secondary | ICD-10-CM | POA: Insufficient documentation

## 2015-06-29 DIAGNOSIS — Z79899 Other long term (current) drug therapy: Secondary | ICD-10-CM | POA: Insufficient documentation

## 2015-06-29 DIAGNOSIS — E1159 Type 2 diabetes mellitus with other circulatory complications: Secondary | ICD-10-CM | POA: Insufficient documentation

## 2015-06-29 DIAGNOSIS — F17210 Nicotine dependence, cigarettes, uncomplicated: Secondary | ICD-10-CM | POA: Insufficient documentation

## 2015-06-29 DIAGNOSIS — J45909 Unspecified asthma, uncomplicated: Secondary | ICD-10-CM | POA: Insufficient documentation

## 2015-06-29 DIAGNOSIS — Z794 Long term (current) use of insulin: Secondary | ICD-10-CM | POA: Insufficient documentation

## 2015-06-29 LAB — POCT URINALYSIS DIPSTICK
BILIRUBIN UA: NEGATIVE
Glucose, UA: 500
Ketones, UA: NEGATIVE
Leukocytes, UA: NEGATIVE
NITRITE UA: NEGATIVE
Protein, UA: NEGATIVE
Spec Grav, UA: <=1.005
Urobilinogen, UA: 0.2
pH, UA: 5.5

## 2015-06-29 LAB — GLUCOSE, POCT (MANUAL RESULT ENTRY)
POC GLUCOSE: 524 mg/dL — AB (ref 70–99)
POC GLUCOSE: 429 mg/dL — AB (ref 70–99)
POC Glucose: 416 mg/dl — AB (ref 70–99)

## 2015-06-29 LAB — POCT GLYCOSYLATED HEMOGLOBIN (HGB A1C): HEMOGLOBIN A1C: 13.4

## 2015-06-29 MED ORDER — INSULIN PEN NEEDLE 32G X 4 MM MISC: 1.0000 | Freq: Three times a day (TID) | Status: DC

## 2015-06-29 MED ORDER — LISINOPRIL 10 MG PO TABS: 10.0000 mg | ORAL_TABLET | Freq: Every day | ORAL | Status: DC

## 2015-06-29 MED ORDER — ALBUTEROL SULFATE HFA 108 (90 BASE) MCG/ACT IN AERS: 1.0000 | INHALATION_SPRAY | Freq: Four times a day (QID) | RESPIRATORY_TRACT | Status: DC | PRN

## 2015-06-29 MED ORDER — INSULIN GLARGINE 100 UNIT/ML SOLOSTAR PEN: 40.0000 [IU] | PEN_INJECTOR | Freq: Every day | SUBCUTANEOUS | Status: DC

## 2015-06-29 MED ORDER — INSULIN ASPART 100 UNIT/ML ~~LOC~~ SOLN
10.0000 [IU] | Freq: Once | SUBCUTANEOUS | Status: AC
  Administered 2015-06-29: 10 [IU] via SUBCUTANEOUS

## 2015-06-29 MED ORDER — INSULIN PEN NEEDLE 31G X 8 MM MISC: 1.0000 | Freq: Three times a day (TID) | Status: DC

## 2015-06-29 MED ORDER — ATORVASTATIN CALCIUM 10 MG PO TABS: 10.0000 mg | ORAL_TABLET | Freq: Every day | ORAL | Status: DC

## 2015-06-29 MED ORDER — INSULIN ASPART 100 UNIT/ML FLEXPEN: 0.0000 [IU] | PEN_INJECTOR | Freq: Three times a day (TID) | SUBCUTANEOUS | Status: DC

## 2015-06-29 NOTE — Patient Instructions (Signed)
Diabetes Mellitus and Food It is important for you to manage your blood sugar (glucose) level. Your blood glucose level can be greatly affected by what you eat. Eating healthier foods in the appropriate amounts throughout the day at about the same time each day will help you control your blood glucose level. It can also help slow or prevent worsening of your diabetes mellitus. Healthy eating may even help you improve the level of your blood pressure and reach or maintain a healthy weight.  HOW CAN FOOD AFFECT ME? Carbohydrates Carbohydrates affect your blood glucose level more than any other type of food. Your dietitian will help you determine how many carbohydrates to eat at each meal and teach you how to count carbohydrates. Counting carbohydrates is important to keep your blood glucose at a healthy level, especially if you are using insulin or taking certain medicines for diabetes mellitus. Alcohol Alcohol can cause sudden decreases in blood glucose (hypoglycemia), especially if you use insulin or take certain medicines for diabetes mellitus. Hypoglycemia can be a life-threatening condition. Symptoms of hypoglycemia (sleepiness, dizziness, and disorientation) are similar to symptoms of having too much alcohol.  If your health care provider has given you approval to drink alcohol, do so in moderation and use the following guidelines:  Women should not have more than one drink per day, and men should not have more than two drinks per day. One drink is equal to:  12 oz of beer.  5 oz of wine.  1 oz of hard liquor.  Do not drink on an empty stomach.  Keep yourself hydrated. Have water, diet soda, or unsweetened iced tea.  Regular soda, juice, and other mixers might contain a lot of carbohydrates and should be counted. WHAT FOODS ARE NOT RECOMMENDED? As you make food choices, it is important to remember that all foods are not the same. Some foods have fewer nutrients per serving than other  foods, even though they might have the same number of calories or carbohydrates. It is difficult to get your body what it needs when you eat foods with fewer nutrients. Examples of foods that you should avoid that are high in calories and carbohydrates but low in nutrients include:  Trans fats (most processed foods list trans fats on the Nutrition Facts label).  Regular soda.  Juice.  Candy.  Sweets, such as cake, pie, doughnuts, and cookies.  Fried foods. WHAT FOODS CAN I EAT? Have nutrient-rich foods, which will nourish your body and keep you healthy. The food you should eat also will depend on several factors, including:  The calories you need.  The medicines you take.  Your weight.  Your blood glucose level.  Your blood pressure level.  Your cholesterol level. You also should eat a variety of foods, including:  Protein, such as meat, poultry, fish, tofu, nuts, and seeds (lean animal proteins are best).  Fruits.  Vegetables.  Dairy products, such as milk, cheese, and yogurt (low fat is best).  Breads, grains, pasta, cereal, rice, and beans.  Fats such as olive oil, trans fat-free margarine, canola oil, avocado, and olives. DOES EVERYONE WITH DIABETES MELLITUS HAVE THE SAME MEAL PLAN? Because every person with diabetes mellitus is different, there is not one meal plan that works for everyone. It is very important that you meet with a dietitian who will help you create a meal plan that is just right for you. Document Released: 08/04/2005 Document Revised: 11/12/2013 Document Reviewed: 10/04/2013 ExitCare Patient Information 2015 [employer]. This   information is not intended to replace advice given to you by your health care provider. Make sure you discuss any questions you have with your health care provider.  

## 2015-06-29 NOTE — Progress Notes (Signed)
Christopher Douglas, is a 24 y.o. male  [id number]  [id number]  DOB - November 03, 1991  CC:  Chief Complaint  Patient presents with  . Follow-up  . Diabetes       HPI: Christopher Douglas is a 24 y.o. male with a past medical history of HTN, asthma, type 1 diabetes mellitus who was previously followed by [facility] but lost his insurance and has been unable to afford his medications here today to establish medical care. He had an ED visit 7 days ago in which he had presented with lightheadedness was found to be hyperglycemic with a blood sugar of 714 with an anion gap of 10 and so was placed on IV boluses and given 10 units of NovoLog with resulting improvement in his blood sugar several hours later to 323. He was given a prescription for Novolin 70/30 and set up for follow-up with the clinic.  His blood sugar is 524 today and he endorses compliance with Novolin 70/30 and takes 15 units twice daily. Patient has No headache, No chest pain, No abdominal pain - No Nausea, he occasionally has numbness in his hands and his feet. No Cough - SOB.  Allergies  Allergen Reactions  . Amoxicillin Hives    Breaks out in hives  . Shellfish Allergy Hives    Specifically shrimp unknown   Past Medical History  Diagnosis Date  . Asthma   . Diabetes mellitus without complication   . Hypertension    Current Outpatient Prescriptions on File Prior to Visit  Medication Sig Dispense Refill  . insulin aspart (NOVOLOG) 100 UNIT/ML FlexPen Inject 15 Units into the skin 2 (two) times daily with a meal.     . albuterol (PROVENTIL HFA;VENTOLIN HFA) 108 (90 BASE) MCG/ACT inhaler Inhale 1-2 puffs into the lungs every 6 (six) hours as needed for wheezing. (Patient not taking: Reported on 05/19/2015) 1 Inhaler 0  . atorvastatin (LIPITOR) 10 MG tablet Take 1 tablet (10 mg total) by mouth daily. (Patient not taking: Reported on 06/29/2015) 90 tablet 3  . azithromycin (ZITHROMAX) 250 MG tablet Take 1  tablet (250 mg total) by mouth daily. (Patient not taking: Reported on 06/22/2015) 4 tablet 0  . glucose blood (ONETOUCH VERIO) test strip 1 each by Other route 2 (two) times daily. And lancets 2/day (Patient not taking: Reported on 06/29/2015) 100 each 12  . ibuprofen (ADVIL,MOTRIN) 200 MG tablet Take 400 mg by mouth every 6 (six) hours as needed for moderate pain.    Marland Kitchen ibuprofen (ADVIL,MOTRIN) 600 MG tablet Take 1 tablet (600 mg total) by mouth every 8 (eight) hours as needed. (Patient not taking: Reported on 05/19/2015) 30 tablet 0  . Insulin Glargine (LANTUS SOLOSTAR) 100 UNIT/ML Solostar Pen Inject 28 Units into the skin daily at 10 pm. (Patient not taking: Reported on 06/29/2015) 5 pen PRN  . insulin NPH-regular Human (NOVOLIN 70/30) (70-30) 100 UNIT/ML injection Inject 15 Units into the skin 2 (two) times daily with a meal. (Patient not taking: Reported on 06/29/2015) 10 mL 3  . Insulin Pen Needle 29G X MISC Use as instructed (Patient not taking: Reported on 06/29/2015) 30 each 12  . lisinopril (PRINIVIL,ZESTRIL) 10 MG tablet Take 1 tablet (10 mg total) by mouth daily. (Patient not taking: Reported on 06/22/2015) 30 tablet 6   No current facility-administered medications on file prior to visit.   Family History  Problem Relation Age of Onset  . Diabetes Mellitus II Father   .  Hypertension Father   . Alcohol abuse Sister   . Alcohol abuse Brother   . Diabetes Maternal Grandmother   . Diabetes Maternal Grandfather   . Hypertension Maternal Grandfather   . Hypertension Mother    History   Social History  . Marital Status: Single    Spouse Name: N/A  . Number of Children: N/A  . Years of Education: N/A   Occupational History  . Not on file.   Social History Main Topics  . Smoking status: Current Every Day Smoker -- 0.25 packs/day for 5 years    Types: Cigarettes    Last Attempt to Quit: 12/06/2012  . Smokeless tobacco: Not on file  . Alcohol Use: No  . Drug Use: No  . Sexual  Activity: Yes    Birth Control/ Protection: Condom   Other Topics Concern  . Not on file   Social History Narrative    Review of Systems: Constitutional: Negative for fever, chills, diaphoresis, activity change, appetite change and fatigue. HENT: Negative for ear pain, nosebleeds, congestion, facial swelling, rhinorrhea, neck pain, neck stiffness and ear discharge.  Eyes: Negative for pain, discharge, redness, itching and visual disturbance. Respiratory: Negative for cough, choking, chest tightness, shortness of breath, wheezing and stridor.  Cardiovascular: Negative for chest pain, palpitations and leg swelling. Gastrointestinal: Negative for abdominal distention. Genitourinary: Negative for dysuria, urgency, frequency, hematuria, flank pain, decreased urine volume, difficulty urinating and dyspareunia.  Musculoskeletal: Negative for back pain, joint swelling, arthralgia and gait problem. Neurological: Negative for dizziness, tremors, seizures, syncope, facial asymmetry, speech difficulty, weakness, light-headedness, numbness and headaches.  Hematological: Negative for adenopathy. Does not bruise/bleed easily. Skin: Negative for rash, ulcer. Psychiatric/Behavioral: Negative for hallucinations, behavioral problems, confusion, dysphoric mood, decreased concentration and agitation.    Objective:  Filed Vitals:   06/29/15 1146  BP: 142/79  Pulse: 120  Temp: 98.6 F (37 C)  Height: 6\' 3"  (1.905 m)  Weight: 269 lb (122.018 kg)  SpO2: 99%      Filed Vitals:   06/29/15 1146  BP: 142/79  Pulse: 120  Temp: 98.6 F (37 C)    Physical Exam: Constitutional: Patient appears well-developed and well-nourished. No distress. HENT: Normocephalic, atraumatic, External right and left ear normal. Oropharynx is clear and moist.  Eyes: Conjunctivae and EOM are normal. PERRLA, no scleral icterus. Neck: Normal ROM, No JVD. No tracheal deviation. No thyromegaly. CVS: Tachycardic, regular  rhythm S1/S2 +, no murmurs, no gallops, no carotid bruit.  Pulmonary: Effort and breath sounds normal, no stridor, rhonchi, wheezes, rales.  Abdominal: Soft. BS +, no distension, tenderness, rebound or guarding.  Musculoskeletal: Normal range of motion. No edema and no tenderness.  Lymphadenopathy: No lymphadenopathy noted, cervical, inguinal or axillary Neuro: Alert. Normal reflexes, muscle tone coordination. No cranial nerve deficit. Skin: Skin is warm and dry. No rash noted. Not diaphoretic. No erythema. No pallor. Psychiatric: Normal mood and affect. Behavior, judgment, thought content normal.  Lab Results  Component Value Date   WBC 8.8 06/22/2015   HGB 16.1 06/22/2015   HCT 47.7 06/22/2015   MCV 84.6 06/22/2015   PLT 214 06/22/2015   Lab Results  Component Value Date   CREATININE 1.24 06/22/2015   BUN 9 06/22/2015   NA 130* 06/22/2015   K 4.1 06/22/2015   CL 93* 06/22/2015   CO2 27 06/22/2015    Lab Results  Component Value Date   HGBA1C 13.40 06/29/2015   Lipid Panel     Component Value Date/Time   CHOL  216* 09/16/2014 1546   TRIG 124.0 09/16/2014 1546   HDL 55.50 09/16/2014 1546   CHOLHDL 4 09/16/2014 1546   VLDL 24.8 09/16/2014 1546   LDLCALC 136* 09/16/2014 1546       Assessment and plan:  24 year old male with a history of asthma, hypertension, uncontrolled type 1 diabetes mellitus here to establish care.  Uncontrolled type 1 diabetes mellitus: A1c 13.4, CABG 524. Poor compliance due to running out of insurance and finances as well. Urinalysis negative for ketones. NovoLog 10 units given in the clinic and patient observed for 45 minutes prior to repeat of CBG. Discontinue Novolin 70/30 and, as Lantus at 40 units as well as NovoLog sliding scale. We'll review blood sugar log at next office visit. Microalbumin today, foot exam next visit.  Asthma: And admit and with rare exacerbations which occur only in winter. Refill albuterol MDI.  Essential  hypertension: Lisinopril reordered. Low-sodium diet-diet as well as lifestyle modifications.     The patient was given clear instructions to go to ER or return to medical center if symptoms don't improve, worsen or new problems develop. The patient verbalized understanding. The patient was told to call to get lab results if they haven't heard anything in the next week.     Jaclyn Shaggy, MD. [facility] [phone] 06/29/2015, 11:49 AM

## 2015-06-29 NOTE — Progress Notes (Signed)
Type 1 diabetic  CBG today is 524 ED recently dc'd him on novolog 15 units bid with meals Took insulin this am He is not taking any other medications

## 2015-06-30 LAB — MICROALBUMIN / CREATININE URINE RATIO: CREATININE, URINE: 46.1 mg/dL

## 2015-07-01 ENCOUNTER — Telehealth: Payer: Self-pay | Admitting: *Deleted

## 2015-07-01 NOTE — Telephone Encounter (Signed)
-----   Message from Enobong Amao, MD sent at 06/30/2015  8:15 AM EDT ----- Please inform the patient that labs are normal. Thank you. 

## 2015-07-02 ENCOUNTER — Telehealth: Payer: Self-pay | Admitting: *Deleted

## 2015-07-02 NOTE — Telephone Encounter (Signed)
-----   Message from Enobong Amao, MD sent at 06/30/2015  8:15 AM EDT ----- Please inform the patient that labs are normal. Thank you. 

## 2015-07-02 NOTE — Telephone Encounter (Signed)
Verified name and date of birth and gave normal lab results 

## 2015-07-14 ENCOUNTER — Ambulatory Visit: Payer: 59 | Admitting: Family Medicine

## 2015-07-24 ENCOUNTER — Ambulatory Visit: Payer: Self-pay | Attending: Family Medicine | Admitting: Family Medicine

## 2015-07-24 ENCOUNTER — Encounter: Payer: Self-pay | Admitting: Family Medicine

## 2015-07-24 VITALS — BP 136/80 | HR 72 | Temp 97.1°F | Ht 75.0 in | Wt 269.0 lb

## 2015-07-24 DIAGNOSIS — J4520 Mild intermittent asthma, uncomplicated: Secondary | ICD-10-CM | POA: Insufficient documentation

## 2015-07-24 DIAGNOSIS — E1065 Type 1 diabetes mellitus with hyperglycemia: Secondary | ICD-10-CM | POA: Insufficient documentation

## 2015-07-24 DIAGNOSIS — Z79899 Other long term (current) drug therapy: Secondary | ICD-10-CM | POA: Insufficient documentation

## 2015-07-24 DIAGNOSIS — Z794 Long term (current) use of insulin: Secondary | ICD-10-CM | POA: Insufficient documentation

## 2015-07-24 DIAGNOSIS — I10 Essential (primary) hypertension: Secondary | ICD-10-CM | POA: Insufficient documentation

## 2015-07-24 DIAGNOSIS — F17210 Nicotine dependence, cigarettes, uncomplicated: Secondary | ICD-10-CM | POA: Insufficient documentation

## 2015-07-24 LAB — BASIC METABOLIC PANEL
BUN: 7 mg/dL (ref 7–25)
CALCIUM: 8.9 mg/dL (ref 8.6–10.3)
CO2: 29 mmol/L (ref 20–31)
Chloride: 104 mmol/L (ref 98–110)
Creat: 0.95 mg/dL (ref 0.60–1.35)
GLUCOSE: 181 mg/dL — AB (ref 65–99)
POTASSIUM: 3.8 mmol/L (ref 3.5–5.3)
SODIUM: 139 mmol/L (ref 135–146)

## 2015-07-24 LAB — GLUCOSE, POCT (MANUAL RESULT ENTRY): POC Glucose: 256 mg/dl — AB (ref 70–99)

## 2015-07-24 NOTE — Patient Instructions (Signed)
Diabetes Mellitus and Food It is important for you to manage your blood sugar (glucose) level. Your blood glucose level can be greatly affected by what you eat. Eating healthier foods in the appropriate amounts throughout the day at about the same time each day will help you control your blood glucose level. It can also help slow or prevent worsening of your diabetes mellitus. Healthy eating may even help you improve the level of your blood pressure and reach or maintain a healthy weight.  HOW CAN FOOD AFFECT ME? Carbohydrates Carbohydrates affect your blood glucose level more than any other type of food. Your dietitian will help you determine how many carbohydrates to eat at each meal and teach you how to count carbohydrates. Counting carbohydrates is important to keep your blood glucose at a healthy level, especially if you are using insulin or taking certain medicines for diabetes mellitus. Alcohol Alcohol can cause sudden decreases in blood glucose (hypoglycemia), especially if you use insulin or take certain medicines for diabetes mellitus. Hypoglycemia can be a life-threatening condition. Symptoms of hypoglycemia (sleepiness, dizziness, and disorientation) are similar to symptoms of having too much alcohol.  If your health care provider has given you approval to drink alcohol, do so in moderation and use the following guidelines:  Women should not have more than one drink per day, and men should not have more than two drinks per day. One drink is equal to:  12 oz of beer.  5 oz of wine.  1 oz of hard liquor.  Do not drink on an empty stomach.  Keep yourself hydrated. Have water, diet soda, or unsweetened iced tea.  Regular soda, juice, and other mixers might contain a lot of carbohydrates and should be counted. WHAT FOODS ARE NOT RECOMMENDED? As you make food choices, it is important to remember that all foods are not the same. Some foods have fewer nutrients per serving than other  foods, even though they might have the same number of calories or carbohydrates. It is difficult to get your body what it needs when you eat foods with fewer nutrients. Examples of foods that you should avoid that are high in calories and carbohydrates but low in nutrients include:  Trans fats (most processed foods list trans fats on the Nutrition Facts label).  Regular soda.  Juice.  Candy.  Sweets, such as cake, pie, doughnuts, and cookies.  Fried foods. WHAT FOODS CAN I EAT? Have nutrient-rich foods, which will nourish your body and keep you healthy. The food you should eat also will depend on several factors, including:  The calories you need.  The medicines you take.  Your weight.  Your blood glucose level.  Your blood pressure level.  Your cholesterol level. You also should eat a variety of foods, including:  Protein, such as meat, poultry, fish, tofu, nuts, and seeds (lean animal proteins are best).  Fruits.  Vegetables.  Dairy products, such as milk, cheese, and yogurt (low fat is best).  Breads, grains, pasta, cereal, rice, and beans.  Fats such as olive oil, trans fat-free margarine, canola oil, avocado, and olives. DOES EVERYONE WITH DIABETES MELLITUS HAVE THE SAME MEAL PLAN? Because every person with diabetes mellitus is different, there is not one meal plan that works for everyone. It is very important that you meet with a dietitian who will help you create a meal plan that is just right for you. Document Released: 08/04/2005 Document Revised: 11/12/2013 Document Reviewed: 10/04/2013 ExitCare Patient Information 2015 [employer]. This   information is not intended to replace advice given to you by your health care provider. Make sure you discuss any questions you have with your health care provider.  

## 2015-07-24 NOTE — Progress Notes (Signed)
Follow up on type 1 DM States sugars at home are in the 200's CBG today was 256 He does not need refills Reports no pain and no ETOH or illicit drugs he does smoke 5 cigarettes a day

## 2015-07-24 NOTE — Progress Notes (Signed)
Subjective:    Patient ID: Christopher Douglas, male    DOB: [birth year], 24 y.o.   MRN: [id number]  HPI Christopher Douglas is a 24 year old male with a history of uncontrolled type 1 diabetes mellitus (A1c of 13.4), hypertension, intermittent asthma here for follow-up visit; poor compliance majorly responsible for uncontrolled diabetes mellitus. At his last office visit he was switched from Novolin 70/30 to Lantus 40 units but on speaking with him he has been taking only 28 units of Lantus. He is not here with blood sugar log but reports fasting sugars have been in the 200s.  Has been compliant with Lisinopril for HTN. Denies any Asthma flare ups which typically occur in the winter for him.  He has no other complaints today.  Past Medical History  Diagnosis Date  . Asthma   . Diabetes mellitus without complication   . Hypertension     Past Surgical History  Procedure Laterality Date  . Foot surgery      Social History   Social History  . Marital Status: Single    Spouse Name: N/A  . Number of Children: N/A  . Years of Education: N/A   Occupational History  . Not on file.   Social History Main Topics  . Smoking status: Current Every Day Smoker -- 0.25 packs/day for 5 years    Types: Cigarettes    Last Attempt to Quit: 12/06/2012  . Smokeless tobacco: Not on file  . Alcohol Use: No  . Drug Use: No  . Sexual Activity: Yes    Birth Control/ Protection: Condom   Other Topics Concern  . Not on file   Social History Narrative    Allergies  Allergen Reactions  . Amoxicillin Hives    Breaks out in hives  . Shellfish Allergy Hives    Specifically shrimp unknown    Current Outpatient Prescriptions on File Prior to Visit  Medication Sig Dispense Refill  . albuterol (PROVENTIL HFA;VENTOLIN HFA) 108 (90 BASE) MCG/ACT inhaler Inhale 1-2 puffs into the lungs every 6 (six) hours as needed for wheezing. 1 Inhaler 1  . atorvastatin (LIPITOR) 10 MG tablet Take 1 tablet (10 mg total)  by mouth daily. 30 tablet 2  . glucose blood (ONETOUCH VERIO) test strip 1 each by Other route 2 (two) times daily. And lancets 2/day 100 each 12  . ibuprofen (ADVIL,MOTRIN) 200 MG tablet Take 400 mg by mouth every 6 (six) hours as needed for moderate pain.    Marland Kitchen ibuprofen (ADVIL,MOTRIN) 600 MG tablet Take 1 tablet (600 mg total) by mouth every 8 (eight) hours as needed. 30 tablet 0  . insulin aspart (NOVOLOG) 100 UNIT/ML FlexPen Inject 0-12 Units into the skin 3 (three) times daily with meals. As per sliding scale. 15 mL 2  . Insulin Glargine (LANTUS SOLOSTAR) 100 UNIT/ML Solostar Pen Inject 40 Units into the skin daily at 10 pm. 5 pen 2  . Insulin Pen Needle (ULTICARE MICRO PEN NEEDLES) 32G X 4 MM MISC 1 each by Does not apply route 4 (four) times daily -  before meals and at bedtime. 100 each 5  . lisinopril (PRINIVIL,ZESTRIL) 10 MG tablet Take 1 tablet (10 mg total) by mouth daily. 30 tablet 2   No current facility-administered medications on file prior to visit.     Review of Systems  Constitutional: Negative for activity change and appetite change.  HENT: Negative for sinus pressure and sore throat.   Eyes: Negative for visual disturbance.  Respiratory: Negative for cough, chest tightness, shortness of breath and wheezing.   Cardiovascular: Negative for chest pain and leg swelling.  Gastrointestinal: Negative for abdominal pain, diarrhea, constipation and abdominal distention.  Endocrine: Negative.   Genitourinary: Negative.  Negative for dysuria.  Musculoskeletal: Negative.  Negative for myalgias and joint swelling.  Skin: Negative for rash.  Allergic/Immunologic: Negative.   Neurological: Negative for weakness, light-headedness and numbness.  Psychiatric/Behavioral: Negative for suicidal ideas, behavioral problems and dysphoric mood.       Objective:  Filed Vitals:   07/24/15 0922  BP: 136/80  Pulse: 72  Temp: 97.1 F (36.2 C)  Height:  (1.905 m)  Weight: 269 lb  (122.018 kg)  SpO2: 98%      Physical Exam  Constitutional: He is oriented to person, place, and time. He appears well-developed and well-nourished.  Cardiovascular: Normal rate, normal heart sounds and intact distal pulses.   No murmur heard. Pulmonary/Chest: Effort normal and breath sounds normal. He has no wheezes. He has no rales. He exhibits no tenderness.  Abdominal: Soft. Bowel sounds are normal. He exhibits no distension and no mass. There is no tenderness.  Musculoskeletal: Normal range of motion.  Neurological: He is alert and oriented to person, place, and time.          Assessment & Plan:  24 year old male with a history of asthma, hypertension, uncontrolled type 1 diabetes mellitus here to establish care.  Uncontrolled type 1 diabetes mellitus: A1c 13.4, CABG 524. Poor compliance due to running out of insurance and finances as well. Emphasized the need to take 40 units of Lantus and I have written out a NovoLog sliding scale for him to follow. Blood sugar log will be reassessed at the next visit. Compliant with ADA diet, lifestyle modifications.  Asthma: Intermittent and with rare exacerbations which occur only in winter. Refill albuterol MDI.  Essential hypertension: Lisinopril reordered. Low-sodium diet-diet as well as lifestyle modifications. BMET today to monitor renal function and potassium level

## 2015-08-07 ENCOUNTER — Ambulatory Visit: Payer: Self-pay | Admitting: Family Medicine

## 2015-08-14 ENCOUNTER — Ambulatory Visit: Payer: Self-pay | Attending: Family Medicine | Admitting: Family Medicine

## 2015-08-14 ENCOUNTER — Encounter: Payer: Self-pay | Admitting: Family Medicine

## 2015-08-14 VITALS — BP 138/76 | HR 80 | Temp 98.5°F | Ht 75.0 in | Wt 272.3 lb

## 2015-08-14 DIAGNOSIS — I10 Essential (primary) hypertension: Secondary | ICD-10-CM | POA: Insufficient documentation

## 2015-08-14 DIAGNOSIS — Z720 Tobacco use: Secondary | ICD-10-CM | POA: Insufficient documentation

## 2015-08-14 DIAGNOSIS — E1065 Type 1 diabetes mellitus with hyperglycemia: Secondary | ICD-10-CM | POA: Insufficient documentation

## 2015-08-14 DIAGNOSIS — J45909 Unspecified asthma, uncomplicated: Secondary | ICD-10-CM | POA: Insufficient documentation

## 2015-08-14 LAB — GLUCOSE, POCT (MANUAL RESULT ENTRY): POC GLUCOSE: 201 mg/dL — AB (ref 70–99)

## 2015-08-14 NOTE — Patient Instructions (Signed)
Diabetes Mellitus and Food It is important for you to manage your blood sugar (glucose) level. Your blood glucose level can be greatly affected by what you eat. Eating healthier foods in the appropriate amounts throughout the day at about the same time each day will help you control your blood glucose level. It can also help slow or prevent worsening of your diabetes mellitus. Healthy eating may even help you improve the level of your blood pressure and reach or maintain a healthy weight.  HOW CAN FOOD AFFECT ME? Carbohydrates Carbohydrates affect your blood glucose level more than any other type of food. Your dietitian will help you determine how many carbohydrates to eat at each meal and teach you how to count carbohydrates. Counting carbohydrates is important to keep your blood glucose at a healthy level, especially if you are using insulin or taking certain medicines for diabetes mellitus. Alcohol Alcohol can cause sudden decreases in blood glucose (hypoglycemia), especially if you use insulin or take certain medicines for diabetes mellitus. Hypoglycemia can be a life-threatening condition. Symptoms of hypoglycemia (sleepiness, dizziness, and disorientation) are similar to symptoms of having too much alcohol.  If your health care Christopher Douglas has given you approval to drink alcohol, do so in moderation and use the following guidelines:  Women should not have more than one drink per day, and men should not have more than two drinks per day. One drink is equal to:  12 oz of beer.  5 oz of wine.  1 oz of hard liquor.  Do not drink on an empty stomach.  Keep yourself hydrated. Have water, diet soda, or unsweetened iced tea.  Regular soda, juice, and other mixers might contain a lot of carbohydrates and should be counted. WHAT FOODS ARE NOT RECOMMENDED? As you make food choices, it is important to remember that all foods are not the same. Some foods have fewer nutrients per serving than other  foods, even though they might have the same number of calories or carbohydrates. It is difficult to get your body what it needs when you eat foods with fewer nutrients. Examples of foods that you should avoid that are high in calories and carbohydrates but low in nutrients include:  Trans fats (most processed foods list trans fats on the Nutrition Facts label).  Regular soda.  Juice.  Candy.  Sweets, such as cake, pie, doughnuts, and cookies.  Fried foods. WHAT FOODS CAN I EAT? Have nutrient-rich foods, which will nourish your body and keep you healthy. The food you should eat also will depend on several factors, including:  The calories you need.  The medicines you take.  Your weight.  Your blood glucose level.  Your blood pressure level.  Your cholesterol level. You also should eat a variety of foods, including:  Protein, such as meat, poultry, fish, tofu, nuts, and seeds (lean animal proteins are best).  Fruits.  Vegetables.  Dairy products, such as milk, cheese, and yogurt (low fat is best).  Breads, grains, pasta, cereal, rice, and beans.  Fats such as olive oil, trans fat-free margarine, canola oil, avocado, and olives. DOES EVERYONE WITH DIABETES MELLITUS HAVE THE SAME MEAL PLAN? Because every person with diabetes mellitus is different, there is not one meal plan that works for everyone. It is very important that you meet with a dietitian who will help you create a meal plan that is just right for you. Document Released: 08/04/2005 Document Revised: 11/12/2013 Document Reviewed: 10/04/2013 Exit[employer] Patient Information 2015 [employer]. This   information is not intended to replace advice given to you by your health care Christopher Douglas. Make sure you discuss any questions you have with your health care Christopher Douglas.  

## 2015-08-14 NOTE — Progress Notes (Signed)
Subjective:    Patient ID: Christopher Douglas, male    DOB: [birth year], 24 y.o.   MRN: [id number]  HPI Christopher Douglas is a 24 year old male patient with uncontrolled type 1 diabetes mellitus (A1c of 13.4), hypertension, intermittent asthma here for follow-up visit.  After his last visit he started taking Lantus 40 mg and reports improvement in home blood sugars to about 160-200 postprandial and CBG in the clinic is 201. Currently working on compliant with ADA diet and exercise but his work schedule precludes him from doing so effectively. He remains on lisinopril for hypertension and his blood pressure is controlled. He denies any asthma exacerbation at this time.  No acute complaints today  Past Medical History  Diagnosis Date  . Asthma   . Diabetes mellitus without complication   . Hypertension     Past Surgical History  Procedure Laterality Date  . Foot surgery      Social History   Social History  . Marital Status: Single    Spouse Name: N/A  . Number of Children: N/A  . Years of Education: N/A   Occupational History  . Not on file.   Social History Main Topics  . Smoking status: Current Every Day Smoker -- 0.25 packs/day for 5 years    Types: Cigarettes    Last Attempt to Quit: 12/06/2012  . Smokeless tobacco: Not on file  . Alcohol Use: No  . Drug Use: No  . Sexual Activity: Yes    Birth Control/ Protection: Condom   Other Topics Concern  . Not on file   Social History Narrative    Allergies  Allergen Reactions  . Amoxicillin Hives    Breaks out in hives  . Shellfish Allergy Hives    Specifically shrimp unknown    Current Outpatient Prescriptions on File Prior to Visit  Medication Sig Dispense Refill  . albuterol (PROVENTIL HFA;VENTOLIN HFA) 108 (90 BASE) MCG/ACT inhaler Inhale 1-2 puffs into the lungs every 6 (six) hours as needed for wheezing. 1 Inhaler 1  . atorvastatin (LIPITOR) 10 MG tablet Take 1 tablet (10 mg total) by mouth daily. 30 tablet 2  .  glucose blood (ONETOUCH VERIO) test strip 1 each by Other route 2 (two) times daily. And lancets 2/day 100 each 12  . ibuprofen (ADVIL,MOTRIN) 200 MG tablet Take 400 mg by mouth every 6 (six) hours as needed for moderate pain.    Marland Kitchen ibuprofen (ADVIL,MOTRIN) 600 MG tablet Take 1 tablet (600 mg total) by mouth every 8 (eight) hours as needed. 30 tablet 0  . insulin aspart (NOVOLOG) 100 UNIT/ML FlexPen Inject 0-12 Units into the skin 3 (three) times daily with meals. As per sliding scale. 15 mL 2  . Insulin Glargine (LANTUS SOLOSTAR) 100 UNIT/ML Solostar Pen Inject 40 Units into the skin daily at 10 pm. 5 pen 2  . Insulin Pen Needle (ULTICARE MICRO PEN NEEDLES) 32G X 4 MM MISC 1 each by Does not apply route 4 (four) times daily -  before meals and at bedtime. 100 each 5  . lisinopril (PRINIVIL,ZESTRIL) 10 MG tablet Take 1 tablet (10 mg total) by mouth daily. 30 tablet 2   No current facility-administered medications on file prior to visit.      Review of Systems  Constitutional: Negative for activity change and appetite change.  HENT: Negative for sinus pressure and sore throat.   Eyes: Negative for visual disturbance.  Respiratory: Negative for cough, chest tightness and shortness of breath.  Cardiovascular: Negative for chest pain and leg swelling.  Gastrointestinal: Negative for abdominal pain, diarrhea, constipation and abdominal distention.  Endocrine: Negative.   Genitourinary: Negative for dysuria.  Musculoskeletal: Negative for myalgias and joint swelling.  Skin: Negative for rash.  Allergic/Immunologic: Negative.   Neurological: Negative for weakness, light-headedness and numbness.  Psychiatric/Behavioral: Negative for suicidal ideas and dysphoric mood.       Objective: Filed Vitals:   08/14/15 1539  BP: 138/76  Pulse: 80  Temp: 98.5 F (36.9 C)  Height:  (1.905 m)  Weight: 272 lb 4.8 oz (123.514 kg)  SpO2: 97%      Physical Exam  Constitutional: He is oriented  to person, place, and time. He appears well-developed and well-nourished.  Cardiovascular: Normal rate, normal heart sounds and intact distal pulses.   No murmur heard. Pulmonary/Chest: Effort normal and breath sounds normal. He has no wheezes. He has no rales. He exhibits no tenderness.  Abdominal: Soft. Bowel sounds are normal. He exhibits no distension and no mass. There is no tenderness.  Musculoskeletal: Normal range of motion.  Neurological: He is alert and oriented to person, place, and time.          Assessment & Plan:  24 year old male with a history of asthma, hypertension, uncontrolled type 1 diabetes mellitus here to establish care.  Uncontrolled type 1 diabetes mellitus: A1c 13.4, CABG 524. Poor compliance due to running out of insurance and finances as well. Emphasized the need to take 40 units of Lantus and I have written out a NovoLog sliding scale for him to follow. Blood sugar log will be reassessed at the next visit. Compliant with ADA diet, lifestyle modifications.  Asthma: Intermittent and with rare exacerbations which occur only in winter. Refill albuterol MDI.  Essential hypertension: Lisinopril reordered. Low-sodium diet-diet as well as lifestyle modifications. BMET today to monitor renal function and potassium level

## 2015-08-14 NOTE — Progress Notes (Signed)
Here to f/u on DM1 CBG 201 today Reports no pain or depression/anxiety Needs refills

## 2015-09-25 ENCOUNTER — Other Ambulatory Visit: Payer: Self-pay

## 2015-09-25 NOTE — Patient Outreach (Signed)
[facility] Care Management  09/25/2015  Marlene BastJamal A Shaver 11/26/1990 [id number]  Telephone call to member to schedule follow up appointment.  States that he is no longer employed with [employer] and does not have the [employer] insurance.  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 Instructed he will be discharged from the [facility] program as he is no longer eligible for the benefit. Case closed as member has dis-enrolled from coverage Dudley MajorMelissa Srihaan Mastrangelo RN, [facility] Care Management Coordinator-[facility] Care [facility] [phone]

## 2015-11-03 ENCOUNTER — Telehealth: Payer: Self-pay

## 2015-11-03 NOTE — Telephone Encounter (Signed)
Left Voice Mail for pt to call back.   RE: Flu Vaccine for 2016  

## 2015-12-07 ENCOUNTER — Encounter (HOSPITAL_COMMUNITY): Payer: Self-pay | Admitting: Family Medicine

## 2015-12-07 ENCOUNTER — Emergency Department (HOSPITAL_COMMUNITY)
Admission: EM | Admit: 2015-12-07 | Discharge: 2015-12-07 | Disposition: A | Discharge status: Home / Self Care | Payer: 59 | Attending: Emergency Medicine | Admitting: Emergency Medicine

## 2015-12-07 DIAGNOSIS — I10 Essential (primary) hypertension: Secondary | ICD-10-CM | POA: Diagnosis not present

## 2015-12-07 DIAGNOSIS — E669 Obesity, unspecified: Secondary | ICD-10-CM | POA: Insufficient documentation

## 2015-12-07 DIAGNOSIS — E119 Type 2 diabetes mellitus without complications: Secondary | ICD-10-CM | POA: Diagnosis not present

## 2015-12-07 DIAGNOSIS — J45909 Unspecified asthma, uncomplicated: Secondary | ICD-10-CM | POA: Diagnosis not present

## 2015-12-07 DIAGNOSIS — Z880 Allergy status to penicillin: Secondary | ICD-10-CM | POA: Diagnosis not present

## 2015-12-07 DIAGNOSIS — Z794 Long term (current) use of insulin: Secondary | ICD-10-CM | POA: Insufficient documentation

## 2015-12-07 DIAGNOSIS — F17210 Nicotine dependence, cigarettes, uncomplicated: Secondary | ICD-10-CM | POA: Diagnosis not present

## 2015-12-07 DIAGNOSIS — L02416 Cutaneous abscess of left lower limb: Secondary | ICD-10-CM | POA: Diagnosis present

## 2015-12-07 DIAGNOSIS — L02214 Cutaneous abscess of groin: Secondary | ICD-10-CM | POA: Diagnosis not present

## 2015-12-07 DIAGNOSIS — L0291 Cutaneous abscess, unspecified: Secondary | ICD-10-CM

## 2015-12-07 DIAGNOSIS — Z79899 Other long term (current) drug therapy: Secondary | ICD-10-CM | POA: Diagnosis not present

## 2015-12-07 LAB — CBG MONITORING, ED: GLUCOSE-CAPILLARY: 291 mg/dL — AB (ref 65–99)

## 2015-12-07 MED ORDER — LIDOCAINE-EPINEPHRINE (PF) 2 %-1:200000 IJ SOLN
10.0000 mL | Freq: Once | INTRAMUSCULAR | Status: AC
  Administered 2015-12-07: 10 mL
  Filled 2015-12-07: qty 10

## 2015-12-07 MED ORDER — SODIUM BICARBONATE 4 % IV SOLN
5.0000 mL | Freq: Once | INTRAVENOUS | Status: AC
  Administered 2015-12-07: 5 mL via SUBCUTANEOUS
  Filled 2015-12-07: qty 5

## 2015-12-07 MED FILL — !NOVOLOG FLEXPEN SYRINGE 1: 100/ML | 25 days supply | Qty: 9 | Fill #3

## 2015-12-07 MED FILL — !LANTUS SOLOSTAR 100UNITS/M: 100 | 15 days supply | Qty: 6 | Fill #3

## 2015-12-07 NOTE — ED Notes (Signed)
Pt ambulates independently and with steady gait at time of discharge. Discharge instructions and follow up information reviewed with patient. No other questions or concerns voiced at this time.  

## 2015-12-07 NOTE — ED Notes (Signed)
Suture cart and medications at bedside. 

## 2015-12-07 NOTE — ED Notes (Signed)
Pt here for abscess to inner thigh area. sts some drainage.

## 2015-12-07 NOTE — Discharge Instructions (Signed)
Please keep your wound clean and dry. Follow-up with your doctor as needed. Return to ED for any new or worsening symptoms as we discussed.  Abscess An abscess is an infected area that contains a collection of pus and debris.It can occur in almost any part of the body. An abscess is also known as a furuncle or boil. CAUSES  An abscess occurs when tissue gets infected. This can occur from blockage of oil or sweat glands, infection of hair follicles, or a minor injury to the skin. As the body tries to fight the infection, pus collects in the area and creates pressure under the skin. This pressure causes pain. People with weakened immune systems have difficulty fighting infections and get certain abscesses more often.  SYMPTOMS Usually an abscess develops on the skin and becomes a painful mass that is red, warm, and tender. If the abscess forms under the skin, you may feel a moveable soft area under the skin. Some abscesses break open (rupture) on their own, but most will continue to get worse without care. The infection can spread deeper into the body and eventually into the bloodstream, causing you to feel ill.  DIAGNOSIS  Your caregiver will take your medical history and perform a physical exam. A sample of fluid may also be taken from the abscess to determine what is causing your infection. TREATMENT  Your caregiver may prescribe antibiotic medicines to fight the infection. However, taking antibiotics alone usually does not cure an abscess. Your caregiver may need to make a small cut (incision) in the abscess to drain the pus. In some cases, gauze is packed into the abscess to reduce pain and to continue draining the area. HOME CARE INSTRUCTIONS   Only take over-the-counter or prescription medicines for pain, discomfort, or fever as directed by your caregiver.  If you were prescribed antibiotics, take them as directed. Finish them even if you start to feel better.  If gauze is used, follow your  caregiver's directions for changing the gauze.  To avoid spreading the infection:  Keep your draining abscess covered with a bandage.  Wash your hands well.  Do not share personal care items, towels, or whirlpools with others.  Avoid skin contact with others.  Keep your skin and clothes clean around the abscess.  Keep all follow-up appointments as directed by your caregiver. SEEK MEDICAL CARE IF:   You have increased pain, swelling, redness, fluid drainage, or bleeding.  You have muscle aches, chills, or a general ill feeling.  You have a fever. MAKE SURE YOU:   Understand these instructions.  Will watch your condition.  Will get help right away if you are not doing well or get worse.   This information is not intended to replace advice given to you by your health care provider. Make sure you discuss any questions you have with your health care provider.   Document Released: 08/17/2005 Document Revised: 05/08/2012 Document Reviewed: 01/20/2012 Elsevier Interactive Patient Education 2016 Elsevier Inc.  Incision and Drainage Incision and drainage is a procedure in which a sac-like structure (cystic structure) is opened and drained. The area to be drained usually contains material such as pus, fluid, or blood.  LET YOUR CAREGIVER KNOW ABOUT:   Allergies to medicine.  Medicines taken, including vitamins, herbs, eyedrops, over-the-counter medicines, and creams.  Use of steroids (by mouth or creams).  Previous problems with anesthetics or numbing medicines.  History of bleeding problems or blood clots.  Previous surgery.  Other health problems, including  diabetes and kidney problems.  Possibility of pregnancy, if this applies. RISKS AND COMPLICATIONS  Pain.  Bleeding.  Scarring.  Infection. BEFORE THE PROCEDURE  You may need to have an ultrasound or other imaging tests to see how large or deep your cystic structure is. Blood tests may also be used to  determine if you have an infection or how severe the infection is. You may need to have a tetanus shot. PROCEDURE  The affected area is cleaned with a cleaning fluid. The cyst area will then be numbed with a medicine (local anesthetic). A small incision will be made in the cystic structure. A syringe or catheter may be used to drain the contents of the cystic structure, or the contents may be squeezed out. The area will then be flushed with a cleansing solution. After cleansing the area, it is often gently packed with a gauze or another wound dressing. Once it is packed, it will be covered with gauze and tape or some other type of wound dressing. AFTER THE PROCEDURE   Often, you will be allowed to go home right after the procedure.  You may be given antibiotic medicine to prevent or heal an infection.  If the area was packed with gauze or some other wound dressing, you will likely need to come back in 1 to 2 days to get it removed.  The area should heal in about 14 days.   This information is not intended to replace advice given to you by your health care provider. Make sure you discuss any questions you have with your health care provider.   Document Released: 05/03/2001 Document Revised: 05/08/2012 Document Reviewed: 01/02/2012 Elsevier Interactive Patient Education [employer]

## 2015-12-07 NOTE — ED Provider Notes (Signed)
CSN: [id number]     Arrival date & time 12/07/15  1003 History   First MD Initiated Contact with Patient 12/07/15 1141     Chief Complaint  Patient presents with  . Abscess     (Consider location/radiation/quality/duration/timing/severity/associated sxs/prior Treatment) HPI Christopher Douglas is a 25 y.o. male history of diabetes on insulin comes in for evaluation of possible abscess. Patient reports he has had what he thinks is an abscess on the inside of his left thigh next to his groin for approximately the past 1 week. He has tried warm soaks which seem to have helped. He reports the abscess is smaller now than it was at the beginning of the week. He denies any associated fevers, chills, nausea or vomiting, abdominal pain, urinary symptoms, diarrhea or constipation. He reports overall he feels well. Pain is rated as mild. No other modifying factors.  Past Medical History  Diagnosis Date  . Asthma   . Diabetes mellitus without complication (HCC)   . Hypertension    Past Surgical History  Procedure Laterality Date  . Foot surgery     Family History  Problem Relation Age of Onset  . Diabetes Mellitus II Father   . Hypertension Father   . Alcohol abuse Sister   . Alcohol abuse Brother   . Diabetes Maternal Grandmother   . Diabetes Maternal Grandfather   . Hypertension Maternal Grandfather   . Hypertension Mother    Social History  Substance Use Topics  . Smoking status: Current Every Day Smoker -- 0.25 packs/day for 5 years    Types: Cigarettes    Last Attempt to Quit: 12/06/2012  . Smokeless tobacco: None  . Alcohol Use: No    Review of Systems A 10 point review of systems was completed and was negative except for pertinent positives and negatives as mentioned in the history of present illness     Allergies  Amoxicillin and Shellfish allergy  Home Medications   Prior to Admission medications   Medication Sig Start Date End Date Taking? Authorizing Provider  albuterol  (PROVENTIL HFA;VENTOLIN HFA) 108 (90 BASE) MCG/ACT inhaler Inhale 1-2 puffs into the lungs every 6 (six) hours as needed for wheezing. 06/29/15 11/27/17 Yes Jaclyn ShaggyEnobong Amao, MD  atorvastatin (LIPITOR) 10 MG tablet Take 1 tablet (10 mg total) by mouth daily. 06/29/15  Yes Jaclyn ShaggyEnobong Amao, MD  glucose blood (ONETOUCH VERIO) test strip 1 each by Other route 2 (two) times daily. And lancets 2/day 09/12/14  Yes Myrlene BrokerElizabeth A Crawford, MD  ibuprofen (ADVIL,MOTRIN) 600 MG tablet Take 1 tablet (600 mg total) by mouth every 8 (eight) hours as needed. 06/23/14  Yes Thao P Le, DO  insulin aspart (NOVOLOG) 100 UNIT/ML FlexPen Inject 0-12 Units into the skin 3 (three) times daily with meals. As per sliding scale. 06/29/15  Yes Jaclyn ShaggyEnobong Amao, MD  Insulin Glargine (LANTUS SOLOSTAR) 100 UNIT/ML Solostar Pen Inject 40 Units into the skin daily at 10 pm. 06/29/15  Yes Jaclyn ShaggyEnobong Amao, MD  lisinopril (PRINIVIL,ZESTRIL) 10 MG tablet Take 1 tablet (10 mg total) by mouth daily. 06/29/15  Yes Jaclyn ShaggyEnobong Amao, MD   BP 129/81 mmHg  Pulse 71  Temp(Src) 98.5 F (36.9 C) (Oral)  Resp 14  Ht 6\' 3"  (1.905 m)  Wt 124.739 kg  BMI 34.37 kg/m2  SpO2 99% Physical Exam  Constitutional: He is oriented to person, place, and time. He appears well-developed and well-nourished.  Overall well appearing, obese African-American male  HENT:  Head: Normocephalic and atraumatic.  Mouth/Throat:  Oropharynx is clear and moist.  Eyes: Conjunctivae are normal. Pupils are equal, round, and reactive to light. Right eye exhibits no discharge. Left eye exhibits no discharge. No scleral icterus.  Neck: Neck supple.  Cardiovascular: Normal rate, regular rhythm and normal heart sounds.   Pulmonary/Chest: Effort normal and breath sounds normal. No respiratory distress. He has no wheezes. He has no rales.  Abdominal: Soft. There is no tenderness.  Genitourinary:     Musculoskeletal: He exhibits no tenderness.  Neurological: He is alert and oriented to person, place, and  time.  Cranial Nerves II-XII grossly intact  Skin: Skin is warm and dry. No rash noted.  Psychiatric: He has a normal mood and affect.  Nursing note and vitals reviewed.   ED Course  Procedures (including critical care time) Labs Review Labs Reviewed  CBG MONITORING, ED - Abnormal; Notable for the following:    Glucose-Capillary 291 (*)    All other components within normal limits    Imaging Review No results found. I have personally reviewed and evaluated these images and lab results as part of my medical decision-making.  EMERGENCY DEPARTMENT US SOFT TISSUE INTERPRETATION "Study: Limited Ultrasound of the noted body part in comments below"  INDICATIONS: Soft tissue infection Multiple views of the body part are obtained with a multi-frequency linear probe  PERFORMED BY:  Myself  IMAGES ARCHIVED?: Yes  SIDE:Left  BODY PART:Other soft tisse (comment in note)  FINDINGS: Abcess present  LIMITATIONS:  Body Habitus  INTERPRETATION:  Abcess present  COMMENT:  Small abscess in L groin with no cellulitis  INCISION AND DRAINAGE Performed by: Sharlene Motts Consent: Verbal consent obtained. Risks and benefits: risks, benefits and alternatives were discussed Type: abscess  Body area: L groin  Anesthesia: local infiltration  Incision was made with a scalpel.  Local anesthetic: lidocaine 2% w epinephrine  Anesthetic total: 2 ml  Complexity: complex Blunt dissection to break up loculations  Drainage: purulent  Drainage amount: small  Packing material: none  Patient tolerance: Patient tolerated the procedure well with no immediate complications.       EKG Interpretation None     Meds given in ED:  Medications  lidocaine-EPINEPHrine (XYLOCAINE W/EPI) 2 %-1:200000 (PF) injection 10 mL (10 mLs Infiltration Given by Other 12/07/15 1303)  sodium bicarbonate (NEUT) 4 % injection 5 mL (5 mLs Subcutaneous Given by Other 12/07/15 1303)    Discharge  Medication List as of 12/07/2015  1:19 PM     Filed Vitals:   12/07/15 1200 12/07/15 1230 12/07/15 1300 12/07/15 1323  BP: 128/78 121/81 129/81 129/81  Pulse: 66 85 68 71  Temp:      TempSrc:      Resp:    14  Height:      Weight:      SpO2: 95% 97% 98% 99%    MDM  Patient with skin abscess amenable to incision and drainage.  Abscess was not large enough to warrant packing or drain,  wound recheck in 2 days. Encouraged home warm soaks and flushing.  Mild signs of cellulitis is surrounding skin.  Will d/c to home.  No antibiotic therapy is indicated.  Final diagnoses:  Abscess        Joycie Peek, PA-C 12/08/15 1335  Vanetta Mulders, MD 12/10/15 1223

## 2016-01-08 MED FILL — !NOVOLOG FLEXPEN SYRINGE 1: 100/ML | 8 days supply | Qty: 3 | Fill #4

## 2016-01-08 MED FILL — LANTUS SOLOSTAR 100 UNITS/M: 100 | 30 days supply | Qty: 12 | Fill #4

## 2016-02-29 ENCOUNTER — Other Ambulatory Visit: Payer: Self-pay | Admitting: Family Medicine

## 2016-03-02 ENCOUNTER — Other Ambulatory Visit: Payer: Self-pay

## 2016-03-02 ENCOUNTER — Other Ambulatory Visit: Payer: Self-pay | Admitting: Family Medicine

## 2016-03-02 MED ORDER — INSULIN ASPART 100 UNIT/ML FLEXPEN: 0.0000 [IU] | PEN_INJECTOR | Freq: Three times a day (TID) | SUBCUTANEOUS | Status: DC

## 2016-03-02 MED ORDER — INSULIN GLARGINE 100 UNIT/ML SOLOSTAR PEN: 40.0000 [IU] | PEN_INJECTOR | Freq: Every day | SUBCUTANEOUS | Status: DC

## 2016-03-02 MED FILL — !LANTUS SOLOSTAR 100UNITS/M: 100 | 28 days supply | Qty: 12 | Fill #0

## 2016-03-02 MED FILL — !NOVOLOG FLEXPEN SYRINGE 1: 100/ML | 30 days supply | Qty: 12 | Fill #0

## 2016-03-02 NOTE — Progress Notes (Signed)
Couldn't reach patient to inform him of his refill of his lantus and novolog pens. Patient answer machine wouldn't allow me to leave a message. Patient told Pharmacy he had an appt, but he didn't.  Patient will not be allowed to pick his insulin until he makes an appt. Will f/up with Pharmacy to deliver message to patient once he comes to pick up his meds.

## 2016-03-03 ENCOUNTER — Other Ambulatory Visit: Payer: Self-pay | Admitting: Family Medicine

## 2016-03-25 ENCOUNTER — Encounter (HOSPITAL_COMMUNITY): Payer: Self-pay | Admitting: *Deleted

## 2016-03-25 ENCOUNTER — Other Ambulatory Visit: Payer: Self-pay

## 2016-03-25 ENCOUNTER — Emergency Department (HOSPITAL_COMMUNITY)
Admission: EM | Admit: 2016-03-25 | Discharge: 2016-03-25 | Disposition: A | Discharge status: Home / Self Care | Payer: 59 | Attending: Emergency Medicine | Admitting: Emergency Medicine

## 2016-03-25 ENCOUNTER — Emergency Department (HOSPITAL_COMMUNITY): Payer: 59

## 2016-03-25 DIAGNOSIS — Z794 Long term (current) use of insulin: Secondary | ICD-10-CM | POA: Insufficient documentation

## 2016-03-25 DIAGNOSIS — J45901 Unspecified asthma with (acute) exacerbation: Secondary | ICD-10-CM | POA: Diagnosis not present

## 2016-03-25 DIAGNOSIS — I10 Essential (primary) hypertension: Secondary | ICD-10-CM | POA: Insufficient documentation

## 2016-03-25 DIAGNOSIS — E119 Type 2 diabetes mellitus without complications: Secondary | ICD-10-CM | POA: Diagnosis not present

## 2016-03-25 DIAGNOSIS — R197 Diarrhea, unspecified: Secondary | ICD-10-CM | POA: Insufficient documentation

## 2016-03-25 DIAGNOSIS — Z79899 Other long term (current) drug therapy: Secondary | ICD-10-CM | POA: Diagnosis not present

## 2016-03-25 DIAGNOSIS — R05 Cough: Secondary | ICD-10-CM | POA: Diagnosis present

## 2016-03-25 DIAGNOSIS — Z880 Allergy status to penicillin: Secondary | ICD-10-CM | POA: Diagnosis not present

## 2016-03-25 DIAGNOSIS — J069 Acute upper respiratory infection, unspecified: Secondary | ICD-10-CM | POA: Diagnosis not present

## 2016-03-25 DIAGNOSIS — F17210 Nicotine dependence, cigarettes, uncomplicated: Secondary | ICD-10-CM | POA: Diagnosis not present

## 2016-03-25 LAB — CBC WITH DIFFERENTIAL/PLATELET
Basophils Absolute: 0 10*3/uL (ref 0.0–0.1)
Basophils Relative: 0 %
EOS ABS: 0.3 10*3/uL (ref 0.0–0.7)
Eosinophils Relative: 3 %
HEMATOCRIT: 43.5 % (ref 39.0–52.0)
HEMOGLOBIN: 13.8 g/dL (ref 13.0–17.0)
LYMPHS ABS: 2.5 10*3/uL (ref 0.7–4.0)
LYMPHS PCT: 23 %
MCH: 27.8 pg (ref 26.0–34.0)
MCHC: 31.7 g/dL (ref 30.0–36.0)
MCV: 87.7 fL (ref 78.0–100.0)
MONOS PCT: 8 %
Monocytes Absolute: 0.9 10*3/uL (ref 0.1–1.0)
NEUTROS PCT: 66 %
Neutro Abs: 7 10*3/uL (ref 1.7–7.7)
Platelets: 181 10*3/uL (ref 150–400)
RBC: 4.96 MIL/uL (ref 4.22–5.81)
RDW: 14 % (ref 11.5–15.5)
WBC: 10.7 10*3/uL — ABNORMAL HIGH (ref 4.0–10.5)

## 2016-03-25 LAB — BASIC METABOLIC PANEL
Anion gap: 11 (ref 5–15)
BUN: 6 mg/dL (ref 6–20)
CHLORIDE: 100 mmol/L — AB (ref 101–111)
CO2: 25 mmol/L (ref 22–32)
CREATININE: 0.98 mg/dL (ref 0.61–1.24)
Calcium: 9.3 mg/dL (ref 8.9–10.3)
GFR calc non Af Amer: >60 mL/min (ref >60)
Glucose, Bld: 243 mg/dL — ABNORMAL HIGH (ref 65–99)
POTASSIUM: 3.9 mmol/L (ref 3.5–5.1)
Sodium: 136 mmol/L (ref 135–145)

## 2016-03-25 LAB — I-STAT TROPONIN, ED: Troponin i, poc: 0 ng/mL (ref 0.00–0.08)

## 2016-03-25 LAB — D-DIMER, QUANTITATIVE (NOT AT ARMC)

## 2016-03-25 MED ORDER — IPRATROPIUM-ALBUTEROL 0.5-2.5 (3) MG/3ML IN SOLN
3.0000 mL | Freq: Once | RESPIRATORY_TRACT | Status: AC
  Administered 2016-03-25: 3 mL via RESPIRATORY_TRACT
  Filled 2016-03-25: qty 3

## 2016-03-25 MED ORDER — ALBUTEROL SULFATE HFA 108 (90 BASE) MCG/ACT IN AERS: 2.0000 | INHALATION_SPRAY | RESPIRATORY_TRACT | Status: DC | PRN

## 2016-03-25 MED ORDER — PREDNISONE 20 MG PO TABS
40.0000 mg | ORAL_TABLET | Freq: Once | ORAL | Status: AC
  Administered 2016-03-25: 40 mg via ORAL
  Filled 2016-03-25: qty 2

## 2016-03-25 MED ORDER — ALBUTEROL SULFATE (2.5 MG/3ML) 0.083% IN NEBU
5.0000 mg | INHALATION_SOLUTION | Freq: Once | RESPIRATORY_TRACT | Status: AC
  Administered 2016-03-25: 5 mg via RESPIRATORY_TRACT
  Filled 2016-03-25: qty 6

## 2016-03-25 MED ORDER — BENZONATATE 100 MG PO CAPS: 100.0000 mg | ORAL_CAPSULE | Freq: Three times a day (TID) | ORAL | Status: DC | PRN

## 2016-03-25 MED ORDER — PREDNISONE 20 MG PO TABS: 40.0000 mg | ORAL_TABLET | Freq: Every day | ORAL | Status: DC

## 2016-03-25 MED ORDER — ALBUTEROL SULFATE HFA 108 (90 BASE) MCG/ACT IN AERS
2.0000 | INHALATION_SPRAY | Freq: Once | RESPIRATORY_TRACT | Status: AC
  Administered 2016-03-25: 2 via RESPIRATORY_TRACT
  Filled 2016-03-25: qty 6.7

## 2016-03-25 MED ORDER — STERILE WATER FOR INJECTION IJ SOLN
INTRAMUSCULAR | Status: AC
  Filled 2016-03-25: qty 10

## 2016-03-25 MED ORDER — IPRATROPIUM BROMIDE 0.02 % IN SOLN
0.5000 mg | Freq: Once | RESPIRATORY_TRACT | Status: AC
  Administered 2016-03-25: 0.5 mg via RESPIRATORY_TRACT
  Filled 2016-03-25: qty 2.5

## 2016-03-25 NOTE — Discharge Instructions (Signed)
Asthma, Adult °Asthma is a recurring condition in which the airways tighten and narrow. Asthma can make it difficult to breathe. It can cause coughing, wheezing, and shortness of breath. Asthma episodes, also called asthma attacks, range from minor to life-threatening. Asthma cannot be cured, but medicines and lifestyle changes can help control it. °CAUSES °Asthma is believed to be caused by inherited (genetic) and environmental factors, but its exact cause is unknown. Asthma may be triggered by allergens, lung infections, or irritants in the air. Asthma triggers are different for each person. Common triggers include:  °· Animal dander. °· Dust mites. °· Cockroaches. °· Pollen from trees or grass. °· Mold. °· Smoke. °· Air pollutants such as dust, household cleaners, hair sprays, aerosol sprays, paint fumes, strong chemicals, or strong odors. °· Cold air, weather changes, and winds (which increase molds and pollens in the air). °· Strong emotional expressions such as crying or laughing hard. °· Stress. °· Certain medicines (such as aspirin) or types of drugs (such as beta-blockers). °· Sulfites in foods and drinks. Foods and drinks that may contain sulfites include dried fruit, potato chips, and sparkling grape juice. °· Infections or inflammatory conditions such as the flu, a cold, or an inflammation of the nasal membranes (rhinitis). °· Gastroesophageal reflux disease (GERD). °· Exercise or strenuous activity. °SYMPTOMS °Symptoms may occur immediately after asthma is triggered or many hours later. Symptoms include: °· Wheezing. °· Excessive nighttime or early morning coughing. °· Frequent or severe coughing with a common cold. °· Chest tightness. °· Shortness of breath. °DIAGNOSIS  °The diagnosis of asthma is made by a review of your medical history and a physical exam. Tests may also be performed. These may include: °· Lung function studies. These tests show how much air you breathe in and out. °· Allergy  tests. °· Imaging tests such as X-rays. °TREATMENT  °Asthma cannot be cured, but it can usually be controlled. Treatment involves identifying and avoiding your asthma triggers. It also involves medicines. There are 2 classes of medicine used for asthma treatment:  °· Controller medicines. These prevent asthma symptoms from occurring. They are usually taken every day. °· Reliever or rescue medicines. These quickly relieve asthma symptoms. They are used as needed and provide short-term relief. °Your health care provider will help you create an asthma action plan. An asthma action plan is a written plan for managing and treating your asthma attacks. It includes a list of your asthma triggers and how they may be avoided. It also includes information on when medicines should be taken and when their dosage should be changed. An action plan may also involve the use of a device called a peak flow meter. A peak flow meter measures how well the lungs are working. It helps you monitor your condition. °HOME CARE INSTRUCTIONS  °· Take medicines only as directed by your health care provider. Speak with your health care provider if you have questions about how or when to take the medicines. °· Use a peak flow meter as directed by your health care provider. Record and keep track of readings. °· Understand and use the action plan to help minimize or stop an asthma attack without needing to seek medical care. °· Control your home environment in the following ways to help prevent asthma attacks: °· Do not smoke. Avoid being exposed to secondhand smoke. °· Change your heating and air conditioning filter regularly. °· Limit your use of fireplaces and wood stoves. °· Get rid of pests (such as roaches   and mice) and their droppings. °· Throw away plants if you see mold on them. °· Clean your floors and dust regularly. Use unscented cleaning products. °· Try to have someone else vacuum for you regularly. Stay out of rooms while they are  being vacuumed and for a short while afterward. If you vacuum, use a dust mask from a hardware store, a double-layered or microfilter vacuum cleaner bag, or a vacuum cleaner with a HEPA filter. °· Replace carpet with wood, tile, or vinyl flooring. Carpet can trap dander and dust. °· Use allergy-proof pillows, mattress covers, and box spring covers. °· Wash bed sheets and blankets every week in hot water and dry them in a dryer. °· Use blankets that are made of polyester or cotton. °· Clean bathrooms and kitchens with bleach. If possible, have someone repaint the walls in these rooms with mold-resistant paint. Keep out of the rooms that are being cleaned and painted. °· Wash hands frequently. °SEEK MEDICAL CARE IF:  °· You have wheezing, shortness of breath, or a cough even if taking medicine to prevent attacks. °· The colored mucus you cough up (sputum) is thicker than usual. °· Your sputum changes from clear or white to yellow, green, gray, or bloody. °· You have any problems that may be related to the medicines you are taking (such as a rash, itching, swelling, or trouble breathing). °· You are using a reliever medicine more than 2-3 times per week. °· Your peak flow is still at 50-79% of your personal best after following your action plan for 1 hour. °· You have a fever. °SEEK IMMEDIATE MEDICAL CARE IF:  °· You seem to be getting worse and are unresponsive to treatment during an asthma attack. °· You are short of breath even at rest. °· You get short of breath when doing very little physical activity. °· You have difficulty eating, drinking, or talking due to asthma symptoms. °· You develop chest pain. °· You develop a fast heartbeat. °· You have a bluish color to your lips or fingernails. °· You are light-headed, dizzy, or faint. °· Your peak flow is less than 50% of your personal best. °  °This information is not intended to replace advice given to you by your health care provider. Make sure you discuss any  questions you have with your health care provider. °  °Document Released: 11/07/2005 Document Revised: 07/29/2015 Document Reviewed: 06/06/2013 °Elsevier Interactive Patient Education ©2016 [employer] ° °Asthma Attack Prevention °While you may not be able to control the fact that you have asthma, you can take actions to prevent asthma attacks. The best way to prevent asthma attacks is to maintain good control of your asthma. You can achieve this by: °· Taking your medicines as directed. °· Avoiding things that can irritate your airways or make your asthma symptoms worse (asthma triggers). °· Keeping track of how well your asthma is controlled and of any changes in your symptoms. °· Responding quickly to worsening asthma symptoms (asthma attack). °· Seeking emergency care when it is needed. °WHAT ARE SOME WAYS TO PREVENT AN ASTHMA ATTACK? °Have a Plan °Work with your health care provider to create a written plan for managing and treating your asthma attacks (asthma action plan). This plan includes: °· A list of your asthma triggers and how you can avoid them. °· Information on when medicines should be taken and when their dosages should be changed. °· The use of a device that measures how well your lungs are   working (peak flow meter). °Monitor Your Asthma °Use your peak flow meter and record your results in a journal every day. A drop in your peak flow numbers on one or more days may indicate the start of an asthma attack. This can happen even before you start to feel symptoms. You can prevent an asthma attack from getting worse by following the steps in your asthma action plan. °Avoid Asthma Triggers °Work with your asthma health care provider to find out what your asthma triggers are. This can be done by: °· Allergy testing. °· Keeping a journal that notes when asthma attacks occur and the factors that may have contributed to them. °· Determining if there are other medical conditions that are making your asthma  worse. °Once you have determined your asthma triggers, take steps to avoid them. This may include avoiding excessive or prolonged exposure to: °· Dust. Have someone dust and vacuum your home for you once or twice a week. Using a high-efficiency particulate arrestance (HEPA) vacuum is best. °· Smoke. This includes campfire smoke, forest fire smoke, and secondhand smoke from tobacco products. °· Pet dander. Avoid contact with animals that you know you are allergic to. °· Allergens from trees, grasses or pollens. Avoid spending a lot of time outdoors when pollen counts are high, and on very windy days. °· Very cold, dry, or humid air. °· Mold. °· Foods that contain high amounts of sulfites. °· Strong odors. °· Outdoor air pollutants, such as engine exhaust. °· Indoor air pollutants, such as aerosol sprays and fumes from household cleaners. °· Household pests, including dust mites and cockroaches, and pest droppings. °· Certain medicines, including NSAIDs. Always talk to your health care provider before stopping or starting any new medicines. °Medicines °Take over-the-counter and prescription medicines only as told by your health care provider. Many asthma attacks can be prevented by carefully following your medicine schedule. Taking your medicines correctly is especially important when you cannot avoid certain asthma triggers. °Act Quickly °If an asthma attack does happen, acting quickly can decrease how severe it is and how long it lasts. Take these steps:  °· Pay attention to your symptoms. If you are coughing, wheezing, or having difficulty breathing, do not wait to see if your symptoms go away on their own. Follow your asthma action plan. °· If you have followed your asthma action plan and your symptoms are not improving, call your health care provider or seek immediate medical care at the nearest hospital. °It is important to note how often you need to use your fast-acting rescue inhaler. If you are using your  rescue inhaler more often, it may mean that your asthma is not under control. Adjusting your asthma treatment plan may help you to prevent future asthma attacks and help you to gain better control of your condition. °HOW CAN I PREVENT AN ASTHMA ATTACK WHEN I EXERCISE? °Follow advice from your health care provider about whether you should use your fast-acting inhaler before exercising. Many people with asthma experience exercise-induced bronchoconstriction (EIB). This condition often worsens during vigorous exercise in cold, humid, or dry environments. Usually, people with EIB can stay very active by pre-treating with a fast-acting inhaler before exercising. °  °This information is not intended to replace advice given to you by your health care provider. Make sure you discuss any questions you have with your health care provider. °  °Document Released: 10/26/2009 Document Revised: 07/29/2015 Document Reviewed: 04/09/2015 °Elsevier Interactive Patient Education ©2016 [employer] Inc[employer] ° °

## 2016-03-25 NOTE — ED Provider Notes (Signed)
CSN: [id number]     Arrival date & time 03/25/16  1258 History  By signing my name below, I, Iona BeardChristian Pulliam, attest that this documentation has been prepared under the direction and in the presence of Everlene FarrierWilliam Jamontae Thwaites, PA-C.   Electronically Signed: Iona Beardhristian Pulliam, ED Scribe 03/25/2016 at 5:40 PM.  Chief Complaint  Patient presents with  . Cough   The history is provided by the patient. No language interpreter was used.   HPI Comments: Marlene BastJamal A Sheard is a 25 y.o. male with PMHx of asthma, DM, and HTN who presents to the Emergency Department complaining of gradual onset, worsening, productive cough with yellow/green phlegm, ongoing for one week. Pt reports associated chest tightness, wheezing, and one episode of diarrhea yesterday. He reports pain in chest with coughing. He believes there was some blood streaked in his sputum today with coughing.  No other associated symptoms noted. No worsening or alleviating factors noted. Pt denies fever, chills, vomiting, nausea, chest pain, abdominal pain, or any other pertinent symptoms. No leg pain or leg swelling. No recent long travel. No personal or close family history of PEs or DVTs. The patient is a smoker.   Past Medical History  Diagnosis Date  . Asthma   . Diabetes mellitus without complication (HCC)   . Hypertension    Past Surgical History  Procedure Laterality Date  . Foot surgery     Family History  Problem Relation Age of Onset  . Diabetes Mellitus II Father   . Hypertension Father   . Alcohol abuse Sister   . Alcohol abuse Brother   . Diabetes Maternal Grandmother   . Diabetes Maternal Grandfather   . Hypertension Maternal Grandfather   . Hypertension Mother    Social History  Substance Use Topics  . Smoking status: Current Every Day Smoker -- 0.25 packs/day for 5 years    Types: Cigarettes    Last Attempt to Quit: 12/06/2012  . Smokeless tobacco: None  . Alcohol Use: No    Review of Systems  Constitutional: Negative  for fever and chills.  HENT: Negative for congestion, rhinorrhea and sore throat.   Eyes: Negative for visual disturbance.  Respiratory: Positive for cough, chest tightness, shortness of breath and wheezing.   Cardiovascular: Negative for chest pain, palpitations and leg swelling.  Gastrointestinal: Positive for diarrhea. Negative for nausea, vomiting, abdominal pain and blood in stool.  Genitourinary: Negative for dysuria.  Musculoskeletal: Negative for back pain and neck pain.  Skin: Negative for rash.  Neurological: Negative for dizziness, light-headedness and headaches.     Allergies  Amoxicillin and Shellfish allergy  Home Medications   Prior to Admission medications   Medication Sig Start Date End Date Taking? Authorizing Provider  albuterol (PROVENTIL HFA;VENTOLIN HFA) 108 (90 Base) MCG/ACT inhaler Inhale 2 puffs into the lungs every 4 (four) hours as needed for wheezing or shortness of breath. 03/25/16   Everlene FarrierWilliam Gerardo Caiazzo, PA-C  atorvastatin (LIPITOR) 10 MG tablet Take 1 tablet (10 mg total) by mouth daily. 06/29/15   Jaclyn ShaggyEnobong Amao, MD  benzonatate (TESSALON) 100 MG capsule Take 1 capsule (100 mg total) by mouth 3 (three) times daily as needed for cough. 03/25/16   Everlene FarrierWilliam Marik Sedore, PA-C  glucose blood (ONETOUCH VERIO) test strip 1 each by Other route 2 (two) times daily. And lancets 2/day 09/12/14   Myrlene BrokerElizabeth A Crawford, MD  ibuprofen (ADVIL,MOTRIN) 600 MG tablet Take 1 tablet (600 mg total) by mouth every 8 (eight) hours as needed. 06/23/14   Thao P  Le, DO  insulin aspart (NOVOLOG) 100 UNIT/ML FlexPen Inject 0-12 Units into the skin 3 (three) times daily with meals. As per sliding scale. 03/02/16   Jaclyn Shaggy, MD  Insulin Glargine (LANTUS SOLOSTAR) 100 UNIT/ML Solostar Pen Inject 40 Units into the skin daily at 10 pm. 03/02/16   Jaclyn Shaggy, MD  lisinopril (PRINIVIL,ZESTRIL) 10 MG tablet Take 1 tablet (10 mg total) by mouth daily. 06/29/15   Jaclyn Shaggy, MD  predniSONE (DELTASONE) 20 MG  tablet Take 2 tablets (40 mg total) by mouth daily. 03/25/16   Everlene Farrier, PA-C   BP 138/87 mmHg  Pulse 120  Temp(Src) 98.8 F (37.1 C) (Oral)  Resp 22  Wt 280 lb 6.4 oz (127.189 kg)  SpO2 99% Physical Exam  Constitutional: He appears well-developed and well-nourished. No distress.  Nontoxic appearing.  HENT:  Head: Normocephalic and atraumatic.  Mouth/Throat: Oropharynx is clear and moist. No oropharyngeal exudate.  Eyes: Conjunctivae are normal. Pupils are equal, round, and reactive to light. Right eye exhibits no discharge. Left eye exhibits no discharge.  Neck: Normal range of motion. Neck supple. No JVD present. No tracheal deviation present.  Cardiovascular: Regular rhythm, normal heart sounds and intact distal pulses.   Heart rate is 114.  Pulmonary/Chest: Effort normal. No stridor. No respiratory distress. He has wheezes. He has no rales.  Bilateral wheezing with diminished lung sounds to his bilateral bases. No rhonchi or rales. No increased work of breathing. No tachypneia.   Abdominal: Soft. There is no tenderness. There is no guarding.  Musculoskeletal: He exhibits no edema or tenderness.  No lower extremity edema or tenderness.  Lymphadenopathy:    He has no cervical adenopathy.  Neurological: He is alert. Coordination normal.  Skin: Skin is warm and dry. No rash noted. He is not diaphoretic. No erythema. No pallor.  Psychiatric: He has a normal mood and affect. His behavior is normal.  Nursing note and vitals reviewed.   ED Course  Procedures (including critical care time) DIAGNOSTIC STUDIES: Oxygen Saturation is 99% on RA, normal by my interpretation.    COORDINATION OF CARE: 1:35 PM Discussed treatment plan which includes CXR, atrovent, D-dimer, BMP, CBC with differential, EKG, and proventil with pt at bedside and pt agreed to plan.  Labs Review Labs Reviewed  BASIC METABOLIC PANEL - Abnormal; Notable for the following:    Chloride 100 (*)    Glucose, Bld  243 (*)    All other components within normal limits  CBC WITH DIFFERENTIAL/PLATELET - Abnormal; Notable for the following:    WBC 10.7 (*)    All other components within normal limits  D-DIMER, QUANTITATIVE (NOT AT [facility])  Rosezena Sensor, ED    Imaging Review Dg Chest 2 View  03/25/2016  CLINICAL DATA:  Chest pain, weakness, cough. EXAM: CHEST  2 VIEW COMPARISON:  04/13/2012 FINDINGS: The heart size and mediastinal contours are within normal limits. Both lungs are clear. The visualized skeletal structures are unremarkable. IMPRESSION: No active cardiopulmonary disease. Electronically Signed   By: Charlett Nose M.D.   On: 03/25/2016 13:19   I have personally reviewed and evaluated these images and lab results as part of my medical decision-making.   EKG Interpretation   Date/Time:  Friday Mar 25 2016 13:40:53 EDT Ventricular Rate:  117 PR Interval:  148 QRS Duration: 94 QT Interval:  314 QTC Calculation: 438 R Axis:   92 Text Interpretation:  Sinus tachycardia Possible Left atrial enlargement  Rightward axis Incomplete right bundle branch block  Borderline ECG [id number]  pattern seen No old tracing to compare Confirmed by Rhunette Croft, MD, Janey Genta  [phone]) on 03/25/2016 1:45:28 PM Also confirmed by Rhunette Croft, MD, ANKIT  [phone]), editor Whitney Post, Cala Bradford [phone])  on 03/25/2016 1:56:15 PM     Filed Vitals:   03/25/16 1303 03/25/16 1722  BP: 138/87 122/73  Pulse: 120 113  Temp: 98.8 F (37.1 C) 99.2 F (37.3 C)  TempSrc: Oral Oral  Resp: 22 22  Weight: 127.189 kg   SpO2: 99% 99%    MDM   Meds given in ED:  Medications  ipratropium (ATROVENT) nebulizer solution 0.5 mg (0.5 mg Nebulization Given 03/25/16 1348)  albuterol (PROVENTIL) (2.5 MG/3ML) 0.083% nebulizer solution 5 mg (5 mg Nebulization Given 03/25/16 1348)  ipratropium-albuterol (DUONEB) 0.5-2.5 (3) MG/3ML nebulizer solution 3 mL (3 mLs Nebulization Given 03/25/16 1625)  predniSONE (DELTASONE) tablet 40 mg (40 mg Oral Given 03/25/16  1625)  albuterol (PROVENTIL HFA;VENTOLIN HFA) 108 (90 Base) MCG/ACT inhaler 2 puff (2 puffs Inhalation Given 03/25/16 1723)    Discharge Medication List as of 03/25/2016  5:18 PM    START taking these medications   Details  benzonatate (TESSALON) 100 MG capsule Take 1 capsule (100 mg total) by mouth 3 (three) times daily as needed for cough., Starting 03/25/2016, Until Discontinued, Print    predniSONE (DELTASONE) 20 MG tablet Take 2 tablets (40 mg total) by mouth daily., Starting 03/25/2016, Until Discontinued, Print        Final diagnoses:  Asthma exacerbation  URI (upper respiratory infection)   This  is a 25 y.o. male with PMHx of asthma, DM, and HTN who presents to the Emergency Department complaining of gradual onset, worsening, productive cough with yellow/green phlegm, ongoing for one week. Pt reports associated chest tightness, wheezing, and one episode of diarrhea yesterday. He reports pain in chest with coughing. He believes there was some blood streaked in his sputum today with coughing.  No other associated symptoms noted.  On exam the patient is afebrile nontoxic appearing. He has wheezes noted bilaterally and is diminished in his bilateral bases. EKG was done as the patient was tachycardic. It showed evidence of S1 every 3 T3 pattern on EKG. No evidence of STEMI. As the patient has had coughing, shortness of breath, tachycardia and some possible hemoptysis will obtain a d-dimer to rule out PE. Patient received breathing treatment and reports great improvement after treatment. Troponin is 0. D-dimer is not elevated. BMP was remarkable only for glucose of 243 within normal. CBC is unremarkable. Chest x-ray is unremarkable. This patient has had significant improvement with breathing treatment and he has a history of asthma, this appears to be an asthma exacerbation. He denies any chest pain. No SOB after breathing treatments. Patient is provided with prednisone in the emergency department.  Will discharge with an albuterol inhaler. Also provided with a steroidal bursts, Tessalon Perles and a refill for an albuterol inhaler at discharge. I encouraged close follow-up by his primary care provider. I advised the patient to follow-up with their primary care provider this week. I advised the patient to return to the emergency department with new or worsening symptoms or new concerns. The patient verbalized understanding and agreement with plan.    I personally performed the services described in this documentation, which was scribed in my presence. The recorded information has been reviewed and is accurate.       Everlene Farrier, PA-C 03/25/16 1746  Glynn Octave, MD 03/25/16 1754

## 2016-03-25 NOTE — ED Notes (Signed)
Pt reports productive cough x 1 week that is getting progressively worse. Reports sputum is yellow/green and now blood tinged and having back and chest wall pain when coughing. No acute resp distress noted at this time.

## 2016-03-28 MED FILL — ULTICARE PEN NDL 4MM 32G: 32G X 4 MM | 25 days supply | Qty: 100 | Fill #1

## 2016-03-28 MED FILL — !LANTUS SOLOSTAR 100UNITS/M: 100 | 6 days supply | Qty: 3 | Fill #1

## 2016-03-28 MED FILL — !NOVOLOG FLEXPEN SYRINGE 1: 100/ML | 7 days supply | Qty: 3 | Fill #1

## 2016-04-06 ENCOUNTER — Other Ambulatory Visit: Payer: Self-pay | Admitting: *Deleted

## 2016-04-06 ENCOUNTER — Ambulatory Visit: Payer: 59 | Attending: Family Medicine | Admitting: Family Medicine

## 2016-04-06 ENCOUNTER — Encounter: Payer: Self-pay | Admitting: Family Medicine

## 2016-04-06 VITALS — BP 130/78 | HR 72 | Temp 98.1°F | Resp 18 | Ht 75.0 in | Wt 277.0 lb

## 2016-04-06 DIAGNOSIS — I10 Essential (primary) hypertension: Secondary | ICD-10-CM | POA: Diagnosis not present

## 2016-04-06 DIAGNOSIS — Z79899 Other long term (current) drug therapy: Secondary | ICD-10-CM | POA: Diagnosis not present

## 2016-04-06 DIAGNOSIS — E785 Hyperlipidemia, unspecified: Secondary | ICD-10-CM | POA: Diagnosis not present

## 2016-04-06 DIAGNOSIS — Z794 Long term (current) use of insulin: Secondary | ICD-10-CM | POA: Insufficient documentation

## 2016-04-06 DIAGNOSIS — E1049 Type 1 diabetes mellitus with other diabetic neurological complication: Secondary | ICD-10-CM | POA: Diagnosis not present

## 2016-04-06 DIAGNOSIS — E109 Type 1 diabetes mellitus without complications: Secondary | ICD-10-CM

## 2016-04-06 DIAGNOSIS — E1069 Type 1 diabetes mellitus with other specified complication: Secondary | ICD-10-CM | POA: Insufficient documentation

## 2016-04-06 LAB — GLUCOSE, POCT (MANUAL RESULT ENTRY)
POC Glucose: 318 mg/dl — AB (ref 70–99)
POC Glucose: 245 mg/dl — AB (ref 70–99)

## 2016-04-06 LAB — POCT URINALYSIS DIPSTICK
BILIRUBIN UA: NEGATIVE
Blood, UA: NEGATIVE
Glucose, UA: 500
KETONES UA: NEGATIVE
LEUKOCYTES UA: NEGATIVE
Nitrite, UA: NEGATIVE
Spec Grav, UA: 1.01
Urobilinogen, UA: 0.2
pH, UA: 6

## 2016-04-06 MED ORDER — GABAPENTIN 300 MG PO CAPS
300.0000 mg | ORAL_CAPSULE | Freq: Two times a day (BID) | ORAL | Status: DC
Start: 2016-04-06 — End: 2017-05-12

## 2016-04-06 MED ORDER — INSULIN GLARGINE 100 UNIT/ML SOLOSTAR PEN: 40.0000 [IU] | PEN_INJECTOR | Freq: Every day | SUBCUTANEOUS | Status: DC

## 2016-04-06 MED ORDER — INSULIN ASPART 100 UNIT/ML FLEXPEN: 0.0000 [IU] | PEN_INJECTOR | Freq: Three times a day (TID) | SUBCUTANEOUS | Status: DC

## 2016-04-06 MED ORDER — INSULIN ASPART 100 UNIT/ML ~~LOC~~ SOLN
8.0000 [IU] | Freq: Once | SUBCUTANEOUS | Status: AC
  Administered 2016-04-06: 8 [IU] via SUBCUTANEOUS

## 2016-04-06 MED ORDER — LISINOPRIL 10 MG PO TABS: 10.0000 mg | ORAL_TABLET | Freq: Every day | ORAL | Status: DC

## 2016-04-06 MED ORDER — ATORVASTATIN CALCIUM 10 MG PO TABS: 10.0000 mg | ORAL_TABLET | Freq: Every day | ORAL | Status: DC

## 2016-04-06 MED ORDER — CONTOUR NEXT ONE KIT: 1.0000 | PACK | Freq: Three times a day (TID) | Status: DC

## 2016-04-06 NOTE — Patient Instructions (Signed)
Diabetes Mellitus and Food It is important for you to manage your blood sugar (glucose) level. Your blood glucose level can be greatly affected by what you eat. Eating healthier foods in the appropriate amounts throughout the day at about the same time each day will help you control your blood glucose level. It can also help slow or prevent worsening of your diabetes mellitus. Healthy eating may even help you improve the level of your blood pressure and reach or maintain a healthy weight.  General recommendations for healthful eating and cooking habits include:  Eating meals and snacks regularly. Avoid going long periods of time without eating to lose weight.  Eating a diet that consists mainly of plant-based foods, such as fruits, vegetables, nuts, legumes, and whole grains.  Using low-heat cooking methods, such as baking, instead of high-heat cooking methods, such as deep frying. Work with your dietitian to make sure you understand how to use the Nutrition Facts information on food labels. HOW CAN FOOD AFFECT ME? Carbohydrates Carbohydrates affect your blood glucose level more than any other type of food. Your dietitian will help you determine how many carbohydrates to eat at each meal and teach you how to count carbohydrates. Counting carbohydrates is important to keep your blood glucose at a healthy level, especially if you are using insulin or taking certain medicines for diabetes mellitus. Alcohol Alcohol can cause sudden decreases in blood glucose (hypoglycemia), especially if you use insulin or take certain medicines for diabetes mellitus. Hypoglycemia can be a life-threatening condition. Symptoms of hypoglycemia (sleepiness, dizziness, and disorientation) are similar to symptoms of having too much alcohol.  If your health care provider has given you approval to drink alcohol, do so in moderation and use the following guidelines:  Women should not have more than one drink per day, and men  should not have more than two drinks per day. One drink is equal to:  12 oz of beer.  5 oz of wine.  1 oz of hard liquor.  Do not drink on an empty stomach.  Keep yourself hydrated. Have water, diet soda, or unsweetened iced tea.  Regular soda, juice, and other mixers might contain a lot of carbohydrates and should be counted. WHAT FOODS ARE NOT RECOMMENDED? As you make food choices, it is important to remember that all foods are not the same. Some foods have fewer nutrients per serving than other foods, even though they might have the same number of calories or carbohydrates. It is difficult to get your body what it needs when you eat foods with fewer nutrients. Examples of foods that you should avoid that are high in calories and carbohydrates but low in nutrients include:  Trans fats (most processed foods list trans fats on the Nutrition Facts label).  Regular soda.  Juice.  Candy.  Sweets, such as cake, pie, doughnuts, and cookies.  Fried foods. WHAT FOODS CAN I EAT? Eat nutrient-rich foods, which will nourish your body and keep you healthy. The food you should eat also will depend on several factors, including:  The calories you need.  The medicines you take.  Your weight.  Your blood glucose level.  Your blood pressure level.  Your cholesterol level. You should eat a variety of foods, including:  Protein.  Lean cuts of meat.  Proteins low in saturated fats, such as fish, egg whites, and beans. Avoid processed meats.  Fruits and vegetables.  Fruits and vegetables that may help control blood glucose levels, such as apples, mangoes, and   yams.  Dairy products.  Choose fat-free or low-fat dairy products, such as milk, yogurt, and cheese.  Grains, bread, pasta, and rice.  Choose whole grain products, such as multigrain bread, whole oats, and brown rice. These foods may help control blood pressure.  Fats.  Foods containing healthful fats, such as nuts,  avocado, olive oil, canola oil, and fish. DOES EVERYONE WITH DIABETES MELLITUS HAVE THE SAME MEAL PLAN? Because every person with diabetes mellitus is different, there is not one meal plan that works for everyone. It is very important that you meet with a dietitian who will help you create a meal plan that is just right for you.   This information is not intended to replace advice given to you by your health care provider. Make sure you discuss any questions you have with your health care provider.   Document Released: 08/04/2005 Document Revised: 11/28/2014 Document Reviewed: 10/04/2013 Elsevier Interactive Patient Education 2016 [employer] Inc[employer]  

## 2016-04-06 NOTE — Progress Notes (Signed)
Patient is here for FU DM  Patient denies pain at this time.  Patient has taken insulin at 7am which was 3 Units. Patient has eaten today.  Patient tolerated 8 units of insulin well.
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
## 2016-04-07 LAB — HEMOGLOBIN A1C
HEMOGLOBIN A1C: 12.8 % — AB (ref <5.7)
MEAN PLASMA GLUCOSE: 321 mg/dL

## 2016-04-07 LAB — MICROALBUMIN / CREATININE URINE RATIO
CREATININE, URINE: 197 mg/dL (ref 20–370)
MICROALB/CREAT RATIO: 14 ug/mg{creat} (ref <30)
Microalb, Ur: 2.8 mg/dL

## 2016-04-13 ENCOUNTER — Other Ambulatory Visit: Payer: 59

## 2016-06-10 ENCOUNTER — Emergency Department (HOSPITAL_COMMUNITY): Payer: 59

## 2016-06-10 ENCOUNTER — Emergency Department (HOSPITAL_COMMUNITY)
Admission: EM | Admit: 2016-06-10 | Discharge: 2016-06-10 | Disposition: A | Discharge status: Home / Self Care | Payer: 59 | Attending: Emergency Medicine | Admitting: Emergency Medicine

## 2016-06-10 ENCOUNTER — Encounter (HOSPITAL_COMMUNITY): Payer: Self-pay | Admitting: Emergency Medicine

## 2016-06-10 DIAGNOSIS — J45909 Unspecified asthma, uncomplicated: Secondary | ICD-10-CM | POA: Insufficient documentation

## 2016-06-10 DIAGNOSIS — S99911A Unspecified injury of right ankle, initial encounter: Secondary | ICD-10-CM | POA: Diagnosis present

## 2016-06-10 DIAGNOSIS — Y999 Unspecified external cause status: Secondary | ICD-10-CM | POA: Insufficient documentation

## 2016-06-10 DIAGNOSIS — S93401A Sprain of unspecified ligament of right ankle, initial encounter: Secondary | ICD-10-CM | POA: Insufficient documentation

## 2016-06-10 DIAGNOSIS — I10 Essential (primary) hypertension: Secondary | ICD-10-CM | POA: Diagnosis not present

## 2016-06-10 DIAGNOSIS — Z794 Long term (current) use of insulin: Secondary | ICD-10-CM | POA: Insufficient documentation

## 2016-06-10 DIAGNOSIS — Y939 Activity, unspecified: Secondary | ICD-10-CM | POA: Diagnosis not present

## 2016-06-10 DIAGNOSIS — Y929 Unspecified place or not applicable: Secondary | ICD-10-CM | POA: Diagnosis not present

## 2016-06-10 DIAGNOSIS — W010XXA Fall on same level from slipping, tripping and stumbling without subsequent striking against object, initial encounter: Secondary | ICD-10-CM | POA: Insufficient documentation

## 2016-06-10 DIAGNOSIS — F17210 Nicotine dependence, cigarettes, uncomplicated: Secondary | ICD-10-CM | POA: Diagnosis not present

## 2016-06-10 DIAGNOSIS — E119 Type 2 diabetes mellitus without complications: Secondary | ICD-10-CM | POA: Insufficient documentation

## 2016-06-10 MED ORDER — IBUPROFEN 800 MG PO TABS: 800.0000 mg | ORAL_TABLET | Freq: Three times a day (TID) | ORAL | Status: DC | PRN

## 2016-06-10 NOTE — ED Notes (Signed)
Pt to xray

## 2016-06-10 NOTE — ED Notes (Signed)
Pt presents to ED for assessment of right ankle pain after he had some dizziness and fell on Monday.  Pt sts pain radiates up from his ankle to his right knee and is having increased pain with ambulation.

## 2016-06-10 NOTE — ED Provider Notes (Signed)
CSN: [id number]     Arrival date & time 06/10/16  1811 History  By signing my name below, I, Soijett Blue, attest that this documentation has been prepared under the direction and in the presence of Irena Cords, [employer] Electronically Signed: [place], ED Scribe. 06/10/2016. 6:25 PM.   Chief Complaint  Patient presents with  . Ankle Pain     The history is provided by the patient. No language interpreter was used.    Christopher Douglas is a 25 y.o. male with a medical hx of DM, HTN, who presents to the Emergency Department complaining of right ankle pain onset 5 days. Pt reports that he recently started a new medication and he became dizzy causing him to trip, fall, and injure his right ankle. Pt states that his right ankle pain radiates to his right knee. Pt denies any worsening or alleviating factors. Pt is having associated symptoms of mildly resolved right ankle swelling. He notes that he has not tried any medications for the relief of his symptoms. He denies color change, wound, rash, right knee pain, dizziness, gait problem, and any other symptoms.    Past Medical History  Diagnosis Date  . Asthma   . Diabetes mellitus without complication ([place])   . Hypertension    Past Surgical History  Procedure Laterality Date  . Foot surgery     Family History  Problem Relation Age of Onset  . Diabetes Mellitus II Father   . Hypertension Father   . Alcohol abuse Sister   . Alcohol abuse Brother   . Diabetes Maternal Grandmother   . Diabetes Maternal Grandfather   . Hypertension Maternal Grandfather   . Hypertension Mother    Social History  Substance Use Topics  . Smoking status: Current Every Day Smoker -- 0.25 packs/day for 5 years    Types: Cigarettes  . Smokeless tobacco: Not on file  . Alcohol Use: No    Review of Systems  Musculoskeletal: Positive for joint swelling (mildly resolved right ankle) and arthralgias (right ankle). Negative for gait problem.  Skin: Negative for  color change, rash and wound.  Neurological: Negative for dizziness.      Allergies  Amoxicillin and Shellfish allergy  Home Medications   Prior to Admission medications   Medication Sig Start Date End Date Taking? Authorizing Provider  albuterol (PROVENTIL HFA;VENTOLIN HFA) 108 (90 Base) MCG/ACT inhaler Inhale 2 puffs into the lungs every 4 (four) hours as needed for wheezing or shortness of breath. 03/25/16   Waynetta Pean, PA-C  atorvastatin (LIPITOR) 10 MG tablet Take 1 tablet (10 mg total) by mouth daily. 04/06/16   Arnoldo Morale, MD  Blood Glucose Monitoring Suppl (CONTOUR NEXT ONE) KIT 1 each by Does not apply route 3 (three) times daily before meals. 04/06/16   Arnoldo Morale, MD  gabapentin (NEURONTIN) 300 MG capsule Take 1 capsule (300 mg total) by mouth 2 (two) times daily. 04/06/16   Arnoldo Morale, MD  glucose blood (ONETOUCH VERIO) test strip 1 each by Other route 2 (two) times daily. And lancets 2/day 09/12/14   Hoyt Koch, MD  ibuprofen (ADVIL,MOTRIN) 600 MG tablet Take 1 tablet (600 mg total) by mouth every 8 (eight) hours as needed. 06/23/14   Thao P Le, DO  insulin aspart (NOVOLOG) 100 UNIT/ML FlexPen Inject 0-12 Units into the skin 3 (three) times daily with meals. As per sliding scale. 04/06/16   Arnoldo Morale, MD  Insulin Glargine (LANTUS SOLOSTAR) 100 UNIT/ML Solostar Pen Inject 40  Units into the skin daily at 10 pm. 04/06/16   Arnoldo Morale, MD  lisinopril (PRINIVIL,ZESTRIL) 10 MG tablet Take 1 tablet (10 mg total) by mouth daily. 04/06/16   Arnoldo Morale, MD   There were no vitals taken for this visit. Physical Exam  Constitutional: He is oriented to person, place, and time. He appears well-developed and well-nourished. No distress.  HENT:  Head: Normocephalic and atraumatic.  Eyes: EOM are normal.  Neck: Neck supple.  Cardiovascular: Normal rate.   Pulmonary/Chest: Effort normal. No respiratory distress.  Abdominal: He exhibits no distension.  Musculoskeletal:  Normal range of motion.       Right ankle: He exhibits swelling. Tenderness. Achilles tendon normal.  Generalized swelling about right ankle with general tenderness. No achilles tendon tenderness.   Neurological: He is alert and oriented to person, place, and time.  Skin: Skin is warm and dry.  Psychiatric: He has a normal mood and affect. His behavior is normal.  Nursing note and vitals reviewed.   ED Course  Procedures (including critical care time) DIAGNOSTIC STUDIES: Oxygen Saturation is 100% on RA, nl by my interpretation.    COORDINATION OF CARE: 6:22 PM Discussed treatment plan with pt at bedside which includes right ankle xray and pt agreed to plan.    Imaging Review Dg Ankle Complete Right  06/10/2016  CLINICAL DATA:  Right ankle injury, persistent anterior radiating ankle pain EXAM: RIGHT ANKLE - COMPLETE 3+ VIEW COMPARISON:  None available FINDINGS: Diffuse soft tissue swelling. Normal alignment without acute osseous finding or fracture. Malleoli, talus and calcaneus appear intact. Os trigonum noted posteriorly. IMPRESSION: Soft tissue swelling without acute osseous finding Electronically Signed   By: Jerilynn Mages.  Shick M.D.   On: 06/10/2016 19:51   I have personally reviewed and evaluated these images as part of my medical decision-making.   MDM   Final diagnoses:  None    I personally performed the services described in this documentation, which was scribed in my presence. The recorded information has been reviewed and is accurate.   Dalia Heading, PA-C 06/13/16 0127  Elnora Morrison, MD 06/21/16 [phone]

## 2016-06-10 NOTE — Discharge Instructions (Signed)
Return here as needed. Follow up as needed with the orthopedist

## 2016-06-10 NOTE — ED Notes (Signed)
Patient able to ambulate independently  

## 2016-09-08 ENCOUNTER — Other Ambulatory Visit: Payer: Self-pay | Admitting: Pharmacist

## 2016-09-08 MED ORDER — INSULIN LISPRO 100 UNIT/ML (KWIKPEN): PEN_INJECTOR | SUBCUTANEOUS | 3 refills | Status: DC

## 2016-10-12 ENCOUNTER — Other Ambulatory Visit: Payer: Self-pay | Admitting: Pharmacist

## 2016-10-12 MED ORDER — INSULIN LISPRO 100 UNIT/ML (KWIKPEN): PEN_INJECTOR | SUBCUTANEOUS | 2 refills | Status: DC

## 2017-02-20 ENCOUNTER — Other Ambulatory Visit: Payer: Self-pay | Admitting: Pharmacist

## 2017-02-20 MED ORDER — INSULIN LISPRO 100 UNIT/ML (KWIKPEN): 0.0000 [IU] | PEN_INJECTOR | Freq: Three times a day (TID) | SUBCUTANEOUS | 0 refills | Status: DC

## 2017-04-13 ENCOUNTER — Emergency Department (HOSPITAL_COMMUNITY)
Admission: EM | Admit: 2017-04-13 | Discharge: 2017-04-13 | Disposition: A | Discharge status: Home / Self Care | Payer: Managed Care, Other (non HMO) | Attending: Emergency Medicine | Admitting: Emergency Medicine

## 2017-04-13 ENCOUNTER — Encounter (HOSPITAL_COMMUNITY): Payer: Self-pay | Admitting: Emergency Medicine

## 2017-04-13 ENCOUNTER — Emergency Department (HOSPITAL_COMMUNITY): Payer: Managed Care, Other (non HMO)

## 2017-04-13 DIAGNOSIS — E1040 Type 1 diabetes mellitus with diabetic neuropathy, unspecified: Secondary | ICD-10-CM | POA: Diagnosis not present

## 2017-04-13 DIAGNOSIS — F17210 Nicotine dependence, cigarettes, uncomplicated: Secondary | ICD-10-CM | POA: Insufficient documentation

## 2017-04-13 DIAGNOSIS — R0602 Shortness of breath: Secondary | ICD-10-CM | POA: Diagnosis present

## 2017-04-13 DIAGNOSIS — Z79899 Other long term (current) drug therapy: Secondary | ICD-10-CM | POA: Insufficient documentation

## 2017-04-13 DIAGNOSIS — E109 Type 1 diabetes mellitus without complications: Secondary | ICD-10-CM

## 2017-04-13 DIAGNOSIS — E1065 Type 1 diabetes mellitus with hyperglycemia: Secondary | ICD-10-CM | POA: Diagnosis not present

## 2017-04-13 DIAGNOSIS — R739 Hyperglycemia, unspecified: Secondary | ICD-10-CM

## 2017-04-13 DIAGNOSIS — I10 Essential (primary) hypertension: Secondary | ICD-10-CM | POA: Diagnosis not present

## 2017-04-13 DIAGNOSIS — J4521 Mild intermittent asthma with (acute) exacerbation: Secondary | ICD-10-CM | POA: Diagnosis not present

## 2017-04-13 LAB — BASIC METABOLIC PANEL
Anion gap: 9 (ref 5–15)
BUN: 9 mg/dL (ref 6–20)
CALCIUM: 9.2 mg/dL (ref 8.9–10.3)
CO2: 26 mmol/L (ref 22–32)
Chloride: 98 mmol/L — ABNORMAL LOW (ref 101–111)
Creatinine, Ser: 1.14 mg/dL (ref 0.61–1.24)
GFR calc Af Amer: >60 mL/min (ref >60)
Glucose, Bld: 310 mg/dL — ABNORMAL HIGH (ref 65–99)
POTASSIUM: 3.9 mmol/L (ref 3.5–5.1)
Sodium: 133 mmol/L — ABNORMAL LOW (ref 135–145)

## 2017-04-13 LAB — CBC WITH DIFFERENTIAL/PLATELET
Basophils Absolute: 0 10*3/uL (ref 0.0–0.1)
Basophils Relative: 0 %
Eosinophils Absolute: 0.4 10*3/uL (ref 0.0–0.7)
Eosinophils Relative: 4 %
HCT: 45 % (ref 39.0–52.0)
HEMOGLOBIN: 14.6 g/dL (ref 13.0–17.0)
LYMPHS ABS: 3.3 10*3/uL (ref 0.7–4.0)
LYMPHS PCT: 31 %
MCH: 28 pg (ref 26.0–34.0)
MCHC: 32.4 g/dL (ref 30.0–36.0)
MCV: 86.2 fL (ref 78.0–100.0)
MONOS PCT: 7 %
Monocytes Absolute: 0.8 10*3/uL (ref 0.1–1.0)
NEUTROS PCT: 58 %
Neutro Abs: 6.1 10*3/uL (ref 1.7–7.7)
Platelets: 233 10*3/uL (ref 150–400)
RBC: 5.22 MIL/uL (ref 4.22–5.81)
RDW: 14.7 % (ref 11.5–15.5)
WBC: 10.6 10*3/uL — AB (ref 4.0–10.5)

## 2017-04-13 MED ORDER — INSULIN ASPART 100 UNIT/ML ~~LOC~~ SOLN
12.0000 [IU] | Freq: Once | SUBCUTANEOUS | Status: AC
  Administered 2017-04-13: 12 [IU] via SUBCUTANEOUS
  Filled 2017-04-13: qty 1

## 2017-04-13 MED ORDER — IPRATROPIUM-ALBUTEROL 0.5-2.5 (3) MG/3ML IN SOLN
3.0000 mL | Freq: Once | RESPIRATORY_TRACT | Status: AC
  Administered 2017-04-13: 3 mL via RESPIRATORY_TRACT
  Filled 2017-04-13: qty 3

## 2017-04-13 MED ORDER — INSULIN LISPRO 100 UNIT/ML (KWIKPEN): 0.0000 [IU] | PEN_INJECTOR | Freq: Three times a day (TID) | SUBCUTANEOUS | 0 refills | Status: DC

## 2017-04-13 MED ORDER — PREDNISONE 20 MG PO TABS: 40.0000 mg | ORAL_TABLET | Freq: Every day | ORAL | 0 refills | Status: DC

## 2017-04-13 MED ORDER — ALBUTEROL SULFATE HFA 108 (90 BASE) MCG/ACT IN AERS: 2.0000 | INHALATION_SPRAY | RESPIRATORY_TRACT | 2 refills | Status: DC | PRN

## 2017-04-13 MED ORDER — ALBUTEROL SULFATE (2.5 MG/3ML) 0.083% IN NEBU
2.5000 mg | INHALATION_SOLUTION | Freq: Once | RESPIRATORY_TRACT | Status: AC
  Administered 2017-04-13: 2.5 mg via RESPIRATORY_TRACT
  Filled 2017-04-13: qty 3

## 2017-04-13 MED ORDER — ALBUTEROL SULFATE HFA 108 (90 BASE) MCG/ACT IN AERS
2.0000 | INHALATION_SPRAY | RESPIRATORY_TRACT | Status: DC | PRN
  Administered 2017-04-13: 2 via RESPIRATORY_TRACT
  Filled 2017-04-13: qty 6.7

## 2017-04-13 NOTE — ED Triage Notes (Signed)
Patient reports worsening SOB with chest congestion , wheezing and productive cough onset last week unrelieved by metered dose inhaler.

## 2017-04-13 NOTE — ED Provider Notes (Addendum)
[facility] DEPT Provider Note   CSN: [id number] Arrival date & time: 04/13/17  4268     History   Chief Complaint Chief Complaint  Patient presents with  . Shortness of Breath  . Cough  . Wheezing    HPI Christopher Douglas is a 26 y.o. male.  Patient presents with complaints of cough and shortness of breath. Symptoms have been ongoing for several days. He has a history of asthma. He has been using his albuterol inhaler with only partial relief. He reports that his cough has been productive of thick mucus. He is unaware of any fevers.      Past Medical History:  Diagnosis Date  . Asthma   . Diabetes mellitus without complication ([place])   . Hypertension     Patient Active Problem List   Diagnosis Date Noted  . Hyperlipidemia 04/06/2016  . Hypertension 06/29/2015  . Diabetic neuropathy ([place]) 09/16/2014  . DM type 1 (diabetes mellitus, type 1) ([place]) 01/04/2013  . Unspecified constipation 01/04/2013  . Asthma 01/03/2013    Past Surgical History:  Procedure Laterality Date  . FOOT SURGERY         Home Medications    Prior to Admission medications   Medication Sig Start Date End Date Taking? Authorizing Provider  albuterol (PROVENTIL HFA;VENTOLIN HFA) 108 (90 Base) MCG/ACT inhaler Inhale 2 puffs into the lungs every 4 (four) hours as needed for wheezing or shortness of breath. 03/25/16  Yes Waynetta Pean, PA-C  atorvastatin (LIPITOR) 10 MG tablet Take 1 tablet (10 mg total) by mouth daily. 04/06/16  Yes Arnoldo Morale, MD  Blood Glucose Monitoring Suppl (CONTOUR NEXT ONE) KIT 1 each by Does not apply route 3 (three) times daily before meals. 04/06/16  Yes Arnoldo Morale, MD  gabapentin (NEURONTIN) 300 MG capsule Take 1 capsule (300 mg total) by mouth 2 (two) times daily. Patient taking differently: Take 300 mg by mouth 2 (two) times daily as needed (pain).  04/06/16  Yes Arnoldo Morale, MD  glucose blood (ONETOUCH VERIO) test strip 1 each by Other route 2 (two) times  daily. And lancets 2/day 09/12/14  Yes Hoyt Koch, MD  ibuprofen (ADVIL,MOTRIN) 800 MG tablet Take 1 tablet (800 mg total) by mouth every 8 (eight) hours as needed. Patient taking differently: Take 800 mg by mouth every 8 (eight) hours as needed for moderate pain.  06/10/16  Yes Lawyer, Harrell Gave, PA-C  Insulin Glargine (LANTUS SOLOSTAR) 100 UNIT/ML Solostar Pen Inject 40 Units into the skin daily at 10 pm. 04/06/16  Yes Amao, Charlane Ferretti, MD  insulin lispro (HUMALOG KWIKPEN) 100 UNIT/ML KiwkPen Inject 0-0.12 mLs (0-12 Units total) into the skin 3 (three) times daily. 02/20/17  Yes Arnoldo Morale, MD  lisinopril (PRINIVIL,ZESTRIL) 10 MG tablet Take 1 tablet (10 mg total) by mouth daily. 04/06/16  Yes Arnoldo Morale, MD    Family History Family History  Problem Relation Age of Onset  . Diabetes Mellitus II Father   . Hypertension Father   . Alcohol abuse Sister   . Alcohol abuse Brother   . Diabetes Maternal Grandmother   . Diabetes Maternal Grandfather   . Hypertension Maternal Grandfather   . Hypertension Mother     Social History Social History  Substance Use Topics  . Smoking status: Current Every Day Smoker    Packs/day: 0.25    Years: 5.00    Types: Cigarettes  . Smokeless tobacco: Never Used  . Alcohol use No     Allergies   Amoxicillin  and Shellfish allergy   Review of Systems Review of Systems  Respiratory: Positive for cough, shortness of breath and wheezing.   All other systems reviewed and are negative.    Physical Exam Updated Vital Signs BP 137/78 (BP Location: Right Arm)   Pulse 84   Temp 98.8 F (37.1 C) (Oral)   Resp 12   SpO2 98%   Physical Exam  Constitutional: He is oriented to person, place, and time. He appears well-developed and well-nourished. No distress.  HENT:  Head: Normocephalic and atraumatic.  Right Ear: Hearing normal.  Left Ear: Hearing normal.  Nose: Nose normal.  Mouth/Throat: Oropharynx is clear and moist and mucous  membranes are normal.  Eyes: Conjunctivae and EOM are normal. Pupils are equal, round, and reactive to light.  Neck: Normal range of motion. Neck supple.  Cardiovascular: Regular rhythm, S1 normal and S2 normal.  Exam reveals no gallop and no friction rub.   No murmur heard. Pulmonary/Chest: Effort normal. No respiratory distress. He has wheezes. He exhibits no tenderness.  Abdominal: Soft. Normal appearance and bowel sounds are normal. There is no hepatosplenomegaly. There is no tenderness. There is no rebound, no guarding, no tenderness at McBurney's point and negative Murphy's sign. No hernia.  Musculoskeletal: Normal range of motion.  Neurological: He is alert and oriented to person, place, and time. He has normal strength. No cranial nerve deficit or sensory deficit. Coordination normal. GCS eye subscore is 4. GCS verbal subscore is 5. GCS motor subscore is 6.  Skin: Skin is warm, dry and intact. No rash noted. No cyanosis.  Psychiatric: He has a normal mood and affect. His speech is normal and behavior is normal. Thought content normal.  Nursing note and vitals reviewed.    ED Treatments / Results  Labs (all labs ordered are listed, but only abnormal results are displayed) Labs Reviewed  CBC WITH DIFFERENTIAL/PLATELET - Abnormal; Notable for the following:       Result Value   WBC 10.6 (*)    All other components within normal limits  BASIC METABOLIC PANEL - Abnormal; Notable for the following:    Sodium 133 (*)    Chloride 98 (*)    Glucose, Bld 310 (*)    All other components within normal limits    EKG  EKG Interpretation  Date/Time:  Thursday Apr 13 2017 05:27:49 EDT Ventricular Rate:  85 PR Interval:    QRS Duration: 89 QT Interval:  371 QTC Calculation: 442 R Axis:   84 Text Interpretation:  Sinus rhythm Normal ECG Confirmed by Orpah Greek ([id number]) on 04/13/2017 5:51:46 AM       Radiology Dg Chest Port 1 View  Result Date: 04/13/2017 CLINICAL DATA:   Cough, shortness of breath, and asthma tonight. EXAM: PORTABLE CHEST 1 VIEW COMPARISON:  03/25/2016 FINDINGS: Hyperinflation of the lungs compatible with history of asthma. Normal heart size and pulmonary vascularity. No focal airspace disease or consolidation in the lungs. No blunting of costophrenic angles. No pneumothorax. Mediastinal contours appear intact. IMPRESSION: Pulmonary hyperinflation.  No consolidation or edema. Electronically Signed   By: Lucienne Capers M.D.   On: 04/13/2017 05:47    Procedures Procedures (including critical care time)  Medications Ordered in ED Medications  insulin aspart (novoLOG) injection 12 Units (not administered)  ipratropium-albuterol (DUONEB) 0.5-2.5 (3) MG/3ML nebulizer solution 3 mL (3 mLs Nebulization Given 04/13/17 0531)  albuterol (PROVENTIL) (2.5 MG/3ML) 0.083% nebulizer solution 2.5 mg (2.5 mg Nebulization Given 04/13/17 0531)  Initial Impression / Assessment and Plan / ED Course  I have reviewed the triage vital signs and the nursing notes.  Pertinent labs & imaging results that were available during my care of the patient were reviewed by me and considered in my medical decision making (see chart for details).     Patient presents with complaints of shortness of breath. Patient reports that he has been experiencing cough, chest congestion and increased wheezing for several days. He has had only partial relief with his albuterol inhaler. He is not in any distress at arrival. Oxygenation is 98% on room air, however, he does have wheezing on examination. Chest x-ray does not show evidence of pneumonia. Patient treated with bronchodilator therapy with improvement. Continue to treat for asthma exacerbation as an outpatient.  Blood sugar was elevated. Patient takes long-acting insulin at night and then has a sliding scale for daytime use. Patient missed her insulin here in the ER. He will be given a prescription for prednisone 40 mg daily for 5  days. He was instructed to monitor his glucose closely and only take the medicine if he gets better control with sliding scale. Otherwise he will use albuterol every 2-4 hours for his asthma.  Final Clinical Impressions(s) / ED Diagnoses   Final diagnoses:  Mild intermittent asthma with exacerbation  Hyperglycemia    New Prescriptions New Prescriptions   No medications on file     Orpah Greek, MD 04/13/17 [id number]    Orpah Greek, MD 04/13/17 [phone]

## 2017-05-03 ENCOUNTER — Other Ambulatory Visit: Payer: Self-pay | Admitting: Family Medicine

## 2017-05-03 DIAGNOSIS — E109 Type 1 diabetes mellitus without complications: Secondary | ICD-10-CM

## 2017-05-12 ENCOUNTER — Ambulatory Visit: Payer: Managed Care, Other (non HMO) | Attending: Family Medicine | Admitting: Family Medicine

## 2017-05-12 ENCOUNTER — Encounter: Payer: Self-pay | Admitting: Family Medicine

## 2017-05-12 VITALS — BP 138/81 | HR 71 | Temp 97.8°F | Wt 263.4 lb

## 2017-05-12 DIAGNOSIS — E1049 Type 1 diabetes mellitus with other diabetic neurological complication: Secondary | ICD-10-CM | POA: Diagnosis not present

## 2017-05-12 DIAGNOSIS — Z880 Allergy status to penicillin: Secondary | ICD-10-CM | POA: Insufficient documentation

## 2017-05-12 DIAGNOSIS — E7800 Pure hypercholesterolemia, unspecified: Secondary | ICD-10-CM | POA: Insufficient documentation

## 2017-05-12 DIAGNOSIS — K219 Gastro-esophageal reflux disease without esophagitis: Secondary | ICD-10-CM | POA: Insufficient documentation

## 2017-05-12 DIAGNOSIS — E1065 Type 1 diabetes mellitus with hyperglycemia: Secondary | ICD-10-CM | POA: Insufficient documentation

## 2017-05-12 DIAGNOSIS — E109 Type 1 diabetes mellitus without complications: Secondary | ICD-10-CM

## 2017-05-12 DIAGNOSIS — Z794 Long term (current) use of insulin: Secondary | ICD-10-CM | POA: Diagnosis not present

## 2017-05-12 DIAGNOSIS — Z1159 Encounter for screening for other viral diseases: Secondary | ICD-10-CM | POA: Diagnosis not present

## 2017-05-12 DIAGNOSIS — R1084 Generalized abdominal pain: Secondary | ICD-10-CM

## 2017-05-12 DIAGNOSIS — I10 Essential (primary) hypertension: Secondary | ICD-10-CM | POA: Diagnosis not present

## 2017-05-12 DIAGNOSIS — J45909 Unspecified asthma, uncomplicated: Secondary | ICD-10-CM | POA: Insufficient documentation

## 2017-05-12 DIAGNOSIS — Z23 Encounter for immunization: Secondary | ICD-10-CM

## 2017-05-12 LAB — POCT GLYCOSYLATED HEMOGLOBIN (HGB A1C): HEMOGLOBIN A1C: 11.8

## 2017-05-12 LAB — GLUCOSE, POCT (MANUAL RESULT ENTRY): POC Glucose: 234 mg/dl — AB (ref 70–99)

## 2017-05-12 MED ORDER — INSULIN GLARGINE 100 UNIT/ML SOLOSTAR PEN: 50.0000 [IU] | PEN_INJECTOR | Freq: Every day | SUBCUTANEOUS | 3 refills | Status: DC

## 2017-05-12 MED ORDER — INSULIN LISPRO 100 UNIT/ML (KWIKPEN): 0.0000 [IU] | PEN_INJECTOR | Freq: Three times a day (TID) | SUBCUTANEOUS | 0 refills | Status: DC

## 2017-05-12 MED ORDER — ATORVASTATIN CALCIUM 10 MG PO TABS: 10.0000 mg | ORAL_TABLET | Freq: Every day | ORAL | 3 refills | Status: DC

## 2017-05-12 MED ORDER — OMEPRAZOLE 20 MG PO CPDR: 20.0000 mg | DELAYED_RELEASE_CAPSULE | Freq: Every day | ORAL | 3 refills | Status: DC

## 2017-05-12 MED ORDER — GABAPENTIN 300 MG PO CAPS: 300.0000 mg | ORAL_CAPSULE | Freq: Two times a day (BID) | ORAL | 3 refills | Status: DC

## 2017-05-12 MED ORDER — LISINOPRIL 10 MG PO TABS: 10.0000 mg | ORAL_TABLET | Freq: Every day | ORAL | 3 refills | Status: DC

## 2017-05-12 MED ORDER — INSULIN PEN NEEDLE 31G X 5 MM MISC: 1.0000 | Freq: Three times a day (TID) | 5 refills | Status: DC

## 2017-05-12 MED ORDER — INSULIN LISPRO 100 UNIT/ML (KWIKPEN)
0.0000 [IU] | PEN_INJECTOR | Freq: Three times a day (TID) | SUBCUTANEOUS | 3 refills | Status: DC
Start: 2017-05-12 — End: 2017-10-06

## 2017-05-12 NOTE — Patient Instructions (Signed)
Food Choices for Gastroesophageal Reflux Disease, Adult When you have gastroesophageal reflux disease (GERD), the foods you eat and your eating habits are very important. Choosing the right foods can help ease your discomfort. What guidelines do I need to follow?  Choose fruits, vegetables, whole grains, and low-fat dairy products.  Choose low-fat meat, fish, and poultry.  Limit fats such as oils, salad dressings, butter, nuts, and avocado.  Keep a food diary. This helps you identify foods that cause symptoms.  Avoid foods that cause symptoms. These may be different for everyone.  Eat small meals often instead of 3 large meals a day.  Eat your meals slowly, in a place where you are relaxed.  Limit fried foods.  Cook foods using methods other than frying.  Avoid drinking alcohol.  Avoid drinking large amounts of liquids with your meals.  Avoid bending over or lying down until 2-3 hours after eating. What foods are not recommended? These are some foods and drinks that may make your symptoms worse: Vegetables  Tomatoes. Tomato juice. Tomato and spaghetti sauce. Chili peppers. Onion and garlic. Horseradish. Fruits  Oranges, grapefruit, and lemon (fruit and juice). Meats  High-fat meats, fish, and poultry. This includes hot dogs, ribs, ham, sausage, salami, and bacon. Dairy  Whole milk and chocolate milk. Sour cream. Cream. Butter. Ice cream. Cream cheese. Drinks  Coffee and tea. Bubbly (carbonated) drinks or energy drinks. Condiments  Hot sauce. Barbecue sauce. Sweets/Desserts  Chocolate and cocoa. Donuts. Peppermint and spearmint. Fats and Oils  High-fat foods. This includes French fries and potato chips. Other  Vinegar. Strong spices. This includes black pepper, white pepper, red pepper, cayenne, curry powder, cloves, ginger, and chili powder. The items listed above may not be a complete list of foods and drinks to avoid. Contact your dietitian for more information.    This information is not intended to replace advice given to you by your health care provider. Make sure you discuss any questions you have with your health care provider. Document Released: 05/08/2012 Document Revised: 04/14/2016 Document Reviewed: 09/11/2013 Elsevier Interactive Patient Education  2017 [employer] Inc[employer]  

## 2017-05-12 NOTE — Progress Notes (Signed)
Subjective:  Patient ID: Christopher Douglas, male    DOB: 1991-04-06  Age: 26 y.o. MRN: [id number]  CC: Diabetes   HPI Christopher Douglas is a 26 year old male with history of type 1 diabetes mellitus (A1c 11.8), hypertension, hyperlipidemia who comes to the clinic for follow-up visit.  He has been compliant with 40 units of Lantus up until one week ago when he ran out and also takes Humalog sliding scale. Fasting sugars have been 200s and he endorses noncompliance with a diabetic diet. He denies visual complaints, numbness in extremities.  He tolerates his antihypertensive and statin with no complaints of adverse effects.  He complains of abdominal bloating and also reflux (which wakes him up at night) especially when in the supine position. He has been taking OTC Gas-X for the bloating. Denies nausea, vomiting or diarrhea.  Past Medical History:  Diagnosis Date  . Asthma   . Diabetes mellitus without complication ([place])   . Hypertension     Past Surgical History:  Procedure Laterality Date  . FOOT SURGERY      Allergies  Allergen Reactions  . Amoxicillin Hives    Breaks out in hives  . Shellfish Allergy Hives    Specifically shrimp unknown     Outpatient Medications Prior to Visit  Medication Sig Dispense Refill  . albuterol (PROVENTIL HFA;VENTOLIN HFA) 108 (90 Base) MCG/ACT inhaler Inhale 2 puffs into the lungs every 4 (four) hours as needed for wheezing or shortness of breath. 1 Inhaler 2  . Blood Glucose Monitoring Suppl (CONTOUR NEXT ONE) KIT 1 each by Does not apply route 3 (three) times daily before meals. 1 kit 12  . glucose blood (ONETOUCH VERIO) test strip 1 each by Other route 2 (two) times daily. And lancets 2/day 100 each 12  . ibuprofen (ADVIL,MOTRIN) 800 MG tablet Take 1 tablet (800 mg total) by mouth every 8 (eight) hours as needed. (Patient taking differently: Take 800 mg by mouth every 8 (eight) hours as needed for moderate pain. ) 21 tablet 0  . albuterol  (PROVENTIL HFA;VENTOLIN HFA) 108 (90 Base) MCG/ACT inhaler Inhale 2 puffs into the lungs every 4 (four) hours as needed for wheezing or shortness of breath. 1 Inhaler 1  . atorvastatin (LIPITOR) 10 MG tablet Take 1 tablet (10 mg total) by mouth daily. 30 tablet 3  . gabapentin (NEURONTIN) 300 MG capsule Take 1 capsule (300 mg total) by mouth 2 (two) times daily. (Patient taking differently: Take 300 mg by mouth 2 (two) times daily as needed (pain). ) 60 capsule 3  . Insulin Glargine (LANTUS SOLOSTAR) 100 UNIT/ML Solostar Pen Inject 40 Units into the skin daily at 10 pm. 5 pen 3  . insulin lispro (HUMALOG KWIKPEN) 100 UNIT/ML KiwkPen Inject 0-0.12 mLs (0-12 Units total) into the skin 3 (three) times daily. 15 mL 0  . lisinopril (PRINIVIL,ZESTRIL) 10 MG tablet Take 1 tablet (10 mg total) by mouth daily. 30 tablet 3  . predniSONE (DELTASONE) 20 MG tablet Take 2 tablets (40 mg total) by mouth daily with breakfast. 10 tablet 0   No facility-administered medications prior to visit.     ROS Review of Systems  Constitutional: Negative for activity change and appetite change.  HENT: Negative for sinus pressure and sore throat.   Eyes: Negative for visual disturbance.  Respiratory: Negative for cough, chest tightness and shortness of breath.   Cardiovascular: Negative for chest pain and leg swelling.  Gastrointestinal: Negative for abdominal distention, abdominal pain, constipation and  diarrhea.       Abdominal bloating  Endocrine: Negative.   Genitourinary: Negative for dysuria.  Musculoskeletal: Negative for joint swelling and myalgias.  Skin: Negative for rash.  Allergic/Immunologic: Negative.   Neurological: Negative for weakness, light-headedness and numbness.  Psychiatric/Behavioral: Negative for dysphoric mood and suicidal ideas.    Objective:  BP 138/81   Pulse 71   Temp 97.8 F (36.6 C) (Oral)   Wt 263 lb 6.4 oz (119.5 kg)   SpO2 99%   BMI 32.92 kg/m   BP/Weight 05/12/2017  04/13/2017 8/33/8250  Systolic BP 539 767 341  Diastolic BP 81 81 82  Wt. (Lbs) 263.4 - 269  BMI 32.92 - 33.62      Physical Exam  Constitutional: He is oriented to person, place, and time. He appears well-developed and well-nourished.  HENT:  Right Ear: External ear normal.  Left Ear: External ear normal.  Mouth/Throat: Oropharynx is clear and moist.  Cardiovascular: Normal rate, normal heart sounds and intact distal pulses.   No murmur heard. Pulmonary/Chest: Effort normal and breath sounds normal. He has no wheezes. He has no rales. He exhibits no tenderness.  Abdominal: Soft. Bowel sounds are normal. He exhibits no distension and no mass. There is no tenderness.  Musculoskeletal: Normal range of motion.  Neurological: He is alert and oriented to person, place, and time.  Skin: Skin is warm and dry.  Psychiatric: He has a normal mood and affect.     CMP Latest Ref Rng & Units 04/13/2017 03/25/2016 07/24/2015  Glucose 65 - 99 mg/dL 310(H) 243(H) 181(H)  BUN 6 - 20 mg/dL _0 Creatinine 0.61 - 1.24 mg/dL 1.14 0.98 0.95  Sodium 135 - 145 mmol/L 133(L) 136 139  Potassium 3.5 - 5.1 mmol/L 3.9 3.9 3.8  Chloride 101 - 111 mmol/L 98(L) 100(L) 104  CO2 22 - 32 mmol/L _1 Calcium 8.9 - 10.3 mg/dL 9.2 9.3 8.9  Total Protein 6.0 - 8.3 g/dL - - -  Total Bilirubin 0.2 - 1.2 mg/dL - - -  Alkaline Phos 39 - 117 U/L - - -  AST 0 - 37 U/L - - -  ALT 0 - 53 U/L - - -    Lab Results  Component Value Date   HGBA1C 11.8 05/12/2017    Assessment & Plan:   1. Type 1 diabetes mellitus without complication (HCC) Uncontrolled with A1c of 11.8 Increased dose of Lantus from 40 units to 50 units Continue Humalog Diabetic diet Referred to ophthalmology for annual eye exam - POCT glucose (manual entry) - POCT glycosylated hemoglobin (Hb A1C) - Insulin Glargine (LANTUS SOLOSTAR) 100 UNIT/ML Solostar Pen; Inject 50 Units into the skin daily at 10 pm.  Dispense: 5 pen; Refill: 3  2. Pure  hypercholesterolemia Low cholesterol diet - Lipid panel; Future - atorvastatin (LIPITOR) 10 MG tablet; Take 1 tablet (10 mg total) by mouth daily.  Dispense: 30 tablet; Refill: 3  3. Essential hypertension Controlled - lisinopril (PRINIVIL,ZESTRIL) 10 MG tablet; Take 1 tablet (10 mg total) by mouth daily.  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 As per sliding scale  Dispense: 15 mL; Refill: 0 - gabapentin (NEURONTIN) 300 MG capsule; Take 1 capsule (300 mg total) by mouth 2 (two) times daily.  Dispense: 60 capsule; Refill: 3 - Insulin Pen Needle 31G X 5 MM MISC; 1 each by Does not apply route 4 (four) times daily -  with meals and at bedtime.  Dispense: 120 each; Refill: 5  5. Gastroesophageal reflux disease without esophagitis Advised to avoid late meals We'll need to exclude H. pylori gastritis Commence Prilosec - omeprazole (PRILOSEC) 20 MG capsule; Take 1 capsule (20 mg total) by mouth daily.  Dispense: 30 capsule; Refill: 3 - H. pylori breath test  6. Screening for viral disease - HIV antibody (with reflex); Future  7. Need for pneumococcal vaccination  8. Generalized abdominal pain - H. pylori breath test   Meds ordered this encounter  Medications  . omeprazole (PRILOSEC) 20 MG capsule    Sig: Take 1 capsule (20 mg total) by mouth daily.    Dispense:  30 capsule    Refill:  3  . insulin lispro (HUMALOG KWIKPEN) 100 UNIT/ML KiwkPen    Sig: Inject 0-0.12 mLs (0-12 Units total) into the skin 3 (three) times daily. As per sliding scale    Dispense:  15 mL    Refill:  0  . atorvastatin (LIPITOR) 10 MG tablet    Sig: Take 1 tablet (10 mg total) by mouth daily.    Dispense:  30 tablet    Refill:  3  . lisinopril (PRINIVIL,ZESTRIL)  10 MG tablet    Sig: Take 1 tablet (10 mg total) by mouth daily.    Dispense:  30 tablet    Refill:  3  . gabapentin (NEURONTIN) 300 MG capsule    Sig: Take 1 capsule (300 mg total) by mouth 2 (two) times daily.    Dispense:  60 capsule    Refill:  3  . Insulin Glargine (LANTUS SOLOSTAR) 100 UNIT/ML Solostar Pen    Sig: Inject 50 Units into the skin daily at 10 pm.    Dispense:  5 pen    Refill:  3    Discontinue previous dose  . Insulin Pen Needle 31G X 5 MM MISC    Sig: 1 each by Does not apply route 4 (four) times daily -  with meals and at bedtime.    Dispense:  120 each    Refill:  5    Follow-up: Return in about 3 months (around 08/12/2017) for Follow-up on diabetes mellitus.   Arnoldo Morale MD

## 2017-05-14 LAB — H. PYLORI BREATH TEST: H pylori Breath Test: NEGATIVE

## 2017-05-15 ENCOUNTER — Other Ambulatory Visit: Payer: Managed Care, Other (non HMO)

## 2017-05-15 ENCOUNTER — Encounter: Payer: Self-pay | Admitting: Family Medicine

## 2017-05-17 ENCOUNTER — Telehealth: Payer: Self-pay

## 2017-05-17 NOTE — Telephone Encounter (Signed)
Pt was called and informed of lab results. 

## 2017-07-03 ENCOUNTER — Emergency Department (HOSPITAL_COMMUNITY)
Admission: EM | Admit: 2017-07-03 | Discharge: 2017-07-03 | Disposition: A | Discharge status: Home / Self Care | Payer: Managed Care, Other (non HMO) | Attending: Emergency Medicine | Admitting: Emergency Medicine

## 2017-07-03 ENCOUNTER — Emergency Department (HOSPITAL_COMMUNITY): Payer: Managed Care, Other (non HMO)

## 2017-07-03 ENCOUNTER — Encounter (HOSPITAL_COMMUNITY): Payer: Self-pay

## 2017-07-03 DIAGNOSIS — J45909 Unspecified asthma, uncomplicated: Secondary | ICD-10-CM | POA: Diagnosis not present

## 2017-07-03 DIAGNOSIS — E1065 Type 1 diabetes mellitus with hyperglycemia: Secondary | ICD-10-CM | POA: Insufficient documentation

## 2017-07-03 DIAGNOSIS — I10 Essential (primary) hypertension: Secondary | ICD-10-CM | POA: Insufficient documentation

## 2017-07-03 DIAGNOSIS — N179 Acute kidney failure, unspecified: Secondary | ICD-10-CM | POA: Insufficient documentation

## 2017-07-03 DIAGNOSIS — R0981 Nasal congestion: Secondary | ICD-10-CM | POA: Diagnosis present

## 2017-07-03 DIAGNOSIS — F17210 Nicotine dependence, cigarettes, uncomplicated: Secondary | ICD-10-CM | POA: Diagnosis not present

## 2017-07-03 DIAGNOSIS — Z794 Long term (current) use of insulin: Secondary | ICD-10-CM | POA: Diagnosis not present

## 2017-07-03 DIAGNOSIS — Z79899 Other long term (current) drug therapy: Secondary | ICD-10-CM | POA: Diagnosis not present

## 2017-07-03 DIAGNOSIS — R739 Hyperglycemia, unspecified: Secondary | ICD-10-CM

## 2017-07-03 DIAGNOSIS — J069 Acute upper respiratory infection, unspecified: Secondary | ICD-10-CM | POA: Insufficient documentation

## 2017-07-03 LAB — URINALYSIS, ROUTINE W REFLEX MICROSCOPIC
BILIRUBIN URINE: NEGATIVE
Glucose, UA: >=500 mg/dL — AB
HGB URINE DIPSTICK: NEGATIVE
Ketones, ur: 20 mg/dL — AB
Leukocytes, UA: NEGATIVE
NITRITE: NEGATIVE
PH: 6 (ref 5.0–8.0)
Protein, ur: NEGATIVE mg/dL
SPECIFIC GRAVITY, URINE: 1.035 — AB (ref 1.005–1.030)
Squamous Epithelial / LPF: NONE SEEN
WBC, UA: NONE SEEN WBC/hpf (ref 0–5)

## 2017-07-03 LAB — BASIC METABOLIC PANEL
Anion gap: 12 (ref 5–15)
BUN: 8 mg/dL (ref 6–20)
CALCIUM: 9.3 mg/dL (ref 8.9–10.3)
CO2: 22 mmol/L (ref 22–32)
CREATININE: 1.28 mg/dL — AB (ref 0.61–1.24)
Chloride: 96 mmol/L — ABNORMAL LOW (ref 101–111)
GFR calc Af Amer: >60 mL/min (ref >60)
Glucose, Bld: 589 mg/dL (ref 65–99)
Potassium: 4.4 mmol/L (ref 3.5–5.1)
SODIUM: 130 mmol/L — AB (ref 135–145)

## 2017-07-03 LAB — CBG MONITORING, ED
GLUCOSE-CAPILLARY: 561 mg/dL — AB (ref 65–99)
Glucose-Capillary: 405 mg/dL — ABNORMAL HIGH (ref 65–99)
Glucose-Capillary: 268 mg/dL — ABNORMAL HIGH (ref 65–99)

## 2017-07-03 LAB — CBC
HCT: 42.8 % (ref 39.0–52.0)
Hemoglobin: 14.8 g/dL (ref 13.0–17.0)
MCH: 28.5 pg (ref 26.0–34.0)
MCHC: 34.6 g/dL (ref 30.0–36.0)
MCV: 82.3 fL (ref 78.0–100.0)
PLATELETS: 182 10*3/uL (ref 150–400)
RBC: 5.2 MIL/uL (ref 4.22–5.81)
RDW: 13.7 % (ref 11.5–15.5)
WBC: 6.3 10*3/uL (ref 4.0–10.5)

## 2017-07-03 MED ORDER — SODIUM CHLORIDE 0.9 % IV BOLUS (SEPSIS)
2000.0000 mL | Freq: Once | INTRAVENOUS | Status: AC
  Administered 2017-07-03: 2000 mL via INTRAVENOUS

## 2017-07-03 MED ORDER — INSULIN ASPART 100 UNIT/ML ~~LOC~~ SOLN
10.0000 [IU] | Freq: Once | SUBCUTANEOUS | Status: AC
  Administered 2017-07-03: 10 [IU] via INTRAVENOUS
  Filled 2017-07-03: qty 1

## 2017-07-03 NOTE — ED Notes (Signed)
Patient transported to X-ray 

## 2017-07-03 NOTE — ED Notes (Signed)
CBG rechecked 405

## 2017-07-03 NOTE — ED Triage Notes (Addendum)
PT reports nasal congestion, headache, chills, and body aches since Saturday with little improvement using otc meds. PT states he is a type 1 diabetic and hasn't checked his sugar today but has been feeling weak

## 2017-07-03 NOTE — ED Notes (Signed)
Notified Dr. Criss AlvineGoldston of blood glucose of 589 and no new orders.  Working to get patient back

## 2017-07-03 NOTE — Discharge Instructions (Signed)
Thank you for allowing us to care for you  Your respiratory symptoms are most like due to a viral illness. Continue with over the counter medications.  Your feelings of tiredness and increased urination were due to elevate blood sugar. Please check your blood sugar at night and before each meal and follow your insulin sliding scale.  Please contact a medical professional if you experience severe symptoms.

## 2017-07-03 NOTE — ED Provider Notes (Signed)
[facility] DEPT Provider Note   CSN: [id number] Arrival date & time: 07/03/17  1430     History   Chief Complaint Chief Complaint  Patient presents with  . Nasal Congestion  . Weakness  . Hyperglycemia    HPI Christopher Douglas is a 26 y.o. male.  Patient with a history of asthma, diabetes, HTN and GERD presenting with 2 days of congestion, cough and weakness. He states that he began experiencing congestion and cough producing yellow-green mucus on Saturday with an episode of nausea and vomiting that day (no vomiting since) with an episode of chills. He did not have and measured fevers and is afebrile today. He has found relief with OTC allergy medication and robitussin. He also endorses feeling tired with increased urination for the past several days. He did take his Lantus last night, but has not taken his bolus Humalog today as he has not eaten. He is on sliding scale but does not consistently check his blood sugar before administering his bolus doses, typically gives himself 10 units with lunch. His blood sugar at home typically runs 170-220.      Past Medical History:  Diagnosis Date  . Asthma   . Diabetes mellitus without complication ([place])   . Hypertension     Patient Active Problem List   Diagnosis Date Noted  . GERD (gastroesophageal reflux disease) 05/12/2017  . Hyperlipidemia 04/06/2016  . Hypertension 06/29/2015  . Diabetic neuropathy ([place]) 09/16/2014  . DM type 1 (diabetes mellitus, type 1) ([place]) 01/04/2013  . Unspecified constipation 01/04/2013  . Asthma 01/03/2013    Past Surgical History:  Procedure Laterality Date  . FOOT SURGERY         Home Medications    Prior to Admission medications   Medication Sig Start Date End Date Taking? Authorizing Provider  albuterol (PROVENTIL HFA;VENTOLIN HFA) 108 (90 Base) MCG/ACT inhaler Inhale 2 puffs into the lungs every 4 (four) hours as needed for wheezing or shortness of breath. 04/13/17   Pollina,  Gwenyth Allegra, MD  atorvastatin (LIPITOR) 10 MG tablet Take 1 tablet (10 mg total) by mouth daily. 05/12/17   Arnoldo Morale, MD  Blood Glucose Monitoring Suppl (CONTOUR NEXT ONE) KIT 1 each by Does not apply route 3 (three) times daily before meals. 04/06/16   Arnoldo Morale, MD  gabapentin (NEURONTIN) 300 MG capsule Take 1 capsule (300 mg total) by mouth 2 (two) times daily. 05/12/17   Arnoldo Morale, MD  glucose blood (ONETOUCH VERIO) test strip 1 each by Other route 2 (two) times daily. And lancets 2/day 09/12/14   Hoyt Koch, MD  ibuprofen (ADVIL,MOTRIN) 800 MG tablet Take 1 tablet (800 mg total) by mouth every 8 (eight) hours as needed. Patient taking differently: Take 800 mg by mouth every 8 (eight) hours as needed for moderate pain.  06/10/16   Lawyer, Harrell Gave, PA-C  Insulin Glargine (LANTUS SOLOSTAR) 100 UNIT/ML Solostar Pen Inject 50 Units into the skin daily at 10 pm. 05/12/17   Arnoldo Morale, MD  insulin lispro (HUMALOG KWIKPEN) 100 UNIT/ML KiwkPen Inject 0-0.12 mLs (0-12 Units total) into the skin 3 (three) times daily. As per sliding scale 05/12/17   Arnoldo Morale, MD  Insulin Pen Needle 31G X 5 MM MISC 1 each by Does not apply route 4 (four) times daily -  with meals and at bedtime. 05/12/17   Arnoldo Morale, MD  lisinopril (PRINIVIL,ZESTRIL) 10 MG tablet Take 1 tablet (10 mg total) by mouth daily. 05/12/17   Arnoldo Morale,  MD  omeprazole (PRILOSEC) 20 MG capsule Take 1 capsule (20 mg total) by mouth daily. 05/12/17   Arnoldo Morale, MD    Family History Family History  Problem Relation Age of Onset  . Diabetes Mellitus II Father   . Hypertension Father   . Alcohol abuse Sister   . Alcohol abuse Brother   . Diabetes Maternal Grandmother   . Diabetes Maternal Grandfather   . Hypertension Maternal Grandfather   . Hypertension Mother     Social History Social History  Substance Use Topics  . Smoking status: Current Every Day Smoker    Packs/day: 0.25    Years: 5.00     Types: Cigarettes  . Smokeless tobacco: Never Used  . Alcohol use No     Allergies   Amoxicillin and Shellfish allergy   Review of Systems Review of Systems  Constitutional: Negative for chills and fever.  HENT: Negative for ear pain and sore throat.   Eyes: Negative for pain and visual disturbance.  Respiratory: Negative for cough and shortness of breath.   Cardiovascular: Negative for chest pain and palpitations.  Gastrointestinal: Negative for abdominal pain and vomiting.  Genitourinary: Positive for frequency. Negative for dysuria and hematuria.  Musculoskeletal: Negative for arthralgias and back pain.  Skin: Negative for color change and rash.  Neurological: Negative for seizures and syncope.  All other systems reviewed and are negative.    Physical Exam Updated Vital Signs BP (!) 145/98 (BP Location: Left Arm)   Pulse 93   Temp 98.5 F (36.9 C) (Oral)   Resp 18   Ht _0  (1.905 m)   Wt 122 kg (269 lb)   SpO2 98%   BMI 33.62 kg/m   Physical Exam  Constitutional: He appears well-developed and well-nourished.  HENT:  Head: Normocephalic and atraumatic.  Eyes: Conjunctivae are normal.  Neck: Neck supple.  Cardiovascular: Normal rate and regular rhythm.   No murmur heard. Pulmonary/Chest: Effort normal and breath sounds normal. No respiratory distress.  Mild rhonchi, improves with cough  Abdominal: Soft. There is no tenderness.  Musculoskeletal: He exhibits no edema.  Neurological: He is alert.  Skin: Skin is warm and dry.  Psychiatric: He has a normal mood and affect.  Nursing note and vitals reviewed.    ED Treatments / Results  Labs (all labs ordered are listed, but only abnormal results are displayed) Labs Reviewed  BASIC METABOLIC PANEL - Abnormal; Notable for the following:       Result Value   Sodium 130 (*)    Chloride 96 (*)    Glucose, Bld 589 (*)    Creatinine, Ser 1.28 (*)    All other components within normal limits  URINALYSIS,  ROUTINE W REFLEX MICROSCOPIC - Abnormal; Notable for the following:    Color, Urine STRAW (*)    Specific Gravity, Urine 1.035 (*)    Glucose, UA >=500 (*)    Ketones, ur 20 (*)    Bacteria, UA RARE (*)    All other components within normal limits  CBG MONITORING, ED - Abnormal; Notable for the following:    Glucose-Capillary 561 (*)    All other components within normal limits  CBG MONITORING, ED - Abnormal; Notable for the following:    Glucose-Capillary 405 (*)    All other components within normal limits  CBC    EKG  EKG Interpretation None       Radiology Dg Chest 2 View  Result Date: 07/03/2017 CLINICAL DATA:  Nasal congestion,  headache, chills and body aches. EXAM: CHEST  2 VIEW COMPARISON:  04/13/2017 FINDINGS: The heart size and mediastinal contours are within normal limits. There is no evidence of pulmonary edema, consolidation, pneumothorax, nodule or pleural fluid. The visualized skeletal structures are unremarkable. IMPRESSION: No active cardiopulmonary disease. Electronically Signed   By: Aletta Edouard M.D.   On: 07/03/2017 20:28    Procedures Procedures (including critical care time)  Medications Ordered in ED Medications  sodium chloride 0.9 % bolus 2,000 mL (2,000 mLs Intravenous New Bag/Given 07/03/17 1916)  insulin aspart (novoLOG) injection 10 Units (10 Units Intravenous Given 07/03/17 1956)     Initial Impression / Assessment and Plan / ED Course  I have reviewed the triage vital signs and the nursing notes.  Pertinent labs & imaging results that were available during my care of the patient were reviewed by me and considered in my medical decision making (see chart for details).    Patient with symptoms of likely viral respiratory illness and hyperglycemia in the setting of asthma and type 1 diabetes. - CBC: WNL - BMP: Glucose 589; Cr 1.28; CO2 22 (wnl) - U/A: Glucose > 500; Ketones 20 - Repeat CBG (after 2 hrs) - 405 - Chest Xray: No active  cardiopulmonary disease  Patient with hyperglycemia with ketones, but without acidosis. Will give 2 L IVF Bolus and 10 units of insulin.  - Patient feels well following fluid and insulin. Will be discharge with instruction to follow his home sliding scale insulin regimen.   Final Clinical Impressions(s) / ED Diagnoses   Final diagnoses:  Hyperglycemia  AKI (acute kidney injury) ([place])  Viral upper respiratory tract infection    New Prescriptions New Prescriptions   No medications on file     Neva Seat, MD 07/06/17 [place], [place], DO 07/07/17 [phone]

## 2017-07-03 NOTE — ED Notes (Signed)
Pt verbalizes understanding of d/c instructions.

## 2017-08-14 ENCOUNTER — Ambulatory Visit: Payer: Managed Care, Other (non HMO) | Admitting: Family Medicine

## 2017-10-06 ENCOUNTER — Telehealth: Payer: Self-pay | Admitting: Family Medicine

## 2017-10-06 DIAGNOSIS — E1049 Type 1 diabetes mellitus with other diabetic neurological complication: Secondary | ICD-10-CM

## 2017-10-06 DIAGNOSIS — E7800 Pure hypercholesterolemia, unspecified: Secondary | ICD-10-CM

## 2017-10-06 DIAGNOSIS — I10 Essential (primary) hypertension: Secondary | ICD-10-CM

## 2017-10-06 DIAGNOSIS — E109 Type 1 diabetes mellitus without complications: Secondary | ICD-10-CM

## 2017-10-06 DIAGNOSIS — K219 Gastro-esophageal reflux disease without esophagitis: Secondary | ICD-10-CM

## 2017-10-06 MED ORDER — ALBUTEROL SULFATE HFA 108 (90 BASE) MCG/ACT IN AERS: 2.0000 | INHALATION_SPRAY | RESPIRATORY_TRACT | 2 refills | Status: DC | PRN

## 2017-10-06 MED ORDER — OMEPRAZOLE 20 MG PO CPDR: 20.0000 mg | DELAYED_RELEASE_CAPSULE | Freq: Every day | ORAL | 0 refills | Status: DC

## 2017-10-06 MED ORDER — GABAPENTIN 300 MG PO CAPS: 300.0000 mg | ORAL_CAPSULE | Freq: Two times a day (BID) | ORAL | 0 refills | Status: DC

## 2017-10-06 MED ORDER — LISINOPRIL 10 MG PO TABS: 10.0000 mg | ORAL_TABLET | Freq: Every day | ORAL | 0 refills | Status: DC

## 2017-10-06 MED ORDER — INSULIN LISPRO 100 UNIT/ML (KWIKPEN): 0.0000 [IU] | PEN_INJECTOR | Freq: Three times a day (TID) | SUBCUTANEOUS | 0 refills | Status: DC

## 2017-10-06 MED ORDER — ATORVASTATIN CALCIUM 10 MG PO TABS: 10.0000 mg | ORAL_TABLET | Freq: Every day | ORAL | 3 refills | Status: DC

## 2017-10-06 MED ORDER — INSULIN PEN NEEDLE 31G X 5 MM MISC: 1.0000 | Freq: Three times a day (TID) | 0 refills | Status: DC

## 2017-10-06 MED ORDER — INSULIN GLARGINE 100 UNIT/ML SOLOSTAR PEN
50.0000 [IU] | PEN_INJECTOR | Freq: Every day | SUBCUTANEOUS | 0 refills | Status: DC
Start: 2017-10-06 — End: 2017-11-24

## 2017-10-06 NOTE — Addendum Note (Signed)
Addended by: Santa LighterHAMMER, Ariah Mower K on: 10/06/2017 12:48 PM   Modules accepted: Orders

## 2017-10-06 NOTE — Telephone Encounter (Signed)
Pt called to [id number] Please follow up.

## 2017-10-06 NOTE — Telephone Encounter (Signed)
-  Pt called to inform that she will be [facility]. She has some concerns and would like some advice. -Remind the patient that she has to come and sign, to have her medication list released. Please follow up.

## 2017-10-06 NOTE — Telephone Encounter (Signed)
[facility]

## 2017-10-15 ENCOUNTER — Encounter (HOSPITAL_COMMUNITY): Payer: Self-pay

## 2017-10-15 ENCOUNTER — Emergency Department (HOSPITAL_COMMUNITY)
Admission: EM | Admit: 2017-10-15 | Discharge: 2017-10-15 | Disposition: A | Discharge status: Home / Self Care | Payer: Managed Care, Other (non HMO) | Attending: Emergency Medicine | Admitting: Emergency Medicine

## 2017-10-15 ENCOUNTER — Emergency Department (HOSPITAL_COMMUNITY): Payer: Managed Care, Other (non HMO)

## 2017-10-15 DIAGNOSIS — Z794 Long term (current) use of insulin: Secondary | ICD-10-CM | POA: Insufficient documentation

## 2017-10-15 DIAGNOSIS — I10 Essential (primary) hypertension: Secondary | ICD-10-CM | POA: Insufficient documentation

## 2017-10-15 DIAGNOSIS — S60121A Contusion of right index finger with damage to nail, initial encounter: Secondary | ICD-10-CM | POA: Insufficient documentation

## 2017-10-15 DIAGNOSIS — Y999 Unspecified external cause status: Secondary | ICD-10-CM | POA: Insufficient documentation

## 2017-10-15 DIAGNOSIS — E119 Type 2 diabetes mellitus without complications: Secondary | ICD-10-CM | POA: Diagnosis not present

## 2017-10-15 DIAGNOSIS — Y939 Activity, unspecified: Secondary | ICD-10-CM | POA: Insufficient documentation

## 2017-10-15 DIAGNOSIS — Y929 Unspecified place or not applicable: Secondary | ICD-10-CM | POA: Diagnosis not present

## 2017-10-15 DIAGNOSIS — Z79899 Other long term (current) drug therapy: Secondary | ICD-10-CM | POA: Diagnosis not present

## 2017-10-15 DIAGNOSIS — W231XXA Caught, crushed, jammed, or pinched between stationary objects, initial encounter: Secondary | ICD-10-CM | POA: Diagnosis not present

## 2017-10-15 DIAGNOSIS — F17210 Nicotine dependence, cigarettes, uncomplicated: Secondary | ICD-10-CM | POA: Insufficient documentation

## 2017-10-15 DIAGNOSIS — S6010XA Contusion of unspecified finger with damage to nail, initial encounter: Secondary | ICD-10-CM

## 2017-10-15 DIAGNOSIS — S6991XA Unspecified injury of right wrist, hand and finger(s), initial encounter: Secondary | ICD-10-CM | POA: Diagnosis present

## 2017-10-15 DIAGNOSIS — J45909 Unspecified asthma, uncomplicated: Secondary | ICD-10-CM | POA: Diagnosis not present

## 2017-10-15 MED ORDER — LIDOCAINE HCL 1 % IJ SOLN
INTRAMUSCULAR | Status: AC
  Filled 2017-10-15: qty 20

## 2017-10-15 MED ORDER — LIDOCAINE HCL (PF) 1 % IJ SOLN
30.0000 mL | Freq: Once | INTRAMUSCULAR | Status: DC
  Filled 2017-10-15: qty 30

## 2017-10-15 NOTE — Discharge Instructions (Signed)
Please read the attached information regarding your condition. Follow-up with hand specialist listed below for further evaluation. Take Tylenol or ibuprofen as needed for pain. Return to ED for worsening pain, signs of infection including redness, purulent drainage, fevers or additional injury.

## 2017-10-15 NOTE — ED Triage Notes (Signed)
Pt slammed his right index finger in a door a few weeks ago, he states its swollen and nail is turning black

## 2017-10-15 NOTE — ED Provider Notes (Signed)
[facility] DEPT Provider Note   CSN: [id number] Arrival date & time: 10/15/17  1906     History   Chief Complaint Chief Complaint  Patient presents with  . Hand Pain    HPI Christopher Douglas is a 26 y.o. male with past medical history of type 1 diabetes, who presents to ED for evaluation of right index finger pain and swelling as well as "nail turning black."  States that he accidentally slammed his index finger in a car door last week.  He has tried ibuprofen with some relief in his symptoms.  He reports that the swelling and pain has gotten worse.  He denies any previous fracture, dislocation or procedures in the area.  He denies any other symptoms at this time.  HPI  Past Medical History:  Diagnosis Date  . Asthma   . Diabetes mellitus without complication ([place])   . Hypertension     Patient Active Problem List   Diagnosis Date Noted  . GERD (gastroesophageal reflux disease) 05/12/2017  . Hyperlipidemia 04/06/2016  . Hypertension 06/29/2015  . Diabetic neuropathy ([place]) 09/16/2014  . DM type 1 (diabetes mellitus, type 1) ([place]) 01/04/2013  . Unspecified constipation 01/04/2013  . Asthma 01/03/2013    Past Surgical History:  Procedure Laterality Date  . FOOT SURGERY         Home Medications    Prior to Admission medications   Medication Sig Start Date End Date Taking? Authorizing Provider  albuterol (PROVENTIL HFA;VENTOLIN HFA) 108 (90 Base) MCG/ACT inhaler Inhale 2 puffs every 4 (four) hours as needed into the lungs for wheezing or shortness of breath. 10/06/17   Arnoldo Morale, MD  atorvastatin (LIPITOR) 10 MG tablet Take 1 tablet (10 mg total) daily by mouth. 10/06/17   Arnoldo Morale, MD  Blood Glucose Monitoring Suppl (CONTOUR NEXT ONE) KIT 1 each by Does not apply route 3 (three) times daily before meals. 04/06/16   Arnoldo Morale, MD  gabapentin (NEURONTIN) 300 MG capsule Take 1 capsule (300 mg total) 2 (two) times daily by mouth.  10/06/17   Arnoldo Morale, MD  glucose blood (ONETOUCH VERIO) test strip 1 each by Other route 2 (two) times daily. And lancets 2/day 09/12/14   Hoyt Koch, MD  ibuprofen (ADVIL,MOTRIN) 800 MG tablet Take 1 tablet (800 mg total) by mouth every 8 (eight) hours as needed. Patient taking differently: Take 800 mg by mouth every 8 (eight) hours as needed for moderate pain.  06/10/16   Lawyer, Harrell Gave, PA-C  Insulin Glargine (LANTUS SOLOSTAR) 100 UNIT/ML Solostar Pen Inject 50 Units daily at 10 pm into the skin. 10/06/17   Arnoldo Morale, MD  insulin lispro (HUMALOG KWIKPEN) 100 UNIT/ML KiwkPen Inject 0-0.12 mLs (0-12 Units total) 3 (three) times daily into the skin. As per sliding scale 10/06/17   Arnoldo Morale, MD  Insulin Pen Needle 31G X 5 MM MISC 1 each 4 (four) times daily -  with meals and at bedtime by Does not apply route. 10/06/17   Arnoldo Morale, MD  lisinopril (PRINIVIL,ZESTRIL) 10 MG tablet Take 1 tablet (10 mg total) daily by mouth. 10/06/17   Arnoldo Morale, MD  omeprazole (PRILOSEC) 20 MG capsule Take 1 capsule (20 mg total) daily by mouth. 10/06/17   Arnoldo Morale, MD    Family History Family History  Problem Relation Age of Onset  . Diabetes Mellitus II Father   . Hypertension Father   . Alcohol abuse Sister   . Alcohol abuse Brother   .  Diabetes Maternal Grandmother   . Diabetes Maternal Grandfather   . Hypertension Maternal Grandfather   . Hypertension Mother     Social History Social History   Tobacco Use  . Smoking status: Current Every Day Smoker    Packs/day: 0.25    Years: 5.00    Pack years: 1.25    Types: Cigarettes  . Smokeless tobacco: Never Used  Substance Use Topics  . Alcohol use: No  . Drug use: No     Allergies   Amoxicillin and Shellfish allergy   Review of Systems Review of Systems  Constitutional: Negative for chills and fever.  Musculoskeletal: Positive for arthralgias and joint swelling. Negative for myalgias.  Skin: Positive  for color change and wound.     Physical Exam Updated Vital Signs BP 139/79 (BP Location: Left Arm)   Pulse 86   Temp 98.6 F (37 C) (Oral)   Resp 18   SpO2 99%   Physical Exam  Constitutional: He appears well-developed and well-nourished. No distress.  HENT:  Head: Normocephalic and atraumatic.  Eyes: Conjunctivae and EOM are normal. No scleral icterus.  Neck: Normal range of motion.  Pulmonary/Chest: Effort normal. No respiratory distress.  Musculoskeletal: Normal range of motion. He exhibits tenderness. He exhibits no edema or deformity.  Mild edema noted surrounding the DIP joint of the right index finger.  Subungual hematoma noted.  Full active and passive range of motion of digits with tenderness to palpation of the nail bed.  Neurological: He is alert.  Skin: No rash noted. He is not diaphoretic.  Psychiatric: He has a normal mood and affect.  Nursing note and vitals reviewed.    ED Treatments / Results  Labs (all labs ordered are listed, but only abnormal results are displayed) Labs Reviewed - No data to display  EKG  EKG Interpretation None       Radiology Dg Finger Index Right  Result Date: 10/15/2017 CLINICAL DATA:  Patient with injury to the right index finger. Initial encounter. EXAM: RIGHT INDEX FINGER 2+V COMPARISON:  None. FINDINGS: Normal anatomic alignment. No evidence for acute fracture or dislocation. Regional soft tissues unremarkable. IMPRESSION: No acute osseous abnormality. Electronically Signed   By: Lovey Newcomer M.D.   On: 10/15/2017 21:07    Procedures .Marland KitchenIncision and Drainage Date/Time: 10/15/2017 10:32 PM Performed by: Delia Heady, PA-C Authorized by: Delia Heady, PA-C   Consent:    Consent obtained:  Verbal   Consent given by:  Patient   Risks discussed:  Bleeding, incomplete drainage and pain Location:    Type:  Subungual hematoma   Location:  Upper extremity   Upper extremity location:  Finger   Finger location:  R index  finger Pre-procedure details:    Skin preparation:  Chloraprep Anesthesia (see MAR for exact dosages):    Anesthesia method:  Local infiltration   Local anesthetic:  Lidocaine 1% w/o epi Procedure details:    Incision type: Cautery on nail bed. Post-procedure details:    Patient tolerance of procedure:  Tolerated well, no immediate complications   (including critical care time)   Medications Ordered in ED Medications  lidocaine (PF) (XYLOCAINE) 1 % injection 30 mL (not administered)  lidocaine (XYLOCAINE) 1 % (with pres) injection (not administered)     Initial Impression / Assessment and Plan / ED Course  I have reviewed the triage vital signs and the nursing notes.  Pertinent labs & imaging results that were available during my care of the patient were reviewed by  me and considered in my medical decision making (see chart for details).     Patient presents to ED for evaluation of right index finger pain and swelling as well as discoloration of nail after accidentally slamming his finger in a car door last week.  On physical exam there is a subungual hematoma noted approximately 50% of the size of the nail.  He has otherwise full active and passive range of motion of the digit.  X-ray was negative for acute osseous abnormality.  Pressure was relieved with cautery pen.  Patient will be placed in finger splint and advised to follow-up with hand specialist for further evaluation.  No signs of infection noted in digit.  Patient appears stable for discharge at this time.  Strict return precautions given.  Final Clinical Impressions(s) / ED Diagnoses   Final diagnoses:  Subungual hematoma of finger of right hand, initial encounter    ED Discharge Orders    None          Delia Heady, PA-C 10/15/17 2232    Lacretia Leigh, MD 10/18/17 [phone]

## 2017-10-16 ENCOUNTER — Telehealth: Payer: Self-pay | Admitting: Family Medicine

## 2017-10-16 NOTE — Telephone Encounter (Signed)
Patient called asking for humalong refill please fu

## 2017-10-19 ENCOUNTER — Telehealth: Payer: Self-pay | Admitting: Family Medicine

## 2017-10-19 ENCOUNTER — Other Ambulatory Visit: Payer: Self-pay

## 2017-10-19 DIAGNOSIS — E1049 Type 1 diabetes mellitus with other diabetic neurological complication: Secondary | ICD-10-CM

## 2017-10-19 MED ORDER — INSULIN LISPRO 100 UNIT/ML (KWIKPEN): 0.0000 [IU] | PEN_INJECTOR | Freq: Three times a day (TID) | SUBCUTANEOUS | 0 refills | Status: DC

## 2017-10-19 NOTE — Telephone Encounter (Signed)
[facility]

## 2017-10-19 NOTE — Telephone Encounter (Signed)
Please [facility] at [phone] thank you. Please fu

## 2017-11-01 ENCOUNTER — Ambulatory Visit: Payer: Managed Care, Other (non HMO) | Admitting: Family Medicine

## 2017-11-24 ENCOUNTER — Encounter: Payer: Self-pay | Admitting: Family Medicine

## 2017-11-24 ENCOUNTER — Ambulatory Visit: Payer: Managed Care, Other (non HMO) | Attending: Family Medicine | Admitting: Family Medicine

## 2017-11-24 VITALS — BP 122/71 | HR 85 | Temp 98.1°F | Wt 262.2 lb

## 2017-11-24 DIAGNOSIS — E7800 Pure hypercholesterolemia, unspecified: Secondary | ICD-10-CM | POA: Diagnosis not present

## 2017-11-24 DIAGNOSIS — I10 Essential (primary) hypertension: Secondary | ICD-10-CM | POA: Diagnosis not present

## 2017-11-24 DIAGNOSIS — Z880 Allergy status to penicillin: Secondary | ICD-10-CM | POA: Diagnosis not present

## 2017-11-24 DIAGNOSIS — E1049 Type 1 diabetes mellitus with other diabetic neurological complication: Secondary | ICD-10-CM

## 2017-11-24 DIAGNOSIS — Z79899 Other long term (current) drug therapy: Secondary | ICD-10-CM | POA: Diagnosis not present

## 2017-11-24 DIAGNOSIS — Z794 Long term (current) use of insulin: Secondary | ICD-10-CM | POA: Diagnosis not present

## 2017-11-24 DIAGNOSIS — K219 Gastro-esophageal reflux disease without esophagitis: Secondary | ICD-10-CM | POA: Diagnosis not present

## 2017-11-24 DIAGNOSIS — J45909 Unspecified asthma, uncomplicated: Secondary | ICD-10-CM | POA: Diagnosis not present

## 2017-11-24 DIAGNOSIS — E109 Type 1 diabetes mellitus without complications: Secondary | ICD-10-CM | POA: Diagnosis not present

## 2017-11-24 DIAGNOSIS — Z91013 Allergy to seafood: Secondary | ICD-10-CM | POA: Diagnosis not present

## 2017-11-24 DIAGNOSIS — E1040 Type 1 diabetes mellitus with diabetic neuropathy, unspecified: Secondary | ICD-10-CM | POA: Diagnosis not present

## 2017-11-24 DIAGNOSIS — Z9114 Patient's other noncompliance with medication regimen: Secondary | ICD-10-CM | POA: Diagnosis not present

## 2017-11-24 DIAGNOSIS — E1065 Type 1 diabetes mellitus with hyperglycemia: Secondary | ICD-10-CM

## 2017-11-24 LAB — POCT GLYCOSYLATED HEMOGLOBIN (HGB A1C): Hemoglobin A1C: 13.1

## 2017-11-24 LAB — GLUCOSE, POCT (MANUAL RESULT ENTRY): POC GLUCOSE: 371 mg/dL — AB (ref 70–99)

## 2017-11-24 MED ORDER — ALBUTEROL SULFATE HFA 108 (90 BASE) MCG/ACT IN AERS: 2.0000 | INHALATION_SPRAY | RESPIRATORY_TRACT | 2 refills | Status: DC | PRN

## 2017-11-24 MED ORDER — GLUCOSE BLOOD VI STRP: ORAL_STRIP | 12 refills | Status: DC

## 2017-11-24 MED ORDER — ATORVASTATIN CALCIUM 10 MG PO TABS: 10.0000 mg | ORAL_TABLET | Freq: Every day | ORAL | 3 refills | Status: DC

## 2017-11-24 MED ORDER — GABAPENTIN 300 MG PO CAPS: 300.0000 mg | ORAL_CAPSULE | Freq: Two times a day (BID) | ORAL | 3 refills | Status: DC

## 2017-11-24 MED ORDER — INSULIN LISPRO 100 UNIT/ML (KWIKPEN): 0.0000 [IU] | PEN_INJECTOR | Freq: Three times a day (TID) | SUBCUTANEOUS | 3 refills | Status: DC

## 2017-11-24 MED ORDER — OMEPRAZOLE 20 MG PO CPDR: 20.0000 mg | DELAYED_RELEASE_CAPSULE | Freq: Every day | ORAL | 3 refills | Status: DC

## 2017-11-24 MED ORDER — INSULIN GLARGINE 100 UNIT/ML SOLOSTAR PEN: 55.0000 [IU] | PEN_INJECTOR | Freq: Every day | SUBCUTANEOUS | 3 refills | Status: DC

## 2017-11-24 MED ORDER — ONETOUCH ULTRASOFT LANCETS MISC: 12 refills | Status: DC

## 2017-11-24 MED ORDER — ONETOUCH ULTRA MINI W/DEVICE KIT: 1.0000 | PACK | Freq: Three times a day (TID) | 0 refills | Status: DC

## 2017-11-24 MED ORDER — LISINOPRIL 10 MG PO TABS: 10.0000 mg | ORAL_TABLET | Freq: Every day | ORAL | 3 refills | Status: DC

## 2017-11-24 NOTE — Progress Notes (Signed)
Subjective:  Patient ID: Christopher Douglas, male    DOB: Apr 07, 1991  Age: 27 y.o. MRN: [id number]  CC: Diabetes   HPI Christopher Douglas  is a 27 year old male with history of type 1 diabetes mellitus (A1c 13.1), hypertension, hyperlipidemia who comes to the clinic for a follow-up visit.  He endorses running out of his fast acting insulin for about 1 week but states he has been compliant with his Lantus and his fasting sugars have been in the 170 range.  He does have diabetic neuropathy which has been controlled. Denies visual concerns or hypoglycemic episodes. Does not exercise regularly.  He remains on astatin and endorses compliance; denies myalgias.  Reflux symptoms have been controlled and he denies nausea, vomiting, dyspepsia, diarrhea or constipation.  Past Medical History:  Diagnosis Date  . Asthma   . Diabetes mellitus without complication ([place])   . Hypertension     Past Surgical History:  Procedure Laterality Date  . FOOT SURGERY      Allergies  Allergen Reactions  . Amoxicillin Hives    Breaks out in hives  . Shellfish Allergy Hives    Specifically shrimp unknown     Outpatient Medications Prior to Visit  Medication Sig Dispense Refill  . Blood Glucose Monitoring Suppl (CONTOUR NEXT ONE) KIT 1 each by Does not apply route 3 (three) times daily before meals. 1 kit 12  . glucose blood (ONETOUCH VERIO) test strip 1 each by Other route 2 (two) times daily. And lancets 2/day 100 each 12  . Insulin Pen Needle 31G X 5 MM MISC 1 each 4 (four) times daily -  with meals and at bedtime by Does not apply route. 100 each 0  . albuterol (PROVENTIL HFA;VENTOLIN HFA) 108 (90 Base) MCG/ACT inhaler Inhale 2 puffs every 4 (four) hours as needed into the lungs for wheezing or shortness of breath. 1 Inhaler 2  . atorvastatin (LIPITOR) 10 MG tablet Take 1 tablet (10 mg total) daily by mouth. 30 tablet 3  . gabapentin (NEURONTIN) 300 MG capsule Take 1 capsule (300 mg total) 2 (two) times  daily by mouth. 60 capsule 0  . Insulin Glargine (LANTUS SOLOSTAR) 100 UNIT/ML Solostar Pen Inject 50 Units daily at 10 pm into the skin. 5 pen 0  . insulin lispro (HUMALOG KWIKPEN) 100 UNIT/ML KiwkPen Inject 0-0.12 mLs (0-12 Units total) into the skin 3 (three) times daily. As per sliding scale 15 mL 0  . lisinopril (PRINIVIL,ZESTRIL) 10 MG tablet Take 1 tablet (10 mg total) daily by mouth. 30 tablet 0  . omeprazole (PRILOSEC) 20 MG capsule Take 1 capsule (20 mg total) daily by mouth. 30 capsule 0  . ibuprofen (ADVIL,MOTRIN) 800 MG tablet Take 1 tablet (800 mg total) by mouth every 8 (eight) hours as needed. (Patient not taking: Reported on 11/24/2017) 21 tablet 0   No facility-administered medications prior to visit.     ROS Review of Systems  Constitutional: Negative for activity change and appetite change.  HENT: Negative for sinus pressure and sore throat.   Eyes: Negative for visual disturbance.  Respiratory: Negative for cough, chest tightness and shortness of breath.   Cardiovascular: Negative for chest pain and leg swelling.  Gastrointestinal: Negative for abdominal distention, abdominal pain, constipation and diarrhea.  Endocrine: Negative.   Genitourinary: Negative for dysuria.  Musculoskeletal: Negative for joint swelling and myalgias.  Skin: Negative for rash.  Allergic/Immunologic: Negative.   Neurological: Negative for weakness, light-headedness and numbness.  Psychiatric/Behavioral: Negative for dysphoric  mood and suicidal ideas.    Objective:  BP 122/71   Pulse 85   Temp 98.1 F (36.7 C) (Oral)   Wt 262 lb 3.2 oz (118.9 kg)   SpO2 100%   BMI 32.77 kg/m   BP/Weight 11/24/2017 10/15/2017 7/51/0258  Systolic BP 527 782 423  Diastolic BP 71 79 74  Wt. (Lbs) 262.2 - 269  BMI 32.77 - 33.62      Physical Exam  Constitutional: He is oriented to person, place, and time. He appears well-developed and well-nourished.  Cardiovascular: Normal rate, normal heart sounds  and intact distal pulses.  No murmur heard. Pulmonary/Chest: Effort normal and breath sounds normal. He has no wheezes. He has no rales. He exhibits no tenderness.  Abdominal: Soft. Bowel sounds are normal. He exhibits no distension and no mass. There is no tenderness.  Musculoskeletal: Normal range of motion.  Neurological: He is alert and oriented to person, place, and time.  Skin:  R index finger subungual hematoma  Psychiatric: He has a normal mood and affect.    CMP Latest Ref Rng & Units 07/03/2017 04/13/2017 03/25/2016  Glucose 65 - 99 mg/dL 589(HH) 310(H) 243(H)  BUN 6 - 20 mg/dL '8 9 6  ' Creatinine 0.61 - 1.24 mg/dL 1.28(H) 1.14 0.98  Sodium 135 - 145 mmol/L 130(L) 133(L) 136  Potassium 3.5 - 5.1 mmol/L 4.4 3.9 3.9  Chloride 101 - 111 mmol/L 96(L) 98(L) 100(L)  CO2 22 - 32 mmol/L '22 26 25  ' Calcium 8.9 - 10.3 mg/dL 9.3 9.2 9.3  Total Protein 6.0 - 8.3 g/dL - - -  Total Bilirubin 0.2 - 1.2 mg/dL - - -  Alkaline Phos 39 - 117 U/L - - -  AST 0 - 37 U/L - - -  ALT 0 - 53 U/L - - -     Lipid Panel     Component Value Date/Time   CHOL 216 (H) 09/16/2014 1546   TRIG 124.0 09/16/2014 1546   HDL 55.50 09/16/2014 1546   CHOLHDL 4 09/16/2014 1546   VLDL 24.8 09/16/2014 1546   LDLCALC 136 (H) 09/16/2014 1546    Lab Results  Component Value Date   HGBA1C 13.1 11/24/2017     Assessment & Plan:   1. Type 1 diabetes mellitus with hyperglycemia (HCC) Uncontrolled with A1c of 13.1 due to noncompliance Increase dose of Lantus Continue NovoLog sliding scale Encouraged to comply with a diabetic diet-we have discussed my plate method, healthy snacking and he promises to do better Lifestyle modifications including 150 minutes of moderate intensity exercise per week has been discussed. - POCT glucose (manual entry) - POCT glycosylated hemoglobin (Hb A1C) - Insulin Glargine (LANTUS SOLOSTAR) 100 UNIT/ML Solostar Pen; Inject 55 Units into the skin daily at 10 pm.  Dispense: 5 pen;  Refill: 3 - Lancets (ONETOUCH ULTRASOFT) lancets; Use as instructed 3 times daily before meals  Dispense: 100 each; Refill: 12 - Blood Glucose Monitoring Suppl (ONE TOUCH ULTRA MINI) w/Device KIT; 1 each by Does not apply route 3 (three) times daily.  Dispense: 1 each; Refill: 0 - glucose blood (ONE TOUCH ULTRA TEST) test strip; Use as instructed three times daily before meals  Dispense: 100 each; Refill: 12  2. Gastroesophageal reflux disease without esophagitis Controlled - omeprazole (PRILOSEC) 20 MG capsule; Take 1 capsule (20 mg total) by mouth daily.  Dispense: 30 capsule; Refill: 3  3. Essential hypertension Controlled Low-sodium, DASH diet - lisinopril (PRINIVIL,ZESTRIL) 10 MG tablet; Take 1 tablet (10 mg total)  by mouth daily.  Dispense: 30 tablet; Refill: 3  4. 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 As per sliding scale  Dispense: 15 mL; Refill: 3 - gabapentin (NEURONTIN) 300 MG capsule; Take 1 capsule (300 mg total) by mouth 2 (two) times daily.  Dispense: 60 capsule; Refill: 3  5. Pure hypercholesterolemia Uncontrolled We will check lipid panel at next visit Low-cholesterol diet - atorvastatin (LIPITOR) 10 MG tablet; Take 1 tablet (10 mg total) by mouth daily.  Dispense: 30 tablet; Refill: 3   Meds ordered this encounter  Medications  . Insulin Glargine (LANTUS SOLOSTAR) 100 UNIT/ML Solostar Pen    Sig: Inject 55 Units into the skin daily at 10 pm.    Dispense:  5 pen    Refill:  3    Discontinue previous dose  . omeprazole (PRILOSEC) 20 MG capsule    Sig: Take 1 capsule (20 mg total) by mouth daily.    Dispense:  30 capsule    Refill:  3  . lisinopril (PRINIVIL,ZESTRIL) 10 MG tablet    Sig: Take 1 tablet (10 mg total) by mouth daily.    Dispense:  30 tablet    Refill:  3  .  insulin lispro (HUMALOG KWIKPEN) 100 UNIT/ML KiwkPen    Sig: Inject 0-0.12 mLs (0-12 Units total) into the skin 3 (three) times daily. As per sliding scale    Dispense:  15 mL    Refill:  3  . gabapentin (NEURONTIN) 300 MG capsule    Sig: Take 1 capsule (300 mg total) by mouth 2 (two) times daily.    Dispense:  60 capsule    Refill:  3  . atorvastatin (LIPITOR) 10 MG tablet    Sig: Take 1 tablet (10 mg total) by mouth daily.    Dispense:  30 tablet    Refill:  3  . albuterol (PROVENTIL HFA;VENTOLIN HFA) 108 (90 Base) MCG/ACT inhaler    Sig: Inhale 2 puffs into the lungs every 4 (four) hours as needed for wheezing or shortness of breath.    Dispense:  1 Inhaler    Refill:  2  . Lancets (ONETOUCH ULTRASOFT) lancets    Sig: Use as instructed 3 times daily before meals    Dispense:  100 each    Refill:  12  . Blood Glucose Monitoring Suppl (ONE TOUCH ULTRA MINI) w/Device KIT    Sig: 1 each by Does not apply route 3 (three) times daily.    Dispense:  1 each    Refill:  0  . glucose blood (ONE TOUCH ULTRA TEST) test strip    Sig: Use as instructed three times daily before meals    Dispense:  100 each    Refill:  12    Follow-up: Return in about 3 months (around 02/22/2018) for Follow-up on diabetes mellitus.   Arnoldo Morale MD

## 2017-11-24 NOTE — Patient Instructions (Signed)
Diabetes Mellitus and Nutrition When you have diabetes (diabetes mellitus), it is very important to have healthy eating habits because your blood sugar (glucose) levels are greatly affected by what you eat and drink. Eating healthy foods in the appropriate amounts, at about the same times every day, can help you:  Control your blood glucose.  Lower your risk of heart disease.  Improve your blood pressure.  Reach or maintain a healthy weight.  Every person with diabetes is different, and each person has different needs for a meal plan. Your health care provider may recommend that you work with a diet and nutrition specialist (dietitian) to make a meal plan that is best for you. Your meal plan may vary depending on factors such as:  The calories you need.  The medicines you take.  Your weight.  Your blood glucose, blood pressure, and cholesterol levels.  Your activity level.  Other health conditions you have, such as heart or kidney disease.  How do carbohydrates affect me? Carbohydrates affect your blood glucose level more than any other type of food. Eating carbohydrates naturally increases the amount of glucose in your blood. Carbohydrate counting is a method for keeping track of how many carbohydrates you eat. Counting carbohydrates is important to keep your blood glucose at a healthy level, especially if you use insulin or take certain oral diabetes medicines. It is important to know how many carbohydrates you can safely have in each meal. This is different for every person. Your dietitian can help you calculate how many carbohydrates you should have at each meal and for snack. Foods that contain carbohydrates include:  Bread, cereal, rice, pasta, and crackers.  Potatoes and corn.  Peas, beans, and lentils.  Milk and yogurt.  Fruit and juice.  Desserts, such as cakes, cookies, ice cream, and candy.  How does alcohol affect me? Alcohol can cause a sudden decrease in blood  glucose (hypoglycemia), especially if you use insulin or take certain oral diabetes medicines. Hypoglycemia can be a life-threatening condition. Symptoms of hypoglycemia (sleepiness, dizziness, and confusion) are similar to symptoms of having too much alcohol. If your health care provider says that alcohol is safe for you, follow these guidelines:  Limit alcohol intake to no more than 1 drink per day for nonpregnant women and 2 drinks per day for men. One drink equals 12 oz of beer, 5 oz of wine, or 1 oz of hard liquor.  Do not drink on an empty stomach.  Keep yourself hydrated with water, diet soda, or unsweetened iced tea.  Keep in mind that regular soda, juice, and other mixers may contain a lot of sugar and must be counted as carbohydrates.  What are tips for following this plan? Reading food labels  Start by checking the serving size on the label. The amount of calories, carbohydrates, fats, and other nutrients listed on the label are based on one serving of the food. Many foods contain more than one serving per package.  Check the total grams (g) of carbohydrates in one serving. You can calculate the number of servings of carbohydrates in one serving by dividing the total carbohydrates by 15. For example, if a food has 30 g of total carbohydrates, it would be equal to 2 servings of carbohydrates.  Check the number of grams (g) of saturated and trans fats in one serving. Choose foods that have low or no amount of these fats.  Check the number of milligrams (mg) of sodium in one serving. Most people   should limit total sodium intake to less than 2,300 mg per day.  Always check the nutrition information of foods labeled as "low-fat" or "nonfat". These foods may be higher in added sugar or refined carbohydrates and should be avoided.  Talk to your dietitian to identify your daily goals for nutrients listed on the label. Shopping  Avoid buying canned, premade, or processed foods. These  foods tend to be high in fat, sodium, and added sugar.  Shop around the outside edge of the grocery store. This includes fresh fruits and vegetables, bulk grains, fresh meats, and fresh dairy. Cooking  Use low-heat cooking methods, such as baking, instead of high-heat cooking methods like deep frying.  Cook using healthy oils, such as olive, canola, or sunflower oil.  Avoid cooking with butter, cream, or high-fat meats. Meal planning  Eat meals and snacks regularly, preferably at the same times every day. Avoid going long periods of time without eating.  Eat foods high in fiber, such as fresh fruits, vegetables, beans, and whole grains. Talk to your dietitian about how many servings of carbohydrates you can eat at each meal.  Eat 4-6 ounces of lean protein each day, such as lean meat, chicken, fish, eggs, or tofu. 1 ounce is equal to 1 ounce of meat, chicken, or fish, 1 egg, or 1/4 cup of tofu.  Eat some foods each day that contain healthy fats, such as avocado, nuts, seeds, and fish. Lifestyle   Check your blood glucose regularly.  Exercise at least 30 minutes 5 or more days each week, or as told by your health care provider.  Take medicines as told by your health care provider.  Do not use any products that contain nicotine or tobacco, such as cigarettes and e-cigarettes. If you need help quitting, ask your health care provider.  Work with a counselor or diabetes educator to identify strategies to manage stress and any emotional and social challenges. What are some questions to ask my health care provider?  Do I need to meet with a diabetes educator?  Do I need to meet with a dietitian?  What number can I call if I have questions?  When are the best times to check my blood glucose? Where to find more information:  American Diabetes Association: diabetes.org/food-and-fitness/food  Academy of Nutrition and Dietetics:  www.eatright.org/resources/health/diseases-and-conditions/diabetes  National Institute of Diabetes and Digestive and Kidney Diseases (NIH): www.niddk.nih.gov/health-information/diabetes/overview/diet-eating-physical-activity Summary  A healthy meal plan will help you control your blood glucose and maintain a healthy lifestyle.  Working with a diet and nutrition specialist (dietitian) can help you make a meal plan that is best for you.  Keep in mind that carbohydrates and alcohol have immediate effects on your blood glucose levels. It is important to count carbohydrates and to use alcohol carefully. This information is not intended to replace advice given to you by your health care provider. Make sure you discuss any questions you have with your health care provider. Document Released: 08/04/2005 Document Revised: 12/12/2016 Document Reviewed: 12/12/2016 Elsevier Interactive Patient Education  2018 Elsevier Inc.  

## 2018-04-18 ENCOUNTER — Encounter (HOSPITAL_COMMUNITY): Payer: Self-pay

## 2018-04-18 ENCOUNTER — Emergency Department (HOSPITAL_COMMUNITY)
Admission: EM | Admit: 2018-04-18 | Discharge: 2018-04-18 | Disposition: A | Discharge status: Home / Self Care | Payer: Managed Care, Other (non HMO) | Attending: Emergency Medicine | Admitting: Emergency Medicine

## 2018-04-18 ENCOUNTER — Other Ambulatory Visit: Payer: Self-pay

## 2018-04-18 DIAGNOSIS — J45909 Unspecified asthma, uncomplicated: Secondary | ICD-10-CM | POA: Diagnosis not present

## 2018-04-18 DIAGNOSIS — F17210 Nicotine dependence, cigarettes, uncomplicated: Secondary | ICD-10-CM | POA: Diagnosis not present

## 2018-04-18 DIAGNOSIS — Z79899 Other long term (current) drug therapy: Secondary | ICD-10-CM | POA: Insufficient documentation

## 2018-04-18 DIAGNOSIS — Z794 Long term (current) use of insulin: Secondary | ICD-10-CM | POA: Insufficient documentation

## 2018-04-18 DIAGNOSIS — I10 Essential (primary) hypertension: Secondary | ICD-10-CM | POA: Insufficient documentation

## 2018-04-18 DIAGNOSIS — K0889 Other specified disorders of teeth and supporting structures: Secondary | ICD-10-CM

## 2018-04-18 DIAGNOSIS — E119 Type 2 diabetes mellitus without complications: Secondary | ICD-10-CM | POA: Insufficient documentation

## 2018-04-18 MED ORDER — NAPROXEN 500 MG PO TABS: 500.0000 mg | ORAL_TABLET | Freq: Two times a day (BID) | ORAL | 0 refills | Status: DC

## 2018-04-18 MED ORDER — TRAMADOL HCL 50 MG PO TABS: 50.0000 mg | ORAL_TABLET | Freq: Four times a day (QID) | ORAL | 0 refills | Status: DC | PRN

## 2018-04-18 MED ORDER — OXYCODONE-ACETAMINOPHEN 5-325 MG PO TABS
1.0000 | ORAL_TABLET | Freq: Once | ORAL | Status: AC
  Administered 2018-04-18: 1 via ORAL
  Filled 2018-04-18: qty 1

## 2018-04-18 MED ORDER — CLINDAMYCIN HCL 300 MG PO CAPS: 300.0000 mg | ORAL_CAPSULE | Freq: Three times a day (TID) | ORAL | 0 refills | Status: DC

## 2018-04-18 NOTE — Discharge Instructions (Addendum)
Please use the attached guide to help you find a local dentist to manage your tooth pain.  Please take Naprosyn 500 mg twice daily to help with your pain.  I have also written you a prescription for tramadol which can help with breakthrough pain.  Remember that this medicine can make you drowsy so please do not drive or work while taking it.  Your blood pressure was elevated in the ER today, please have this rechecked by your regular doctor.  Return to the ER if you have any new or concerning symptoms like fever greater than 100.4 F, swelling in your face or neck.

## 2018-04-18 NOTE — ED Provider Notes (Signed)
[facility] DEPT Provider Note   CSN: [id number] Arrival date & time: 04/18/18  1534     History   Chief Complaint Chief Complaint  Patient presents with  . Dental Pain    HPI Christopher Douglas is a 27 y.o. male.  HPI  Patient is a 27 year old male with a history of type 1 diabetes and hypertension who presented to the emergency department for evaluation of right lower dental pain.  Patient reports that he cracked his tooth several months ago.  Reports pain did not start until about a week ago.  It was gradual in onset, he reports that pain feels throbbing and is 8/10 in severity.  He has tried over-the-counter topical numbing agents like Orajel which helped initially but are no longer alleviating the pain.  He tried calling his dentist today but was unable to get an appointment which is why he came to the ER for further pain management.  He denies fever, chills, facial swelling, neck pain/swelling, trismus, dysphagia. States he is allergic to amoxicillin.  Past Medical History:  Diagnosis Date  . Asthma   . Diabetes mellitus without complication ([facility])   . Hypertension     Patient Active Problem List   Diagnosis Date Noted  . GERD (gastroesophageal reflux disease) 05/12/2017  . Hyperlipidemia 04/06/2016  . Hypertension 06/29/2015  . Diabetic neuropathy ([place]) 09/16/2014  . DM type 1 (diabetes mellitus, type 1) ([place]) 01/04/2013  . Unspecified constipation 01/04/2013  . Asthma 01/03/2013    Past Surgical History:  Procedure Laterality Date  . FOOT SURGERY          Home Medications    Prior to Admission medications   Medication Sig Start Date End Date Taking? Authorizing Provider  albuterol (PROVENTIL HFA;VENTOLIN HFA) 108 (90 Base) MCG/ACT inhaler Inhale 2 puffs into the lungs every 4 (four) hours as needed for wheezing or shortness of breath. 11/24/17   Charlott Rakes, MD  atorvastatin (LIPITOR) 10 MG tablet Take 1 tablet (10 mg total) by  mouth daily. 11/24/17   Charlott Rakes, MD  Blood Glucose Monitoring Suppl (CONTOUR NEXT ONE) KIT 1 each by Does not apply route 3 (three) times daily before meals. 04/06/16   Charlott Rakes, MD  Blood Glucose Monitoring Suppl (ONE TOUCH ULTRA MINI) w/Device KIT 1 each by Does not apply route 3 (three) times daily. 11/24/17   Charlott Rakes, MD  gabapentin (NEURONTIN) 300 MG capsule Take 1 capsule (300 mg total) by mouth 2 (two) times daily. 11/24/17   Charlott Rakes, MD  glucose blood (ONE TOUCH ULTRA TEST) test strip Use as instructed three times daily before meals 11/24/17   Charlott Rakes, MD  glucose blood (ONETOUCH VERIO) test strip 1 each by Other route 2 (two) times daily. And lancets 2/day 09/12/14   Hoyt Koch, MD  ibuprofen (ADVIL,MOTRIN) 800 MG tablet Take 1 tablet (800 mg total) by mouth every 8 (eight) hours as needed. Patient not taking: Reported on 11/24/2017 06/10/16   Dalia Heading, PA-C  Insulin Glargine (LANTUS SOLOSTAR) 100 UNIT/ML Solostar Pen Inject 55 Units into the skin daily at 10 pm. 11/24/17   Charlott Rakes, MD  insulin lispro (HUMALOG KWIKPEN) 100 UNIT/ML KiwkPen Inject 0-0.12 mLs (0-12 Units total) into the skin 3 (three) times daily. As per sliding scale 11/24/17   Charlott Rakes, MD  Insulin Pen Needle 31G X 5 MM MISC 1 each 4 (four) times daily -  with meals and at bedtime by Does not apply route.  10/06/17   Charlott Rakes, MD  Lancets (ONETOUCH ULTRASOFT) lancets Use as instructed 3 times daily before meals 11/24/17   Charlott Rakes, MD  lisinopril (PRINIVIL,ZESTRIL) 10 MG tablet Take 1 tablet (10 mg total) by mouth daily. 11/24/17   Charlott Rakes, MD  omeprazole (PRILOSEC) 20 MG capsule Take 1 capsule (20 mg total) by mouth daily. 11/24/17   Charlott Rakes, MD    Family History Family History  Problem Relation Age of Onset  . Diabetes Mellitus II Father   . Hypertension Father   . Alcohol abuse Sister   . Alcohol abuse Brother   . Diabetes Maternal  Grandmother   . Diabetes Maternal Grandfather   . Hypertension Maternal Grandfather   . Hypertension Mother     Social History Social History   Tobacco Use  . Smoking status: Current Every Day Smoker    Packs/day: 0.15    Years: 5.00    Pack years: 0.75    Types: Cigarettes  . Smokeless tobacco: Never Used  Substance Use Topics  . Alcohol use: No  . Drug use: No     Allergies   Amoxicillin and Shellfish allergy   Review of Systems Review of Systems  Constitutional: Negative for chills and fever.  HENT: Positive for dental problem. Negative for facial swelling and trouble swallowing.   Respiratory: Negative for shortness of breath.   Musculoskeletal: Negative for neck pain and neck stiffness.     Physical Exam Updated Vital Signs BP (!) 149/79 (BP Location: Right Arm)   Pulse (!) 103   Temp 98 F (36.7 C) (Oral)   Resp 17   Ht '6\' 2"'  (1.88 m)   Wt 117.9 kg (260 lb)   SpO2 97%   BMI 33.38 kg/m   Physical Exam  Constitutional: He appears well-developed and well-nourished. No distress.  HENT:  Head: Normocephalic and atraumatic.  Mouth/Throat:    Dental cavities and poor oral dentition noted. Cracked right lower molar with associated pain. No abscess noted. Midline uvula. No trismus. OP moist and clear. No oropharyngeal erythema or edema. Neck supple with no tenderness. No facial edema.  Eyes: Pupils are equal, round, and reactive to light. Right eye exhibits no discharge. Left eye exhibits no discharge.  Neck: Normal range of motion. Neck supple.  Pulmonary/Chest: Effort normal. No respiratory distress.  Neurological: He is alert. Coordination normal.  Skin: He is not diaphoretic.  Psychiatric: He has a normal mood and affect. His behavior is normal.  Nursing note and vitals reviewed.    ED Treatments / Results  Labs (all labs ordered are listed, but only abnormal results are displayed) Labs Reviewed - No data to display  EKG None  Radiology No  results found.  Procedures Procedures (including critical care time)  Medications Ordered in ED Medications  oxyCODONE-acetaminophen (PERCOCET/ROXICET) 5-325 MG per tablet 1 tablet (has no administration in time range)     Initial Impression / Assessment and Plan / ED Course  I have reviewed the triage vital signs and the nursing notes.  Pertinent labs & imaging results that were available during my care of the patient were reviewed by me and considered in my medical decision making (see chart for details).     Patient with toothache.  No gross abscess.  Exam unconcerning for Ludwig's angina or spread of infection.  Will treat with antibiotic (clindamycin given his penicillin allergy), anti-inflammatories and short course of tramadol.  Urged patient to follow-up with dentist and have provided him with dental  resource guide.  His blood pressure was elevated in the ER today, counseled him to have this rechecked.  Discussed reasons to return to the emergency department and he agrees and voiced understanding to the above plan.  Final Clinical Impressions(s) / ED Diagnoses   Final diagnoses:  Pain, dental    ED Discharge Orders        Ordered    traMADol (ULTRAM) 50 MG tablet  Every 6 hours PRN     04/18/18 1942    clindamycin (CLEOCIN) 300 MG capsule  3 times daily     04/18/18 1944    naproxen (NAPROSYN) 500 MG tablet  2 times daily     04/18/18 1944       Glyn Ade, PA-C 04/18/18 1945    Drenda Freeze, MD 04/18/18 [phone]

## 2018-04-18 NOTE — ED Triage Notes (Signed)
Patient c/o right lower dental pain/chipped tooth. Patient started 5 days ago and getting progressively worse. Patient reports that he has been using dental numbing medication OTC with very little relief.

## 2018-05-30 ENCOUNTER — Other Ambulatory Visit: Payer: Self-pay | Admitting: Family Medicine

## 2018-05-30 DIAGNOSIS — E1065 Type 1 diabetes mellitus with hyperglycemia: Secondary | ICD-10-CM

## 2018-05-30 DIAGNOSIS — E1049 Type 1 diabetes mellitus with other diabetic neurological complication: Secondary | ICD-10-CM

## 2018-05-30 DIAGNOSIS — E109 Type 1 diabetes mellitus without complications: Secondary | ICD-10-CM

## 2018-05-31 MED ORDER — INSULIN LISPRO 100 UNIT/ML (KWIKPEN): 0.0000 [IU] | PEN_INJECTOR | Freq: Three times a day (TID) | SUBCUTANEOUS | 0 refills | Status: DC

## 2018-05-31 MED ORDER — INSULIN GLARGINE 100 UNIT/ML SOLOSTAR PEN: 55.0000 [IU] | PEN_INJECTOR | Freq: Every day | SUBCUTANEOUS | 0 refills | Status: DC

## 2018-07-04 ENCOUNTER — Other Ambulatory Visit: Payer: Self-pay | Admitting: Family Medicine

## 2018-07-04 DIAGNOSIS — E1065 Type 1 diabetes mellitus with hyperglycemia: Secondary | ICD-10-CM

## 2018-08-06 ENCOUNTER — Encounter: Payer: Self-pay | Admitting: Family Medicine

## 2018-08-06 ENCOUNTER — Ambulatory Visit: Payer: Managed Care, Other (non HMO) | Attending: Family Medicine | Admitting: Family Medicine

## 2018-08-06 ENCOUNTER — Other Ambulatory Visit: Payer: Self-pay

## 2018-08-06 VITALS — BP 122/81 | HR 82 | Temp 98.2°F | Resp 18 | Ht 74.0 in | Wt 274.2 lb

## 2018-08-06 DIAGNOSIS — I10 Essential (primary) hypertension: Secondary | ICD-10-CM | POA: Diagnosis not present

## 2018-08-06 DIAGNOSIS — E1065 Type 1 diabetes mellitus with hyperglycemia: Secondary | ICD-10-CM

## 2018-08-06 DIAGNOSIS — E7800 Pure hypercholesterolemia, unspecified: Secondary | ICD-10-CM | POA: Insufficient documentation

## 2018-08-06 DIAGNOSIS — E1049 Type 1 diabetes mellitus with other diabetic neurological complication: Secondary | ICD-10-CM

## 2018-08-06 DIAGNOSIS — Z880 Allergy status to penicillin: Secondary | ICD-10-CM | POA: Insufficient documentation

## 2018-08-06 DIAGNOSIS — Z91013 Allergy to seafood: Secondary | ICD-10-CM | POA: Insufficient documentation

## 2018-08-06 DIAGNOSIS — Z794 Long term (current) use of insulin: Secondary | ICD-10-CM | POA: Insufficient documentation

## 2018-08-06 DIAGNOSIS — K219 Gastro-esophageal reflux disease without esophagitis: Secondary | ICD-10-CM | POA: Diagnosis not present

## 2018-08-06 DIAGNOSIS — J45909 Unspecified asthma, uncomplicated: Secondary | ICD-10-CM | POA: Insufficient documentation

## 2018-08-06 DIAGNOSIS — Z09 Encounter for follow-up examination after completed treatment for conditions other than malignant neoplasm: Secondary | ICD-10-CM | POA: Insufficient documentation

## 2018-08-06 LAB — POCT GLYCOSYLATED HEMOGLOBIN (HGB A1C)
HBA1C, POC (PREDIABETIC RANGE): 10.6 % — AB (ref 5.7–6.4)
HEMOGLOBIN A1C: 10.6 % (ref 4.0–5.6)
HEMOGLOBIN A1C: 10.6 % — AB (ref 4.0–5.6)
HbA1c, POC (controlled diabetic range): 10.6 % — AB (ref 0.0–7.0)

## 2018-08-06 LAB — GLUCOSE, POCT (MANUAL RESULT ENTRY): POC Glucose: 382 mg/dl — AB (ref 70–99)

## 2018-08-06 MED ORDER — GLUCOSE BLOOD VI STRP: 1.0000 | ORAL_STRIP | Freq: Two times a day (BID) | 12 refills | Status: DC

## 2018-08-06 MED ORDER — INSULIN ASPART 100 UNIT/ML ~~LOC~~ SOLN
10.0000 [IU] | Freq: Once | SUBCUTANEOUS | Status: AC
  Administered 2018-08-06: 10 [IU] via SUBCUTANEOUS

## 2018-08-06 MED ORDER — OMEPRAZOLE 20 MG PO CPDR: 20.0000 mg | DELAYED_RELEASE_CAPSULE | Freq: Every day | ORAL | 3 refills | Status: DC

## 2018-08-06 MED ORDER — NAPROXEN 500 MG PO TABS: 500.0000 mg | ORAL_TABLET | Freq: Two times a day (BID) | ORAL | 1 refills | Status: DC

## 2018-08-06 MED ORDER — INSULIN GLARGINE 100 UNIT/ML SOLOSTAR PEN: 30.0000 [IU] | PEN_INJECTOR | Freq: Two times a day (BID) | SUBCUTANEOUS | 3 refills | Status: DC

## 2018-08-06 MED ORDER — GABAPENTIN 300 MG PO CAPS: 300.0000 mg | ORAL_CAPSULE | Freq: Two times a day (BID) | ORAL | 3 refills | Status: DC

## 2018-08-06 MED ORDER — ONETOUCH ULTRASOFT LANCETS MISC: 12 refills | Status: DC

## 2018-08-06 MED ORDER — ATORVASTATIN CALCIUM 10 MG PO TABS: 10.0000 mg | ORAL_TABLET | Freq: Every day | ORAL | 3 refills | Status: DC

## 2018-08-06 MED ORDER — INSULIN LISPRO 100 UNIT/ML (KWIKPEN): 0.0000 [IU] | PEN_INJECTOR | Freq: Three times a day (TID) | SUBCUTANEOUS | 1 refills | Status: DC

## 2018-08-06 MED ORDER — LISINOPRIL 10 MG PO TABS: 10.0000 mg | ORAL_TABLET | Freq: Every day | ORAL | 3 refills | Status: DC

## 2018-08-06 MED ORDER — ALBUTEROL SULFATE HFA 108 (90 BASE) MCG/ACT IN AERS: 2.0000 | INHALATION_SPRAY | RESPIRATORY_TRACT | 2 refills | Status: DC | PRN

## 2018-08-06 NOTE — Progress Notes (Signed)
Flu shot: yes  Pain: 0  Strips, lancets, naproxen  refill  And insulin pen needles also  Ext bs: 348

## 2018-08-06 NOTE — Progress Notes (Signed)
Subjective:  Patient ID: Christopher Douglas, male    DOB: Jan 13, 1991  Age: 27 y.o. MRN: [id number]  CC: Follow-up   HPI Christopher Douglas  is a 27 year old male with history of type 1 diabetes mellitus (A1c 10.6), hypertension, hyperlipidemia who comes to the clinic for a follow-up visit; last office visit was 8 months ago. His A1c is 10.6 which is down from 13.1previous and he has been taking 50 rather than 55 units of Lantus and  Sugars have been running in the 200-300 range; he has been out of Novolog for the last 3 days. He has no visual concerns but is yet to see an Ophthalmologist and his  Neuropathy is controlled. He would like to see Podiatry due to intermittent R foot pain from frequently climbing ladders as he previously had ankle surgery. He does not exercise regularly and compliance with a diabetic  Diet cannot be ascertained.  Past Medical History:  Diagnosis Date  . Asthma   . Diabetes mellitus without complication ([place])   . Hypertension     Past Surgical History:  Procedure Laterality Date  . FOOT SURGERY      Allergies  Allergen Reactions  . Amoxicillin Hives    Breaks out in hives  . Shellfish Allergy Hives    Specifically shrimp unknown     Outpatient Medications Prior to Visit  Medication Sig Dispense Refill  . Blood Glucose Monitoring Suppl (CONTOUR NEXT ONE) KIT 1 each by Does not apply route 3 (three) times daily before meals. 1 kit 12  . Blood Glucose Monitoring Suppl (ONE TOUCH ULTRA MINI) w/Device KIT 1 each by Does not apply route 3 (three) times daily. 1 each 0  . glucose blood (ONE TOUCH ULTRA TEST) test strip Use as instructed three times daily before meals 100 each 12  . ibuprofen (ADVIL,MOTRIN) 800 MG tablet Take 1 tablet (800 mg total) by mouth every 8 (eight) hours as needed. 21 tablet 0  . Insulin Pen Needle 31G X 5 MM MISC 1 each 4 (four) times daily -  with meals and at bedtime by Does not apply route. 100 each 0  . albuterol (PROVENTIL  HFA;VENTOLIN HFA) 108 (90 Base) MCG/ACT inhaler Inhale 2 puffs into the lungs every 4 (four) hours as needed for wheezing or shortness of breath. 1 Inhaler 2  . atorvastatin (LIPITOR) 10 MG tablet Take 1 tablet (10 mg total) by mouth daily. 30 tablet 3  . gabapentin (NEURONTIN) 300 MG capsule Take 1 capsule (300 mg total) by mouth 2 (two) times daily. 60 capsule 3  . glucose blood (ONETOUCH VERIO) test strip 1 each by Other route 2 (two) times daily. And lancets 2/day 100 each 12  . insulin lispro (HUMALOG KWIKPEN) 100 UNIT/ML KiwkPen Inject 0-0.12 mLs (0-12 Units total) into the skin 3 (three) times daily. As per sliding scale. MUST MAKE APPT FOR FURTHER REFILLS 12 mL 0  . Lancets (ONETOUCH ULTRASOFT) lancets Use as instructed 3 times daily before meals 100 each 12  . LANTUS SOLOSTAR 100 UNIT/ML Solostar Pen INJECT 55 UNITS UNDER THE SKIN ONCE DAILY AT 10 PM MUST MAKE APPT FOR REFILLS 15 mL 0  . lisinopril (PRINIVIL,ZESTRIL) 10 MG tablet Take 1 tablet (10 mg total) by mouth daily. 30 tablet 3  . naproxen (NAPROSYN) 500 MG tablet Take 1 tablet (500 mg total) by mouth 2 (two) times daily. 30 tablet 0  . omeprazole (PRILOSEC) 20 MG capsule Take 1 capsule (20 mg total) by mouth  daily. 30 capsule 3  . clindamycin (CLEOCIN) 300 MG capsule Take 1 capsule (300 mg total) by mouth 3 (three) times daily. (Patient not taking: Reported on 08/06/2018) 21 capsule 0  . traMADol (ULTRAM) 50 MG tablet Take 1 tablet (50 mg total) by mouth every 6 (six) hours as needed. (Patient not taking: Reported on 08/06/2018) 20 tablet 0   No facility-administered medications prior to visit.     ROS Review of Systems  Constitutional: Negative for activity change and appetite change.  HENT: Negative for sinus pressure and sore throat.   Eyes: Negative for visual disturbance.  Respiratory: Negative for cough, chest tightness and shortness of breath.   Cardiovascular: Negative for chest pain and leg swelling.  Gastrointestinal:  Negative for abdominal distention, abdominal pain, constipation and diarrhea.  Endocrine: Negative.   Genitourinary: Negative for dysuria.  Musculoskeletal: Negative for joint swelling and myalgias.  Skin: Negative for rash.  Allergic/Immunologic: Negative.   Neurological: Negative for weakness, light-headedness and numbness.  Psychiatric/Behavioral: Negative for dysphoric mood and suicidal ideas.    Objective:  BP 122/81   Pulse 82   Temp 98.2 F (36.8 C) (Oral)   Resp 18   Ht '6\' 2"'  (1.88 m)   Wt 274 lb 3.2 oz (124.4 kg)   SpO2 95%   BMI 35.21 kg/m   BP/Weight 08/06/2018 8/93/8101 05/25/1024  Systolic BP 852 778 242  Diastolic BP 81 94 71  Wt. (Lbs) 274.2 260 262.2  BMI 35.21 33.38 32.77      Physical Exam  Constitutional: He is oriented to person, place, and time. He appears well-developed and well-nourished.  Cardiovascular: Normal rate, normal heart sounds and intact distal pulses.  No murmur heard. Pulmonary/Chest: Effort normal and breath sounds normal. He has no wheezes. He has no rales. He exhibits no tenderness.  Abdominal: Soft. Bowel sounds are normal. He exhibits no distension and no mass. There is no tenderness.  Musculoskeletal: Normal range of motion.  Neurological: He is alert and oriented to person, place, and time.  Skin: Skin is warm and dry.  Psychiatric: He has a normal mood and affect.    CMP Latest Ref Rng & Units 07/03/2017 04/13/2017 03/25/2016  Glucose 65 - 99 mg/dL 589(HH) 310(H) 243(H)  BUN 6 - 20 mg/dL '8 9 6  ' Creatinine 0.61 - 1.24 mg/dL 1.28(H) 1.14 0.98  Sodium 135 - 145 mmol/L 130(L) 133(L) 136  Potassium 3.5 - 5.1 mmol/L 4.4 3.9 3.9  Chloride 101 - 111 mmol/L 96(L) 98(L) 100(L)  CO2 22 - 32 mmol/L '22 26 25  ' Calcium 8.9 - 10.3 mg/dL 9.3 9.2 9.3  Total Protein 6.0 - 8.3 g/dL - - -  Total Bilirubin 0.2 - 1.2 mg/dL - - -  Alkaline Phos 39 - 117 U/L - - -  AST 0 - 37 U/L - - -  ALT 0 - 53 U/L - - -    Lipid Panel     Component Value  Date/Time   CHOL 216 (H) 09/16/2014 1546   TRIG 124.0 09/16/2014 1546   HDL 55.50 09/16/2014 1546   CHOLHDL 4 09/16/2014 1546   VLDL 24.8 09/16/2014 1546   LDLCALC 136 (H) 09/16/2014 1546    Lab Results  Component Value Date   HGBA1C 10.6 (A) 08/06/2018   HGBA1C 10.6 08/06/2018   HGBA1C 10.6 (A) 08/06/2018   HGBA1C 10.6 (A) 08/06/2018    Assessment & Plan:   1. Type 1 diabetes mellitus with hyperglycemia (HCC) Uncontrolled with A1c of 10.6  Increased Lantus dose Counseled on Diabetic diet, my plate method, 545 minutes of moderate intensity exercise/week Keep blood sugar logs with fasting goals of 80-120 mg/dl, random of less than 180 and in the event of sugars less than 60 mg/dl or greater than 400 mg/dl please notify the clinic ASAP. It is recommended that you undergo annual eye exams and annual foot exams. Pneumonia vaccine is recommended. - Glucose (CBG) - HgB A1c - insulin aspart (novoLOG) injection 10 Units - Insulin Glargine (LANTUS SOLOSTAR) 100 UNIT/ML Solostar Pen; Inject 30 Units into the skin 2 (two) times daily.  Dispense: 30 mL; Refill: 3 - Ambulatory referral to Ophthalmology - Ambulatory referral to [place]; Future - Lipid panel; Future - Microalbumin/Creatinine Ratio, Urine; Future - Lancets (ONETOUCH ULTRASOFT) lancets; Use as instructed 3 times daily before meals  Dispense: 100 each; Refill: 12  2. 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 As per sliding scale. MUST MAKE APPT FOR FURTHER REFILLS  Dispense: 30 mL; Refill: 1 - gabapentin (NEURONTIN) 300 MG capsule; Take 1 capsule (300 mg total) by mouth 2 (two) times daily.  Dispense: 60 capsule; Refill: 3  3. Gastroesophageal reflux disease without esophagitis Controlled - omeprazole (PRILOSEC) 20 MG capsule; Take  1 capsule (20 mg total) by mouth daily.  Dispense: 30 capsule; Refill: 3  4. Essential hypertension Controlled Counseled on blood pressure goal of less than 130/80, low-sodium, DASH diet, medication compliance, 150 minutes of moderate intensity exercise per week. Discussed medication compliance, adverse effects. - lisinopril (PRINIVIL,ZESTRIL) 10 MG tablet; Take 1 tablet (10 mg total) by mouth daily.  Dispense: 30 tablet; Refill: 3  5. Pure hypercholesterolemia Uncontrolled Low cholesterol diet - atorvastatin (LIPITOR) 10 MG tablet; Take 1 tablet (10 mg total) by mouth daily.  Dispense: 30 tablet; Refill: 3   Meds ordered this encounter  Medications  . insulin aspart (novoLOG) injection 10 Units  . Insulin Glargine (LANTUS SOLOSTAR) 100 UNIT/ML Solostar Pen    Sig: Inject 30 Units into the skin 2 (two) times daily.    Dispense:  30 mL    Refill:  3    Discontinue previous dose  . naproxen (NAPROSYN) 500 MG tablet    Sig: Take 1 tablet (500 mg total) by mouth 2 (two) times daily.    Dispense:  30 tablet    Refill:  1  . Lancets (ONETOUCH ULTRASOFT) lancets    Sig: Use as instructed 3 times daily before meals    Dispense:  100 each    Refill:  12  . insulin lispro (HUMALOG KWIKPEN) 100 UNIT/ML KiwkPen    Sig: Inject 0-0.12 mLs (0-12 Units total) into the skin 3 (three) times daily. As per sliding scale. MUST MAKE APPT FOR FURTHER REFILLS    Dispense:  30 mL    Refill:  1  . glucose blood (ONETOUCH VERIO) test strip    Sig: 1 each by Other route 2 (two) times daily. And lancets 2/day    Dispense:  100 each    Refill:  12  . omeprazole (PRILOSEC) 20 MG capsule    Sig: Take 1 capsule (20 mg total) by mouth daily.    Dispense:  30 capsule    Refill:  3  . lisinopril (PRINIVIL,ZESTRIL) 10 MG tablet    Sig: Take 1 tablet (10 mg total) by mouth daily.  Dispense:  30 tablet    Refill:  3  . gabapentin (NEURONTIN) 300 MG capsule    Sig: Take 1 capsule (300 mg total) by mouth 2  (two) times daily.    Dispense:  60 capsule    Refill:  3  . atorvastatin (LIPITOR) 10 MG tablet    Sig: Take 1 tablet (10 mg total) by mouth daily.    Dispense:  30 tablet    Refill:  3  . albuterol (PROVENTIL HFA;VENTOLIN HFA) 108 (90 Base) MCG/ACT inhaler    Sig: Inhale 2 puffs into the lungs every 4 (four) hours as needed for wheezing or shortness of breath.    Dispense:  1 Inhaler    Refill:  2    Follow-up: Return in about 3 months (around 11/05/2018) for follow up of chronic medical conditions.   Charlott Rakes MD

## 2018-08-07 ENCOUNTER — Encounter: Payer: Self-pay | Admitting: Family Medicine

## 2018-08-13 ENCOUNTER — Telehealth: Payer: Self-pay | Admitting: Family Medicine

## 2018-08-13 DIAGNOSIS — E1065 Type 1 diabetes mellitus with hyperglycemia: Secondary | ICD-10-CM

## 2018-08-13 MED ORDER — GLUCOSE BLOOD VI STRP: ORAL_STRIP | 12 refills | Status: DC

## 2018-08-13 NOTE — Telephone Encounter (Signed)
Patient came in stating that glucose blood (ONETOUCH VERIO) test strip [[id number] Were the wrong test strips for his glucose machine, patient needs glucose blood (ONE TOUCH ULTRA TEST) test strip [[id number]][[id number]] [id number] - [place], [place] - [address] AT [facility] [address] Patient uses

## 2019-01-03 IMAGING — DX DG CHEST 1V PORT
1 series · 1 of 1 positions shown · non-contrast
Comparison: 03/25/2016

CLINICAL DATA: Cough, shortness of breath, and asthma tonight.

EXAM:
PORTABLE CHEST 1 VIEW

[chest ap]
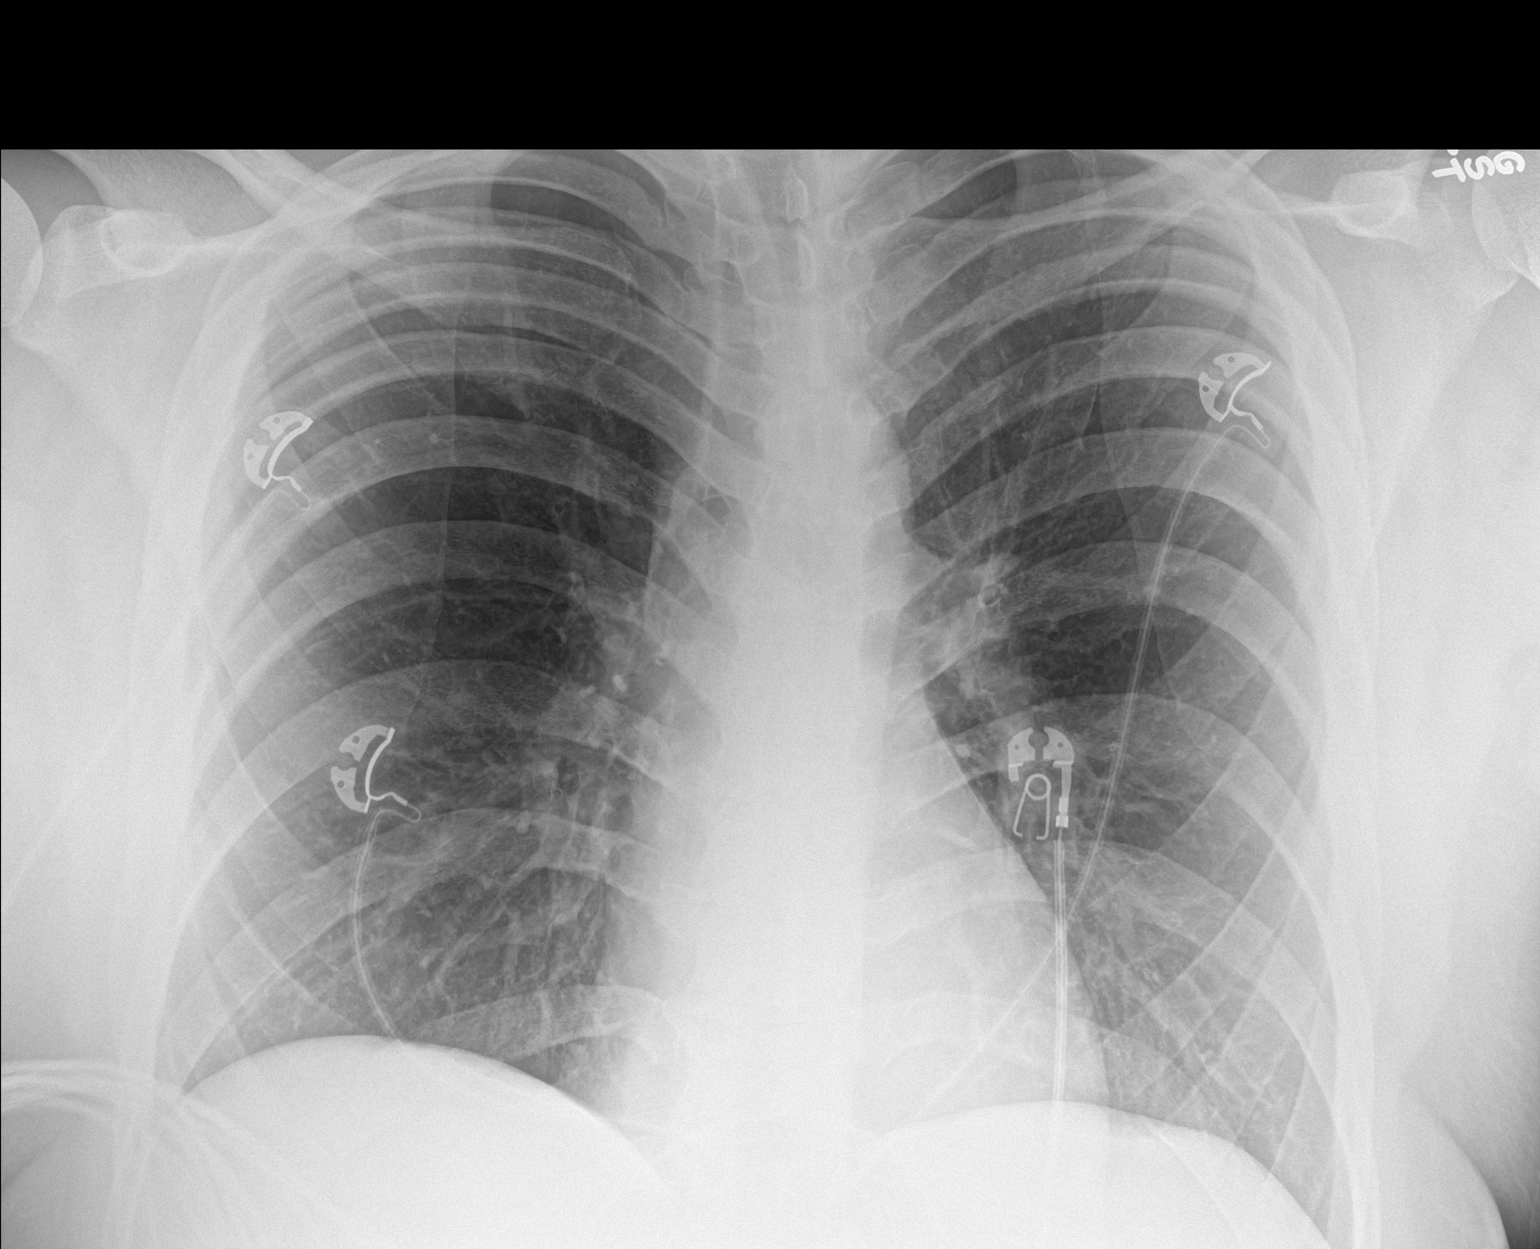

[1 of 1 positions shown; findings below may reference images not displayed]

FINDINGS: Hyperinflation of the lungs compatible with history of asthma.
Normal heart size and pulmonary vascularity. No focal airspace
disease or consolidation in the lungs. No blunting of costophrenic
angles. No pneumothorax. Mediastinal contours appear intact.
IMPRESSION: Pulmonary hyperinflation.  No consolidation or edema.

## 2019-01-28 DIAGNOSIS — M9903 Segmental and somatic dysfunction of lumbar region: Secondary | ICD-10-CM | POA: Diagnosis not present

## 2019-01-28 DIAGNOSIS — M5386 Other specified dorsopathies, lumbar region: Secondary | ICD-10-CM | POA: Diagnosis not present

## 2019-01-28 DIAGNOSIS — M5136 Other intervertebral disc degeneration, lumbar region: Secondary | ICD-10-CM | POA: Diagnosis not present

## 2019-01-28 DIAGNOSIS — M9905 Segmental and somatic dysfunction of pelvic region: Secondary | ICD-10-CM | POA: Diagnosis not present

## 2019-02-04 DIAGNOSIS — M9905 Segmental and somatic dysfunction of pelvic region: Secondary | ICD-10-CM | POA: Diagnosis not present

## 2019-02-04 DIAGNOSIS — M9903 Segmental and somatic dysfunction of lumbar region: Secondary | ICD-10-CM | POA: Diagnosis not present

## 2019-02-04 DIAGNOSIS — M5136 Other intervertebral disc degeneration, lumbar region: Secondary | ICD-10-CM | POA: Diagnosis not present

## 2019-02-04 DIAGNOSIS — M5386 Other specified dorsopathies, lumbar region: Secondary | ICD-10-CM | POA: Diagnosis not present

## 2019-02-06 DIAGNOSIS — M9905 Segmental and somatic dysfunction of pelvic region: Secondary | ICD-10-CM | POA: Diagnosis not present

## 2019-02-06 DIAGNOSIS — M9903 Segmental and somatic dysfunction of lumbar region: Secondary | ICD-10-CM | POA: Diagnosis not present

## 2019-02-06 DIAGNOSIS — M5136 Other intervertebral disc degeneration, lumbar region: Secondary | ICD-10-CM | POA: Diagnosis not present

## 2019-02-06 DIAGNOSIS — M5386 Other specified dorsopathies, lumbar region: Secondary | ICD-10-CM | POA: Diagnosis not present

## 2019-02-18 DIAGNOSIS — M5136 Other intervertebral disc degeneration, lumbar region: Secondary | ICD-10-CM | POA: Diagnosis not present

## 2019-02-18 DIAGNOSIS — M9903 Segmental and somatic dysfunction of lumbar region: Secondary | ICD-10-CM | POA: Diagnosis not present

## 2019-02-18 DIAGNOSIS — M5386 Other specified dorsopathies, lumbar region: Secondary | ICD-10-CM | POA: Diagnosis not present

## 2019-02-18 DIAGNOSIS — M9905 Segmental and somatic dysfunction of pelvic region: Secondary | ICD-10-CM | POA: Diagnosis not present

## 2019-02-21 DIAGNOSIS — M5386 Other specified dorsopathies, lumbar region: Secondary | ICD-10-CM | POA: Diagnosis not present

## 2019-02-21 DIAGNOSIS — M5136 Other intervertebral disc degeneration, lumbar region: Secondary | ICD-10-CM | POA: Diagnosis not present

## 2019-02-21 DIAGNOSIS — M9903 Segmental and somatic dysfunction of lumbar region: Secondary | ICD-10-CM | POA: Diagnosis not present

## 2019-02-21 DIAGNOSIS — M9905 Segmental and somatic dysfunction of pelvic region: Secondary | ICD-10-CM | POA: Diagnosis not present

## 2019-03-01 DIAGNOSIS — M5136 Other intervertebral disc degeneration, lumbar region: Secondary | ICD-10-CM | POA: Diagnosis not present

## 2019-03-01 DIAGNOSIS — M9903 Segmental and somatic dysfunction of lumbar region: Secondary | ICD-10-CM | POA: Diagnosis not present

## 2019-03-01 DIAGNOSIS — M5386 Other specified dorsopathies, lumbar region: Secondary | ICD-10-CM | POA: Diagnosis not present

## 2019-03-01 DIAGNOSIS — M9905 Segmental and somatic dysfunction of pelvic region: Secondary | ICD-10-CM | POA: Diagnosis not present

## 2019-03-04 DIAGNOSIS — M5386 Other specified dorsopathies, lumbar region: Secondary | ICD-10-CM | POA: Diagnosis not present

## 2019-03-04 DIAGNOSIS — M9905 Segmental and somatic dysfunction of pelvic region: Secondary | ICD-10-CM | POA: Diagnosis not present

## 2019-03-04 DIAGNOSIS — M5136 Other intervertebral disc degeneration, lumbar region: Secondary | ICD-10-CM | POA: Diagnosis not present

## 2019-03-04 DIAGNOSIS — M9903 Segmental and somatic dysfunction of lumbar region: Secondary | ICD-10-CM | POA: Diagnosis not present

## 2019-03-07 DIAGNOSIS — M5136 Other intervertebral disc degeneration, lumbar region: Secondary | ICD-10-CM | POA: Diagnosis not present

## 2019-03-07 DIAGNOSIS — M9903 Segmental and somatic dysfunction of lumbar region: Secondary | ICD-10-CM | POA: Diagnosis not present

## 2019-03-07 DIAGNOSIS — M9905 Segmental and somatic dysfunction of pelvic region: Secondary | ICD-10-CM | POA: Diagnosis not present

## 2019-03-07 DIAGNOSIS — M5386 Other specified dorsopathies, lumbar region: Secondary | ICD-10-CM | POA: Diagnosis not present

## 2019-04-05 ENCOUNTER — Ambulatory Visit (HOSPITAL_COMMUNITY)
Admission: EM | Admit: 2019-04-05 | Discharge: 2019-04-05 | Disposition: A | Discharge status: Home / Self Care | Payer: Managed Care, Other (non HMO) | Attending: Family Medicine | Admitting: Family Medicine

## 2019-04-05 ENCOUNTER — Other Ambulatory Visit: Payer: Self-pay

## 2019-04-05 ENCOUNTER — Encounter (HOSPITAL_COMMUNITY): Payer: Self-pay

## 2019-04-05 ENCOUNTER — Ambulatory Visit (INDEPENDENT_AMBULATORY_CARE_PROVIDER_SITE_OTHER): Payer: Managed Care, Other (non HMO)

## 2019-04-05 DIAGNOSIS — R52 Pain, unspecified: Secondary | ICD-10-CM | POA: Diagnosis not present

## 2019-04-05 DIAGNOSIS — S76019A Strain of muscle, fascia and tendon of unspecified hip, initial encounter: Secondary | ICD-10-CM | POA: Diagnosis not present

## 2019-04-05 MED ORDER — IBUPROFEN 800 MG PO TABS: 800.0000 mg | ORAL_TABLET | Freq: Three times a day (TID) | ORAL | 0 refills | Status: AC

## 2019-04-05 MED ORDER — TIZANIDINE HCL 4 MG PO TABS: 4.0000 mg | ORAL_TABLET | Freq: Four times a day (QID) | ORAL | 0 refills | Status: DC | PRN

## 2019-04-05 NOTE — Discharge Instructions (Addendum)
I think you have a muscle strain. Rest the area, avoid activities that increase your pain Put ice on the area for 20 minutes every few hours Take ibuprofen 3 times a day with food Take the tizanidine, muscle relaxer, as needed.  This is useful at bedtime Call your doctor if not improving by next week

## 2019-04-05 NOTE — ED Provider Notes (Signed)
[place]    CSN: [id number] Arrival date & time: 04/05/19  1525     History   Chief Complaint Chief Complaint  Patient presents with  . Knee Pain    HPI Christopher Douglas is a 28 y.o. male.   HPI  Patient is having pain in his left hip that radiates down to the left knee.  This is been going on since yesterday.  He was doing some stretches where he was sitting "[place] style" on the floor and pushing down on his knees.  He states he felt a pop.  When he stood up he had a severe pain from the hip down to the knee.  It has persisted.  When he puts his full weight on the left leg it feels like it will buckle.  No numbness or weakness.  No prior hip problems previously.  He does have low back problems.  He sees a Restaurant manager, fast food.  He did not have any trauma, overuse, or other mechanism of injury. Poorly controlled diabetic with A1cs over 10. VSS  Past Medical History:  Diagnosis Date  . Asthma   . Diabetes mellitus without complication ([place])   . Hypertension     Patient Active Problem List   Diagnosis Date Noted  . GERD (gastroesophageal reflux disease) 05/12/2017  . Hyperlipidemia 04/06/2016  . Hypertension 06/29/2015  . Diabetic neuropathy ([place]) 09/16/2014  . DM type 1 (diabetes mellitus, type 1) ([place]) 01/04/2013  . Unspecified constipation 01/04/2013  . Asthma 01/03/2013    Past Surgical History:  Procedure Laterality Date  . FOOT SURGERY         Home Medications    Prior to Admission medications   Medication Sig Start Date End Date Taking? Authorizing Provider  albuterol (PROVENTIL HFA;VENTOLIN HFA) 108 (90 Base) MCG/ACT inhaler Inhale 2 puffs into the lungs every 4 (four) hours as needed for wheezing or shortness of breath. 08/06/18   Charlott Rakes, MD  atorvastatin (LIPITOR) 10 MG tablet Take 1 tablet (10 mg total) by mouth daily. 08/06/18   Charlott Rakes, MD  Blood Glucose Monitoring Suppl (ONE TOUCH ULTRA MINI) w/Device KIT 1 each by Does not apply  route 3 (three) times daily. 11/24/17   Charlott Rakes, MD  gabapentin (NEURONTIN) 300 MG capsule Take 1 capsule (300 mg total) by mouth 2 (two) times daily. 08/06/18   Charlott Rakes, MD  glucose blood (ONE TOUCH ULTRA TEST) test strip Use as instructed three times daily before meals 08/13/18   Charlott Rakes, MD  ibuprofen (ADVIL) 800 MG tablet Take 1 tablet (800 mg total) by mouth 3 (three) times daily. 04/05/19   Raylene Everts, MD  Insulin Glargine (LANTUS SOLOSTAR) 100 UNIT/ML Solostar Pen Inject 30 Units into the skin 2 (two) times daily. 08/06/18   Charlott Rakes, MD  insulin lispro (HUMALOG KWIKPEN) 100 UNIT/ML KiwkPen Inject 0-0.12 mLs (0-12 Units total) into the skin 3 (three) times daily. As per sliding scale. MUST MAKE APPT FOR FURTHER REFILLS 08/06/18   Charlott Rakes, MD  Insulin Pen Needle 31G X 5 MM MISC 1 each 4 (four) times daily -  with meals and at bedtime by Does not apply route. 10/06/17   Charlott Rakes, MD  Lancets (ONETOUCH ULTRASOFT) lancets Use as instructed 3 times daily before meals 08/06/18   Charlott Rakes, MD  lisinopril (PRINIVIL,ZESTRIL) 10 MG tablet Take 1 tablet (10 mg total) by mouth daily. 08/06/18   Charlott Rakes, MD  omeprazole (PRILOSEC) 20 MG capsule Take 1  capsule (20 mg total) by mouth daily. 08/06/18   Charlott Rakes, MD  tiZANidine (ZANAFLEX) 4 MG tablet Take 1-2 tablets (4-8 mg total) by mouth every 6 (six) hours as needed for muscle spasms. 04/05/19   Raylene Everts, MD    Family History Family History  Problem Relation Age of Onset  . Diabetes Mellitus II Father   . Hypertension Father   . Alcohol abuse Sister   . Alcohol abuse Brother   . Diabetes Maternal Grandmother   . Diabetes Maternal Grandfather   . Hypertension Maternal Grandfather   . Hypertension Mother     Social History Social History   Tobacco Use  . Smoking status: Current Every Day Smoker    Packs/day: 0.15    Years: 5.00    Pack years: 0.75    Types: Cigarettes   . Smokeless tobacco: Never Used  Substance Use Topics  . Alcohol use: No  . Drug use: No     Allergies   Amoxicillin and Shellfish allergy   Review of Systems Review of Systems  Constitutional: Negative for chills and fever.  HENT: Negative for ear pain and sore throat.   Eyes: Negative for pain and visual disturbance.  Respiratory: Negative for cough and shortness of breath.   Cardiovascular: Negative for chest pain and palpitations.  Gastrointestinal: Negative for abdominal pain and vomiting.  Genitourinary: Negative for dysuria and hematuria.  Musculoskeletal: Positive for arthralgias and gait problem. Negative for back pain.  Skin: Negative for color change and rash.  Neurological: Negative for seizures and syncope.  All other systems reviewed and are negative.    Physical Exam Triage Vital Signs ED Triage Vitals [04/05/19 1542]  Enc Vitals Group     BP (!) 151/83     Pulse Rate 98     Resp 18     Temp 98.1 F (36.7 C)     Temp Source Oral     SpO2 100 %     Weight      Height      Head Circumference      Peak Flow      Pain Score 7     Pain Loc      Pain Edu?      Excl. in [place]?    No data found.  Updated Vital Signs BP (!) 151/83 (BP Location: Right Arm)   Pulse 98   Temp 98.1 F (36.7 C) (Oral)   Resp 18   SpO2 100%       Physical Exam Constitutional:      General: He is not in acute distress.    Appearance: He is well-developed. He is obese.     Comments: Antalgic gait, moderately overweight  HENT:     Head: Normocephalic and atraumatic.  Eyes:     Conjunctiva/sclera: Conjunctivae normal.     Pupils: Pupils are equal, round, and reactive to light.  Neck:     Musculoskeletal: Normal range of motion.  Cardiovascular:     Rate and Rhythm: Normal rate and regular rhythm.     Heart sounds: Normal heart sounds.  Pulmonary:     Effort: Pulmonary effort is normal. No respiratory distress.     Breath sounds: Normal breath sounds.  Abdominal:      General: There is no distension.     Palpations: Abdomen is soft.  Musculoskeletal: Normal range of motion.     Comments: Back is straight and nontender.  Patient can easily flex to fingertips just  above the floor.  Strength sensation range of motion reflexes are normal in both lower extremities.  Range of motion of left hip, however, is limited especially to external rotation.  Tenderness to palpation of the anterior hip into the hip greater trochanter on the left.  Skin:    General: Skin is warm and dry.  Neurological:     General: No focal deficit present.     Mental Status: He is alert.     Coordination: Coordination normal.     Gait: Gait abnormal.     Deep Tendon Reflexes: Reflexes normal.  Psychiatric:        Mood and Affect: Mood normal.      UC Treatments / Results  Labs (all labs ordered are listed, but only abnormal results are displayed) Labs Reviewed - No data to display  EKG None  Radiology Dg Hip Unilat With Pelvis 2-3 Views Left  Result Date: 04/05/2019 CLINICAL DATA:  Left hip pain for 1 day. Possible injury exercising. Initial encounter. EXAM: DG HIP (WITH OR WITHOUT PELVIS) 2-3V LEFT COMPARISON:  None. FINDINGS: There is no evidence of hip fracture or dislocation. There is no evidence of arthropathy or other focal bone abnormality. IMPRESSION: Normal exam. Electronically Signed   By: Inge Rise M.D.   On: 04/05/2019 16:43    Procedures Procedures (including critical care time)  Medications Ordered in UC Medications - No data to display  Initial Impression / Assessment and Plan / UC Course  I have reviewed the triage vital signs and the nursing notes.  Pertinent labs & imaging results that were available during my care of the patient were reviewed by me and considered in my medical decision making (see chart for details).    I explained to the patient that his knee pain is actually referred from his hip.  He has limited range of motion and  tenderness palpation of the hip joint with a normal knee exam.  His x-rays are negative.  I believe it is a muscular or ligamentous injury.  Rest ice and anti-inflammatories should give him improvement over a matter of days. Final Clinical Impressions(s) / UC Diagnoses   Final diagnoses:  Pain  Hip strain, initial encounter     Discharge Instructions     I think you have a muscle strain. Rest the area, avoid activities that increase your pain Put ice on the area for 20 minutes every few hours Take ibuprofen 3 times a day with food Take the tizanidine, muscle relaxer, as needed.  This is useful at bedtime Call your doctor if not improving by next week    ED Prescriptions    Medication Sig Dispense Auth. Provider   ibuprofen (ADVIL) 800 MG tablet Take 1 tablet (800 mg total) by mouth 3 (three) times daily. 21 tablet Raylene Everts, MD   tiZANidine (ZANAFLEX) 4 MG tablet Take 1-2 tablets (4-8 mg total) by mouth every 6 (six) hours as needed for muscle spasms. 21 tablet Raylene Everts, MD     Controlled Substance Prescriptions [place] Controlled Substance Registry consulted? Not Applicable   Raylene Everts, MD 04/05/19 [phone]

## 2019-04-05 NOTE — ED Triage Notes (Signed)
Pt c/o pain to lt knee shooting up to lt hip since yesterday morning doing stretches

## 2019-04-26 ENCOUNTER — Emergency Department (HOSPITAL_COMMUNITY): Payer: BC Managed Care – PPO

## 2019-04-26 ENCOUNTER — Emergency Department (HOSPITAL_COMMUNITY)
Admission: EM | Admit: 2019-04-26 | Discharge: 2019-04-26 | Disposition: A | Discharge status: Home / Self Care | Payer: BC Managed Care – PPO | Attending: Emergency Medicine | Admitting: Emergency Medicine

## 2019-04-26 ENCOUNTER — Other Ambulatory Visit: Payer: Self-pay

## 2019-04-26 DIAGNOSIS — Z79899 Other long term (current) drug therapy: Secondary | ICD-10-CM | POA: Insufficient documentation

## 2019-04-26 DIAGNOSIS — R5381 Other malaise: Secondary | ICD-10-CM | POA: Insufficient documentation

## 2019-04-26 DIAGNOSIS — I10 Essential (primary) hypertension: Secondary | ICD-10-CM | POA: Insufficient documentation

## 2019-04-26 DIAGNOSIS — E1040 Type 1 diabetes mellitus with diabetic neuropathy, unspecified: Secondary | ICD-10-CM | POA: Insufficient documentation

## 2019-04-26 DIAGNOSIS — Z20828 Contact with and (suspected) exposure to other viral communicable diseases: Secondary | ICD-10-CM | POA: Insufficient documentation

## 2019-04-26 DIAGNOSIS — R059 Cough, unspecified: Secondary | ICD-10-CM

## 2019-04-26 DIAGNOSIS — R42 Dizziness and giddiness: Secondary | ICD-10-CM | POA: Diagnosis not present

## 2019-04-26 DIAGNOSIS — F17210 Nicotine dependence, cigarettes, uncomplicated: Secondary | ICD-10-CM | POA: Diagnosis not present

## 2019-04-26 DIAGNOSIS — R739 Hyperglycemia, unspecified: Secondary | ICD-10-CM

## 2019-04-26 DIAGNOSIS — R0602 Shortness of breath: Secondary | ICD-10-CM | POA: Diagnosis not present

## 2019-04-26 DIAGNOSIS — Z794 Long term (current) use of insulin: Secondary | ICD-10-CM | POA: Insufficient documentation

## 2019-04-26 DIAGNOSIS — R062 Wheezing: Secondary | ICD-10-CM | POA: Insufficient documentation

## 2019-04-26 DIAGNOSIS — E1065 Type 1 diabetes mellitus with hyperglycemia: Secondary | ICD-10-CM | POA: Insufficient documentation

## 2019-04-26 DIAGNOSIS — R05 Cough: Secondary | ICD-10-CM | POA: Diagnosis not present

## 2019-04-26 LAB — CBG MONITORING, ED: Glucose-Capillary: 346 mg/dL — ABNORMAL HIGH (ref 70–99)

## 2019-04-26 NOTE — ED Provider Notes (Signed)
[facility] EMERGENCY DEPARTMENT Provider Note   CSN: [id number] Arrival date & time: 04/26/19  3785    History   Chief Complaint Chief Complaint  Patient presents with  . Cough    HPI Christopher Douglas is a 28 y.o. male who presents with a cough.  Past medical history significant for asthma and insulin-dependent diabetes.  He states that he started to feel unwell last Thursday with generalized malaise. On Friday he started to have a cough.  He says it is dry for the most part however sometimes is productive of small amount of mucus.  He reports associated mild shortness of breath and wheezing.  He has been using his inhaler more often.  He works for [employer] and goes into multiple peoples homes.  He was on a ladder yesterday and it was very hot and he became dizzy.  This prompted him to decide to come to the emergency department today.  He denies true fever - had a T-max of 99.7.  His blood sugars have been labile mostly running very high.  He reports polyuria and has not increase his fluid intake.  He denies any chest pain, abdominal pain.     HPI  Past Medical History:  Diagnosis Date  . Asthma   . Diabetes mellitus without complication ([place])   . Hypertension     Patient Active Problem List   Diagnosis Date Noted  . GERD (gastroesophageal reflux disease) 05/12/2017  . Hyperlipidemia 04/06/2016  . Hypertension 06/29/2015  . Diabetic neuropathy ([place]) 09/16/2014  . DM type 1 (diabetes mellitus, type 1) ([place]) 01/04/2013  . Unspecified constipation 01/04/2013  . Asthma 01/03/2013    Past Surgical History:  Procedure Laterality Date  . FOOT SURGERY          Home Medications    Prior to Admission medications   Medication Sig Start Date End Date Taking? Authorizing Provider  albuterol (PROVENTIL HFA;VENTOLIN HFA) 108 (90 Base) MCG/ACT inhaler Inhale 2 puffs into the lungs every 4 (four) hours as needed for wheezing or shortness of breath. 08/06/18  Yes  Charlott Rakes, MD  atorvastatin (LIPITOR) 10 MG tablet Take 1 tablet (10 mg total) by mouth daily. 08/06/18  Yes Charlott Rakes, MD  Blood Glucose Monitoring Suppl (ONE TOUCH ULTRA MINI) w/Device KIT 1 each by Does not apply route 3 (three) times daily. 11/24/17  Yes Charlott Rakes, MD  gabapentin (NEURONTIN) 300 MG capsule Take 1 capsule (300 mg total) by mouth 2 (two) times daily. 08/06/18  Yes Charlott Rakes, MD  glucose blood (ONE TOUCH ULTRA TEST) test strip Use as instructed three times daily before meals 08/13/18  Yes Newlin, Enobong, MD  ibuprofen (ADVIL) 800 MG tablet Take 1 tablet (800 mg total) by mouth 3 (three) times daily. 04/05/19  Yes Raylene Everts, MD  Insulin Glargine (LANTUS SOLOSTAR) 100 UNIT/ML Solostar Pen Inject 30 Units into the skin 2 (two) times daily. 08/06/18  Yes Newlin, Enobong, MD  insulin lispro (HUMALOG KWIKPEN) 100 UNIT/ML KiwkPen Inject 0-0.12 mLs (0-12 Units total) into the skin 3 (three) times daily. As per sliding scale. MUST MAKE APPT FOR FURTHER REFILLS 08/06/18  Yes Newlin, Enobong, MD  Insulin Pen Needle 31G X 5 MM MISC 1 each 4 (four) times daily -  with meals and at bedtime by Does not apply route. 10/06/17  Yes Charlott Rakes, MD  Lancets (ONETOUCH ULTRASOFT) lancets Use as instructed 3 times daily before meals 08/06/18  Yes Charlott Rakes, MD  lisinopril (  PRINIVIL,ZESTRIL) 10 MG tablet Take 1 tablet (10 mg total) by mouth daily. 08/06/18  Yes Charlott Rakes, MD  omeprazole (PRILOSEC) 20 MG capsule Take 1 capsule (20 mg total) by mouth daily. Patient taking differently: Take 20 mg by mouth daily as needed (acid reflux).  08/06/18  Yes Charlott Rakes, MD  tiZANidine (ZANAFLEX) 4 MG tablet Take 1-2 tablets (4-8 mg total) by mouth every 6 (six) hours as needed for muscle spasms. 04/05/19  Yes Raylene Everts, MD    Family History Family History  Problem Relation Age of Onset  . Diabetes Mellitus II Father   . Hypertension Father   . Alcohol abuse  Sister   . Alcohol abuse Brother   . Diabetes Maternal Grandmother   . Diabetes Maternal Grandfather   . Hypertension Maternal Grandfather   . Hypertension Mother     Social History Social History   Tobacco Use  . Smoking status: Current Every Day Smoker    Packs/day: 0.15    Years: 5.00    Pack years: 0.75    Types: Cigarettes  . Smokeless tobacco: Never Used  Substance Use Topics  . Alcohol use: No  . Drug use: No     Allergies   Amoxicillin and Shellfish allergy   Review of Systems Review of Systems  Constitutional: Positive for fatigue. Negative for fever.  Respiratory: Positive for cough, shortness of breath and wheezing.   Cardiovascular: Negative for chest pain.  Gastrointestinal: Negative for abdominal pain, nausea and vomiting.  Endocrine: Positive for polyuria.  Allergic/Immunologic: Positive for immunocompromised state.  All other systems reviewed and are negative.    Physical Exam Updated Vital Signs BP (!) 155/92 (BP Location: Right Arm) Comment: Simultaneous filing. User may not have seen previous data.  Pulse 84   Temp 98.3 F (36.8 C) (Oral)   Resp 14   Ht '6\' 3"'  (1.905 m)   Wt 122.5 kg   SpO2 99%   BMI 33.75 kg/m   Physical Exam Vitals signs and nursing note reviewed.  Constitutional:      General: He is not in acute distress.    Appearance: Normal appearance. He is well-developed. He is not ill-appearing.  HENT:     Head: Normocephalic and atraumatic.  Eyes:     General: No scleral icterus.       Right eye: No discharge.        Left eye: No discharge.     Conjunctiva/sclera: Conjunctivae normal.     Pupils: Pupils are equal, round, and reactive to light.  Neck:     Musculoskeletal: Normal range of motion.  Cardiovascular:     Rate and Rhythm: Normal rate and regular rhythm.  Pulmonary:     Effort: Pulmonary effort is normal. No respiratory distress.     Breath sounds: Wheezing (mild inspiratory wheezes bilaterally) present.   Abdominal:     General: There is no distension.     Palpations: Abdomen is soft.     Tenderness: There is no abdominal tenderness.  Skin:    General: Skin is warm and dry.  Neurological:     Mental Status: He is alert and oriented to person, place, and time.  Psychiatric:        Behavior: Behavior normal.      ED Treatments / Results  Labs (all labs ordered are listed, but only abnormal results are displayed) Labs Reviewed  CBG MONITORING, ED - Abnormal; Notable for the following components:      Result Value  Glucose-Capillary 346 (*)    All other components within normal limits  NOVEL CORONAVIRUS, NAA (HOSPITAL ORDER, SEND-OUT TO REF LAB)    EKG None  Radiology Dg Chest Port 1 View  Result Date: 04/26/2019 CLINICAL DATA:  28 year old male with shortness of breath fever and body ache for 3 days. EXAM: PORTABLE CHEST 1 VIEW COMPARISON:  07/03/2017 and earlier. FINDINGS: Portable AP upright view at 1012 hours. Lung volumes and mediastinal contours are within normal limits. Visualized tracheal air column is within normal limits. Allowing for portable technique the lungs are clear. No osseous abnormality identified. IMPRESSION: Negative portable chest. Electronically Signed   By: Genevie Ann M.D.   On: 04/26/2019 10:32    Procedures Procedures (including critical care time)  Medications Ordered in ED Medications - No data to display   Initial Impression / Assessment and Plan / ED Course  I have reviewed the triage vital signs and the nursing notes.  Pertinent labs & imaging results that were available during my care of the patient were reviewed by me and considered in my medical decision making (see chart for details).  28 year old male who presents with a cough and feeling unwell.  He has a history of asthma and insulin-dependent diabetes.  Blood pressure is high here but otherwise vital signs are normal.  He is overall well-appearing.  He has mild wheezing on exam.  He has  an albuterol inhaler which she has been using.  I do not think he needs steroids at this time and with his labile blood sugars it would not be prudent.  His blood sugars have been running high and CBG is 346 here.  He is likely mildly dehydrated from acute viral illness.  Due to his medical problems will obtain outpatient COVID testing.  His chest x-ray is negative.  Patient was advised to drink lots of fluids and self isolate at home until his COVID test is resulted.  He was given a work note.  Return precautions given.  Final Clinical Impressions(s) / ED Diagnoses   Final diagnoses:  Hyperglycemia  Cough    ED Discharge Orders    None       Recardo Evangelist, PA-C 04/26/19 1306    Maudie Flakes, MD 05/01/19 [phone]

## 2019-04-26 NOTE — ED Triage Notes (Signed)
Pt here for cough and generalized body aches x 1 week. Pt sts cough is mostly dry but occasionally will produce some mucous. Pt in NAD, afebrile.

## 2019-04-26 NOTE — Discharge Instructions (Addendum)
Please rest and drink plenty of fluids Take Tylenol or Ibuprofen for pain/fever Closely monitor your blood sugars Continue to use your inhaler as needed for shortness of breath You can check the results of your COVID-19 test on MyChart Return if you are worsening

## 2019-04-27 LAB — NOVEL CORONAVIRUS, NAA (HOSP ORDER, SEND-OUT TO REF LAB; TAT 18-24 HRS): SARS-CoV-2, NAA: NOT DETECTED

## 2019-04-29 ENCOUNTER — Other Ambulatory Visit: Payer: Self-pay | Admitting: Family Medicine

## 2019-04-29 ENCOUNTER — Telehealth: Payer: Self-pay | Admitting: Family Medicine

## 2019-04-29 DIAGNOSIS — E1049 Type 1 diabetes mellitus with other diabetic neurological complication: Secondary | ICD-10-CM

## 2019-04-29 NOTE — Telephone Encounter (Signed)
1) Medication(s) Requested (by name): Insulin  2) Pharmacy of Choice: [facility], [address] AT [place]   3) Special Requests:   Approved medications will be sent to the pharmacy, we will reach out if there is an issue.  Requests made after 3pm may not be addressed until the following business day!  If a patient is unsure of the name of the medication(s) please note and ask patient to call back when they are able to provide all info, do not send to responsible party until all information is available!

## 2019-04-29 NOTE — Telephone Encounter (Signed)
Duplicate request, this has already been forwarded to the PCP for approval.

## 2019-05-01 ENCOUNTER — Other Ambulatory Visit: Payer: Self-pay

## 2019-05-01 ENCOUNTER — Encounter (HOSPITAL_COMMUNITY): Payer: Self-pay | Admitting: Emergency Medicine

## 2019-05-01 ENCOUNTER — Ambulatory Visit (HOSPITAL_COMMUNITY)
Admission: EM | Admit: 2019-05-01 | Discharge: 2019-05-01 | Disposition: A | Discharge status: Home / Self Care | Payer: Managed Care, Other (non HMO) | Attending: Family Medicine | Admitting: Family Medicine

## 2019-05-01 DIAGNOSIS — N6310 Unspecified lump in the right breast, unspecified quadrant: Secondary | ICD-10-CM | POA: Diagnosis not present

## 2019-05-01 DIAGNOSIS — E1165 Type 2 diabetes mellitus with hyperglycemia: Secondary | ICD-10-CM

## 2019-05-01 DIAGNOSIS — E1049 Type 1 diabetes mellitus with other diabetic neurological complication: Secondary | ICD-10-CM

## 2019-05-01 MED ORDER — TRAMADOL HCL 50 MG PO TABS: 50.0000 mg | ORAL_TABLET | Freq: Four times a day (QID) | ORAL | 0 refills | Status: DC | PRN

## 2019-05-01 MED ORDER — SULFAMETHOXAZOLE-TRIMETHOPRIM 800-160 MG PO TABS: 1.0000 | ORAL_TABLET | Freq: Two times a day (BID) | ORAL | 0 refills | Status: DC

## 2019-05-01 MED ORDER — INSULIN LISPRO (1 UNIT DIAL) 100 UNIT/ML (KWIKPEN): PEN_INJECTOR | SUBCUTANEOUS | 0 refills | Status: DC

## 2019-05-01 NOTE — ED Provider Notes (Signed)
[facility]   [id number] 05/01/19 Arrival Time: 1443  ASSESSMENT & PLAN:  1. Breast mass, right   2. Type 2 diabetes mellitus with hyperglycemia, with long-term current use of insulin (HCC)    Question cyst vs forming abscess vs other mass.  He will first try and contact PCP for referral to evaluate mass/cyst/abscess of R breast area. Below if needed.  Follow-up Information    Schedule an appointment as soon as possible for a visit  with [place].   Contact information: [address] [place] [address]         Meds ordered this encounter  Medications  . insulin lispro (HUMALOG KWIKPEN) 100 UNIT/ML KwikPen    Sig: INJECT 0 TO 12 UNITS UNDER THE SKIN THREE TIMES DAILY PER SLIDING SCALE    Dispense:  9 mL    Refill:  0  . sulfamethoxazole-trimethoprim (BACTRIM DS) 800-160 MG tablet    Sig: Take 1 tablet by mouth 2 (two) times daily for 10 days.    Dispense:  20 tablet    Refill:  0  . traMADol (ULTRAM) 50 MG tablet    Sig: Take 1 tablet (50 mg total) by mouth every 6 (six) hours as needed.    Dispense:  15 tablet    Refill:  0   [place] Controlled Substances Registry consulted for this patient. I feel the risk/benefit ratio today is favorable for proceeding with this prescription for a controlled substance. Medication sedation precautions given. More than 25 minutes spend on exam, counseling, and discussing coordination of care.  Reviewed expectations re: course of current medical issues. Questions answered. Outlined signs and symptoms indicating need for more acute intervention. Patient verbalized understanding. After Visit Summary given.   SUBJECTIVE:  Christopher Douglas is a diabetic 28 y.o. male who presents with a skin complaint. Reports blood sugar running higher than usual over the past few weeks. Requests refill of insulin.   Location: R breast Onset: gradual Duration: has noticed pain and  "feeling like something is swelling" under skin of R breast Associated pruritis? none Associated pain? "a little"; "more if I push on it" Progression: feels size has increased since first noting this  Drainage? No  Known trigger? No  New soaps/lotions/topicals/detergents/environmental exposures? No Contacts with similar? No Recent travel? No  Other associated symptoms: none Therapies tried thus far: none Arthralgia or myalgia? none Recent illness? none Fever? None Normal PO intake without n/v. No specific aggravating or alleviating factors reported. No h/o similar reported.  ROS: As per HPI. All other systems negative.   OBJECTIVE: Vitals:   05/01/19 1642  BP: (!) 144/77  Pulse: (!) 106  Resp: 18  Temp: 99 F (37.2 C)  TempSrc: Oral  SpO2: 97%    General appearance: alert; no distress Neck: supple with FROM; no LAD Lungs: clear to auscultation bilaterally Heart: slight tachycardia; regular Chest: gynecomastia; approx 3 x 4 cm firm mass palpated just superior to nipple with reported discomfort; question very small opening of inverted/flat nipple; no active bleeding or drainage; no significant overlying skin changes but skin does feel slightly warmer when compared to opposite breast; no axillary LAD appreciated Abd: soft; non-tender Extremities: no edema Skin: warm and dry; no rashes or open wounds Psychological: alert and cooperative; normal mood and affect  Allergies  Allergen Reactions  . Amoxicillin Hives    Did it involve swelling of the face/tongue/throat, SOB, or low BP? Unknown Did it involve  sudden or severe rash/hives, skin peeling, or any reaction on the inside of your mouth or nose? Yes Did you need to seek medical attention at a hospital or doctor's office? Unknown When did it last happen? childhood If all above answers are "NO", may proceed with cephalosporin use.   Marland Kitchen. Shellfish Allergy Hives    Specifically shrimp    Past Medical History:   Diagnosis Date  . Asthma   . Diabetes mellitus without complication (HCC)   . Hypertension    Social History   Socioeconomic History  . Marital status: Single    Spouse name: Not on file  . Number of children: Not on file  . Years of education: Not on file  . Highest education level: Not on file  Occupational History  . Not on file  Social Needs  . Financial resource strain: Not on file  . Food insecurity:    Worry: Not on file    Inability: Not on file  . Transportation needs:    Medical: Not on file    Non-medical: Not on file  Tobacco Use  . Smoking status: Current Every Day Smoker    Packs/day: 0.15    Years: 5.00    Pack years: 0.75    Types: Cigarettes  . Smokeless tobacco: Never Used  Substance and Sexual Activity  . Alcohol use: No  . Drug use: No  . Sexual activity: Yes    Birth control/protection: Condom  Lifestyle  . Physical activity:    Days per week: Not on file    Minutes per session: Not on file  . Stress: Not on file  Relationships  . Social connections:    Talks on phone: Not on file    Gets together: Not on file    Attends religious service: Not on file    Active member of club or organization: Not on file    Attends meetings of clubs or organizations: Not on file    Relationship status: Not on file  . Intimate partner violence:    Fear of current or ex partner: Not on file    Emotionally abused: Not on file    Physically abused: Not on file    Forced sexual activity: Not on file  Other Topics Concern  . Not on file  Social History Narrative  . Not on file   Family History  Problem Relation Age of Onset  . Diabetes Mellitus II Father   . Hypertension Father   . Alcohol abuse Sister   . Alcohol abuse Brother   . Diabetes Maternal Grandmother   . Diabetes Maternal Grandfather   . Hypertension Maternal Grandfather   . Hypertension Mother    Past Surgical History:  Procedure Laterality Date  . FOOT SURGERY       Mardella LaymanHagler, Amora Sheehy,  MD 05/02/19 [phone]

## 2019-05-01 NOTE — Discharge Instructions (Addendum)
Be aware, pain medications may cause drowsiness. Please do not drive, operate heavy machinery or make important decisions while on this medication, it can cloud your judgement.  

## 2019-05-01 NOTE — ED Triage Notes (Signed)
Patient says right chest feels like when he had an abscess.  Pain started Saturday.  Burning sensation to right breast

## 2019-05-05 ENCOUNTER — Emergency Department (HOSPITAL_COMMUNITY)
Admission: EM | Admit: 2019-05-05 | Discharge: 2019-05-05 | Disposition: A | Discharge status: Home / Self Care | Payer: BC Managed Care – PPO | Attending: Emergency Medicine | Admitting: Emergency Medicine

## 2019-05-05 ENCOUNTER — Other Ambulatory Visit: Payer: Self-pay

## 2019-05-05 ENCOUNTER — Encounter (HOSPITAL_COMMUNITY): Payer: Self-pay | Admitting: Emergency Medicine

## 2019-05-05 DIAGNOSIS — Z794 Long term (current) use of insulin: Secondary | ICD-10-CM | POA: Insufficient documentation

## 2019-05-05 DIAGNOSIS — J45909 Unspecified asthma, uncomplicated: Secondary | ICD-10-CM | POA: Insufficient documentation

## 2019-05-05 DIAGNOSIS — Z79899 Other long term (current) drug therapy: Secondary | ICD-10-CM | POA: Diagnosis not present

## 2019-05-05 DIAGNOSIS — E119 Type 2 diabetes mellitus without complications: Secondary | ICD-10-CM | POA: Insufficient documentation

## 2019-05-05 DIAGNOSIS — N644 Mastodynia: Secondary | ICD-10-CM | POA: Diagnosis not present

## 2019-05-05 DIAGNOSIS — N611 Abscess of the breast and nipple: Secondary | ICD-10-CM

## 2019-05-05 DIAGNOSIS — I10 Essential (primary) hypertension: Secondary | ICD-10-CM | POA: Diagnosis not present

## 2019-05-05 DIAGNOSIS — F17210 Nicotine dependence, cigarettes, uncomplicated: Secondary | ICD-10-CM | POA: Diagnosis not present

## 2019-05-05 MED ORDER — LIDOCAINE-EPINEPHRINE 2 %-1:100000 IJ SOLN
10.0000 mL | Freq: Once | INTRAMUSCULAR | Status: AC
  Administered 2019-05-05: 10 mL via INTRADERMAL
  Filled 2019-05-05: qty 10

## 2019-05-05 MED ORDER — CLINDAMYCIN HCL 150 MG PO CAPS: 450.0000 mg | ORAL_CAPSULE | Freq: Four times a day (QID) | ORAL | 0 refills | Status: AC

## 2019-05-05 MED ORDER — HYDROCODONE-ACETAMINOPHEN 5-325 MG PO TABS: 1.0000 | ORAL_TABLET | Freq: Three times a day (TID) | ORAL | 0 refills | Status: DC | PRN

## 2019-05-05 MED ORDER — CLINDAMYCIN HCL 150 MG PO CAPS
450.0000 mg | ORAL_CAPSULE | Freq: Once | ORAL | Status: AC
  Administered 2019-05-05: 450 mg via ORAL
  Filled 2019-05-05: qty 3

## 2019-05-05 MED ORDER — LIDOCAINE-EPINEPHRINE (PF) 2 %-1:200000 IJ SOLN
INTRAMUSCULAR | Status: AC
  Filled 2019-05-05: qty 20

## 2019-05-05 MED ORDER — HYDROCODONE-ACETAMINOPHEN 5-325 MG PO TABS
1.0000 | ORAL_TABLET | Freq: Once | ORAL | Status: AC
  Administered 2019-05-05: 1 via ORAL
  Filled 2019-05-05: qty 1

## 2019-05-05 NOTE — ED Provider Notes (Signed)
[facility] EMERGENCY DEPARTMENT Provider Note  CSN: [id number] Arrival date & time: 05/05/19 0305  Chief Complaint(s) Breast Pain  HPI Christopher Douglas is a 28 y.o. male with a history of diabetes who presents to the emergency department with 10 days of gradually worsening right breast pain and swelling with redness.  Reports that this began as a small lump on his breast which she tried to pop.  He was seen 1 week ago urgent care and treated with oral antibiotics for possible abscess.  Symptoms progressed and worsened even on antibiotics.  Patient denies any trauma.  Reports prior history of abscess.  Denies any chest pain or shortness of breath.  No abdominal pain.  Endorses mild chills.  No overt fevers.  HPI  Past Medical History Past Medical History:  Diagnosis Date  . Asthma   . Diabetes mellitus without complication ([place])   . Hypertension    Patient Active Problem List   Diagnosis Date Noted  . GERD (gastroesophageal reflux disease) 05/12/2017  . Hyperlipidemia 04/06/2016  . Hypertension 06/29/2015  . Diabetic neuropathy ([place]) 09/16/2014  . DM type 1 (diabetes mellitus, type 1) ([place]) 01/04/2013  . Unspecified constipation 01/04/2013  . Asthma 01/03/2013   Home Medication(s) Prior to Admission medications   Medication Sig Start Date End Date Taking? Authorizing Provider  albuterol (PROVENTIL HFA;VENTOLIN HFA) 108 (90 Base) MCG/ACT inhaler Inhale 2 puffs into the lungs every 4 (four) hours as needed for wheezing or shortness of breath. 08/06/18   Charlott Rakes, MD  Blood Glucose Monitoring Suppl (ONE TOUCH ULTRA MINI) w/Device KIT 1 each by Does not apply route 3 (three) times daily. 11/24/17   Charlott Rakes, MD  clindamycin (CLEOCIN) 150 MG capsule Take 3 capsules (450 mg total) by mouth every 6 (six) hours for 7 days. 05/05/19 05/12/19  Fatima Blank, MD  gabapentin (NEURONTIN) 300 MG capsule Take 1 capsule (300 mg total) by mouth 2 (two) times daily.  08/06/18   Charlott Rakes, MD  glucose blood (ONE TOUCH ULTRA TEST) test strip Use as instructed three times daily before meals 08/13/18   Charlott Rakes, MD  HYDROcodone-acetaminophen (NORCO/VICODIN) 5-325 MG tablet Take 1 tablet by mouth every 8 (eight) hours as needed for up to 3 days for severe pain (That is not improved by your scheduled acetaminophen regimen). Please do not exceed 4000 mg of acetaminophen (Tylenol) a 24-hour period. Please note that he may be prescribed additional medicine that contains acetaminophen. 05/05/19 05/08/19  Fatima Blank, MD  ibuprofen (ADVIL) 800 MG tablet Take 1 tablet (800 mg total) by mouth 3 (three) times daily. 04/05/19   Raylene Everts, MD  Insulin Glargine (LANTUS SOLOSTAR) 100 UNIT/ML Solostar Pen Inject 30 Units into the skin 2 (two) times daily. 08/06/18   Charlott Rakes, MD  insulin lispro (HUMALOG KWIKPEN) 100 UNIT/ML KiwkPen Inject 0-0.12 mLs (0-12 Units total) into the skin 3 (three) times daily. As per sliding scale. MUST MAKE APPT FOR FURTHER REFILLS 08/06/18   Charlott Rakes, MD  insulin lispro (HUMALOG KWIKPEN) 100 UNIT/ML KwikPen INJECT 0 TO 12 UNITS UNDER THE SKIN THREE TIMES DAILY PER SLIDING SCALE 05/01/19   Vanessa Kick, MD  Insulin Pen Needle 31G X 5 MM MISC 1 each 4 (four) times daily -  with meals and at bedtime by Does not apply route. 10/06/17   Charlott Rakes, MD  Lancets (ONETOUCH ULTRASOFT) lancets Use as instructed 3 times daily before meals 08/06/18   Charlott Rakes, MD  lisinopril (PRINIVIL,ZESTRIL) 10 MG tablet Take 1 tablet (10 mg total) by mouth daily. 08/06/18   Charlott Rakes, MD  omeprazole (PRILOSEC) 20 MG capsule Take 1 capsule (20 mg total) by mouth daily. Patient taking differently: Take 20 mg by mouth daily as needed (acid reflux).  08/06/18   Charlott Rakes, MD  sulfamethoxazole-trimethoprim (BACTRIM DS) 800-160 MG tablet Take 1 tablet by mouth 2 (two) times daily for 10 days. 05/01/19 05/11/19  Vanessa Kick, MD   traMADol (ULTRAM) 50 MG tablet Take 1 tablet (50 mg total) by mouth every 6 (six) hours as needed. 05/01/19   Vanessa Kick, MD                                                                                                                                    Past Surgical History Past Surgical History:  Procedure Laterality Date  . FOOT SURGERY     Family History Family History  Problem Relation Age of Onset  . Diabetes Mellitus II Father   . Hypertension Father   . Alcohol abuse Sister   . Alcohol abuse Brother   . Diabetes Maternal Grandmother   . Diabetes Maternal Grandfather   . Hypertension Maternal Grandfather   . Hypertension Mother     Social History Social History   Tobacco Use  . Smoking status: Current Every Day Smoker    Packs/day: 0.15    Years: 5.00    Pack years: 0.75    Types: Cigarettes  . Smokeless tobacco: Never Used  Substance Use Topics  . Alcohol use: No  . Drug use: No   Allergies Amoxicillin and Shellfish allergy  Review of Systems Review of Systems All other systems are reviewed and are negative for acute change except as noted in the HPI  Physical Exam Vital Signs  I have reviewed the triage vital signs BP 137/81 (BP Location: Right Arm)   Pulse 83   Temp 99.1 F (37.3 C) (Oral)   Resp 18   SpO2 98%   Physical Exam Vitals signs reviewed.  Constitutional:      General: He is not in acute distress.    Appearance: He is well-developed. He is not diaphoretic.  HENT:     Head: Normocephalic and atraumatic.     Jaw: No trismus.     Right Ear: External ear normal.     Left Ear: External ear normal.     Nose: Nose normal.  Eyes:     General: No scleral icterus.    Conjunctiva/sclera: Conjunctivae normal.  Neck:     Musculoskeletal: Normal range of motion.     Trachea: Phonation normal.  Cardiovascular:     Rate and Rhythm: Normal rate and regular rhythm.  Pulmonary:     Effort: Pulmonary effort is normal. No respiratory  distress.     Breath sounds: No stridor.  Chest:     Chest wall: Swelling and tenderness present.  Comments: gynacomastia Abdominal:     General: There is no distension.  Musculoskeletal: Normal range of motion.  Neurological:     Mental Status: He is alert and oriented to person, place, and time.  Psychiatric:        Behavior: Behavior normal.     ED Results and Treatments Labs (all labs ordered are listed, but only abnormal results are displayed) Labs Reviewed - No data to display                                                                                                                       EKG  EKG Interpretation  Date/Time:    Ventricular Rate:    PR Interval:    QRS Duration:   QT Interval:    QTC Calculation:   R Axis:     Text Interpretation:        Radiology No results found. Pertinent labs & imaging results that were available during my care of the patient were reviewed by me and considered in my medical decision making (see chart for details).  Medications Ordered in ED Medications  lidocaine-EPINEPHrine (XYLOCAINE W/EPI) 2 %-1:200000 (PF) injection (  Not Given 05/05/19 0522)  lidocaine-EPINEPHrine (XYLOCAINE W/EPI) 2 %-1:100000 (with pres) injection 10 mL (10 mLs Intradermal Given 05/05/19 0522)  HYDROcodone-acetaminophen (NORCO/VICODIN) 5-325 MG per tablet 1 tablet (1 tablet Oral Given 05/05/19 0641)  clindamycin (CLEOCIN) capsule 450 mg (450 mg Oral Given 05/05/19 0641)                                                                                                                                    Procedures Ultrasound ED Soft Tissue  Date/Time: 05/05/2019 6:52 AM Performed by: Fatima Blank, MD Authorized by: Fatima Blank, MD   Procedure details:    Indications: localization of abscess     Transverse view:  Visualized   Longitudinal view:  Visualized   Images: archived   Location:    Location: breast     Side:  Right  Findings:     abscess present    cellulitis present .Marland KitchenIncision and Drainage  Date/Time: 05/05/2019 6:53 AM Performed by: Fatima Blank, MD Authorized by: Fatima Blank, MD   Consent:    Consent obtained:  Verbal   Consent given by:  Patient   Risks discussed:  Bleeding, incomplete drainage and infection   Alternatives discussed:  Alternative  treatment and delayed treatment Location:    Type:  Abscess   Size:  6   Location:  Trunk   Trunk location:  R breast Pre-procedure details:    Skin preparation:  Chloraprep Anesthesia (see MAR for exact dosages):    Anesthesia method:  Local infiltration   Local anesthetic:  Lidocaine 2% WITH epi Procedure type:    Complexity:  Simple Procedure details:    Needle aspiration: yes     Needle size:  18 G   Incision types:  Elliptical   Incision depth:  Subcutaneous   Scalpel blade:  11   Wound management:  Probed and deloculated and extensive cleaning   Drainage:  Purulent   Drainage amount:  Moderate   Wound treatment:  Drain placed   Packing material: penrose drain. Post-procedure details:    Patient tolerance of procedure:  Tolerated well, no immediate complications    (including critical care time)  Medical Decision Making / ED Course I have reviewed the nursing notes for this encounter and the patient's prior records (if available in EHR or on provided paperwork).    Right breast abscess confirmed with needle aspiration and ultrasound.  I&D as above.  Given the erythema and cellulitis, patient provided with clindamycin.  Recommended 2 to 3-day follow-up for wound check.  Final Clinical Impression(s) / ED Diagnoses Final diagnoses:  Breast abscess    Disposition: Discharge  Condition: Good  I have discussed the results, Dx and Tx plan with the patient who expressed understanding and agree(s) with the plan. Discharge instructions discussed at great length. The patient was given strict return  precautions who verbalized understanding of the instructions. No further questions at time of discharge.    ED Discharge Orders         Ordered    HYDROcodone-acetaminophen (NORCO/VICODIN) 5-325 MG tablet  Every 8 hours PRN     05/05/19 0704    clindamycin (CLEOCIN) 150 MG capsule  Every 6 hours     05/05/19 0704           Follow Up: [facility] [place] [address] [place] [id number] In 2 days      This chart was dictated using voice recognition software.  Despite best efforts to proofread,  errors can occur which can change the documentation meaning.   Fatima Blank, MD 05/05/19 [phone]

## 2019-05-05 NOTE — ED Triage Notes (Signed)
C/o pain to R breast with chills x 10 days.  States he was seen at [facility] for same earlier in week and taking antibiotic and pain medication without relief.Also reports productive cough with yellow sputum.

## 2019-05-06 ENCOUNTER — Ambulatory Visit (HOSPITAL_COMMUNITY)
Admission: EM | Admit: 2019-05-06 | Discharge: 2019-05-06 | Disposition: A | Discharge status: Home / Self Care | Payer: Managed Care, Other (non HMO) | Attending: Family Medicine | Admitting: Family Medicine

## 2019-05-06 ENCOUNTER — Other Ambulatory Visit: Payer: Self-pay

## 2019-05-06 ENCOUNTER — Encounter (HOSPITAL_COMMUNITY): Payer: Self-pay

## 2019-05-06 DIAGNOSIS — L0291 Cutaneous abscess, unspecified: Secondary | ICD-10-CM | POA: Diagnosis not present

## 2019-05-06 DIAGNOSIS — E109 Type 1 diabetes mellitus without complications: Secondary | ICD-10-CM

## 2019-05-06 DIAGNOSIS — I10 Essential (primary) hypertension: Secondary | ICD-10-CM

## 2019-05-06 MED ORDER — CEFTRIAXONE SODIUM 1 G IJ SOLR
1.0000 g | Freq: Once | INTRAMUSCULAR | Status: AC
  Administered 2019-05-06: 1 g via INTRAMUSCULAR

## 2019-05-06 MED ORDER — HYDROCODONE-ACETAMINOPHEN 7.5-325 MG PO TABS: 1.0000 | ORAL_TABLET | Freq: Four times a day (QID) | ORAL | 0 refills | Status: DC | PRN

## 2019-05-06 MED ORDER — CEFTRIAXONE SODIUM 1 G IJ SOLR
INTRAMUSCULAR | Status: AC
  Filled 2019-05-06: qty 10

## 2019-05-06 NOTE — ED Provider Notes (Signed)
[facility]    CSN: [id number] Arrival date & time: 05/06/19  1120     History   Chief Complaint Chief Complaint  Patient presents with  . Abscess    HPI Christopher Douglas is a 28 y.o. male.   HPI  Patient is here for follow-up of his breast abscess.  He was seen on 05/01/2019 for a breast mass.  There was not a fluctuant area to I&D.  He was treated with warm compresses and sulfamethoxazole, tramadol for pain.  Yesterday on 05/05/2019 the pain was unbearable so he went to the emergency department.  They did an ultrasound.  Fluctuant center was identified.  It was anesthetized, drained, deloculated, irrigated, and a drain was placed.  He was given clindamycin, hydrocodone for pain. He is compliant with clindamycin.  He states the pain medicine is gone, and the pain is again unbearable.  No fever or chills.  His blood sugar this morning was around 200.  He is a type I diabetic with poor control.   Past Medical History:  Diagnosis Date  . Asthma   . Diabetes mellitus without complication ([place])   . Hypertension     Patient Active Problem List   Diagnosis Date Noted  . GERD (gastroesophageal reflux disease) 05/12/2017  . Hyperlipidemia 04/06/2016  . Hypertension 06/29/2015  . Diabetic neuropathy ([place]) 09/16/2014  . DM type 1 (diabetes mellitus, type 1) ([place]) 01/04/2013  . Unspecified constipation 01/04/2013  . Asthma 01/03/2013    Past Surgical History:  Procedure Laterality Date  . FOOT SURGERY         Home Medications    Prior to Admission medications   Medication Sig Start Date End Date Taking? Authorizing Provider  clindamycin (CLEOCIN) 150 MG capsule Take 3 capsules (450 mg total) by mouth every 6 (six) hours for 7 days. 05/05/19 05/12/19 Yes Cardama, Grayce Sessions, MD  gabapentin (NEURONTIN) 300 MG capsule Take 1 capsule (300 mg total) by mouth 2 (two) times daily. 08/06/18  Yes Charlott Rakes, MD  albuterol (PROVENTIL HFA;VENTOLIN HFA) 108 (90 Base)  MCG/ACT inhaler Inhale 2 puffs into the lungs every 4 (four) hours as needed for wheezing or shortness of breath. 08/06/18   Charlott Rakes, MD  Blood Glucose Monitoring Suppl (ONE TOUCH ULTRA MINI) w/Device KIT 1 each by Does not apply route 3 (three) times daily. 11/24/17   Charlott Rakes, MD  glucose blood (ONE TOUCH ULTRA TEST) test strip Use as instructed three times daily before meals 08/13/18   Charlott Rakes, MD  HYDROcodone-acetaminophen (NORCO) 7.5-325 MG tablet Take 1 tablet by mouth every 6 (six) hours as needed for moderate pain. 05/06/19   Raylene Everts, MD  ibuprofen (ADVIL) 800 MG tablet Take 1 tablet (800 mg total) by mouth 3 (three) times daily. 04/05/19   Raylene Everts, MD  Insulin Glargine (LANTUS SOLOSTAR) 100 UNIT/ML Solostar Pen Inject 30 Units into the skin 2 (two) times daily. 08/06/18   Charlott Rakes, MD  insulin lispro (HUMALOG KWIKPEN) 100 UNIT/ML KiwkPen Inject 0-0.12 mLs (0-12 Units total) into the skin 3 (three) times daily. As per sliding scale. MUST MAKE APPT FOR FURTHER REFILLS 08/06/18   Charlott Rakes, MD  insulin lispro (HUMALOG KWIKPEN) 100 UNIT/ML KwikPen INJECT 0 TO 12 UNITS UNDER THE SKIN THREE TIMES DAILY PER SLIDING SCALE 05/01/19   Vanessa Kick, MD  Insulin Pen Needle 31G X 5 MM MISC 1 each 4 (four) times daily -  with meals and at bedtime by Does  not apply route. 10/06/17   Charlott Rakes, MD  Lancets (ONETOUCH ULTRASOFT) lancets Use as instructed 3 times daily before meals 08/06/18   Charlott Rakes, MD  lisinopril (PRINIVIL,ZESTRIL) 10 MG tablet Take 1 tablet (10 mg total) by mouth daily. 08/06/18   Charlott Rakes, MD  omeprazole (PRILOSEC) 20 MG capsule Take 1 capsule (20 mg total) by mouth daily. Patient taking differently: Take 20 mg by mouth daily as needed (acid reflux).  08/06/18   Charlott Rakes, MD    Family History Family History  Problem Relation Age of Onset  . Diabetes Mellitus II Father   . Hypertension Father   . Alcohol abuse  Sister   . Alcohol abuse Brother   . Diabetes Maternal Grandmother   . Diabetes Maternal Grandfather   . Hypertension Maternal Grandfather   . Hypertension Mother     Social History Social History   Tobacco Use  . Smoking status: Current Every Day Smoker    Packs/day: 0.15    Years: 5.00    Pack years: 0.75    Types: Cigarettes  . Smokeless tobacco: Never Used  Substance Use Topics  . Alcohol use: No  . Drug use: No     Allergies   Amoxicillin and Shellfish allergy   Review of Systems Review of Systems  Constitutional: Negative for chills and fever.  HENT: Negative for ear pain and sore throat.   Eyes: Negative for pain and visual disturbance.  Respiratory: Negative for cough and shortness of breath.   Cardiovascular: Negative for chest pain and palpitations.  Gastrointestinal: Negative for abdominal pain and vomiting.  Genitourinary: Negative for dysuria and hematuria.  Musculoskeletal: Negative for arthralgias and back pain.  Skin: Positive for wound. Negative for color change and rash.  Neurological: Negative for seizures and syncope.  All other systems reviewed and are negative.    Physical Exam Triage Vital Signs ED Triage Vitals  Enc Vitals Group     BP 05/06/19 1150 (!) 136/91     Pulse Rate 05/06/19 1150 90     Resp 05/06/19 1150 18     Temp 05/06/19 1150 98.6 F (37 C)     Temp Source 05/06/19 1150 Oral     SpO2 05/06/19 1150 99 %     Weight --      Height --      Head Circumference --      Peak Flow --      Pain Score 05/06/19 1146 8     Pain Loc --      Pain Edu? --      Excl. in [place]? --    No data found.  Updated Vital Signs BP (!) 136/91 (BP Location: Right Arm)   Pulse 90   Temp 98.6 F (37 C) (Oral)   Resp 18   SpO2 99%       Physical Exam Constitutional:      General: He is not in acute distress.    Appearance: He is well-developed.  HENT:     Head: Normocephalic and atraumatic.  Eyes:     Conjunctiva/sclera:  Conjunctivae normal.     Pupils: Pupils are equal, round, and reactive to light.  Neck:     Musculoskeletal: Normal range of motion.  Cardiovascular:     Rate and Rhythm: Normal rate.  Pulmonary:     Effort: Pulmonary effort is normal. No respiratory distress.  Abdominal:     General: There is no distension.     Palpations: Abdomen  is soft.  Musculoskeletal: Normal range of motion.  Skin:    General: Skin is warm and dry.     Comments: See photo.  Erythema and induration surrounding abscess, exquisitely tender  Neurological:     Mental Status: He is alert.        UC Treatments / Results  Labs (all labs ordered are listed, but only abnormal results are displayed) Labs Reviewed - No data to display  EKG None  Radiology No results found.  Procedures Procedures (including critical care time)  Medications Ordered in UC Medications  cefTRIAXone (ROCEPHIN) injection 1 g (1 g Intramuscular Given 05/06/19 1254)  cefTRIAXone (ROCEPHIN) 1 g injection (has no administration in time range)    Initial Impression / Assessment and Plan / UC Course  I have reviewed the triage vital signs and the nursing notes.  Pertinent labs & imaging results that were available during my care of the patient were reviewed by me and considered in my medical decision making (see chart for details).      Final Clinical Impressions(s) / UC Diagnoses   Final diagnoses:  Abscess     Discharge Instructions     Continue clindamycin.  Take with food Decrease activity Warm compresses to area as tolerated Pain medicine every 4-6 hours as needed for severe pain.  This can cause drowsiness.  Please take with food.  Do not drive on this medicine.  Caution, constipation Follow-up with the ER physician tomorrow or the next day If you are worse instead of better, increasing pain, increasing swelling, fever or chills, or if your sugar is out of control (over 500) you need to go to the ER    ED  Prescriptions    Medication Sig Dispense Auth. Provider   HYDROcodone-acetaminophen (NORCO) 7.5-325 MG tablet Take 1 tablet by mouth every 6 (six) hours as needed for moderate pain. 20 tablet Raylene Everts, MD     Controlled Substance Prescriptions [place] Controlled Substance Registry consulted? Yes, I have consulted the [place] Controlled Substances Registry for this patient, and feel the risk/benefit ratio today is favorable for proceeding with this prescription for a controlled substance.   Raylene Everts, MD 05/06/19 [phone]

## 2019-05-06 NOTE — Discharge Instructions (Addendum)
Continue clindamycin.  Take with food Decrease activity Warm compresses to area as tolerated Pain medicine every 4-6 hours as needed for severe pain.  This can cause drowsiness.  Please take with food.  Do not drive on this medicine.  Caution, constipation Follow-up with the ER physician tomorrow or the next day If you are worse instead of better, increasing pain, increasing swelling, fever or chills, or if your sugar is out of control (over 500) you need to go to the ER

## 2019-05-06 NOTE — ED Triage Notes (Signed)
Patient presents to Urgent Care with complaints of continued pain to abscess area on his right chest since yesterday. Patient reports he had it drained yesterday and a tube was put in to let it continue to drain. Pt states he was given pain meds but he is almost out of them and is not sure th pain is supposed to be this bad.

## 2019-05-08 ENCOUNTER — Other Ambulatory Visit: Payer: Self-pay

## 2019-05-08 ENCOUNTER — Encounter (HOSPITAL_BASED_OUTPATIENT_CLINIC_OR_DEPARTMENT_OTHER): Payer: Self-pay | Admitting: Emergency Medicine

## 2019-05-08 ENCOUNTER — Emergency Department (HOSPITAL_BASED_OUTPATIENT_CLINIC_OR_DEPARTMENT_OTHER)
Admission: EM | Admit: 2019-05-08 | Discharge: 2019-05-08 | Disposition: A | Discharge status: Home / Self Care | Payer: BC Managed Care – PPO | Attending: Emergency Medicine | Admitting: Emergency Medicine

## 2019-05-08 ENCOUNTER — Inpatient Hospital Stay: Payer: Managed Care, Other (non HMO)

## 2019-05-08 DIAGNOSIS — J45909 Unspecified asthma, uncomplicated: Secondary | ICD-10-CM | POA: Diagnosis not present

## 2019-05-08 DIAGNOSIS — E109 Type 1 diabetes mellitus without complications: Secondary | ICD-10-CM | POA: Insufficient documentation

## 2019-05-08 DIAGNOSIS — F17210 Nicotine dependence, cigarettes, uncomplicated: Secondary | ICD-10-CM | POA: Diagnosis not present

## 2019-05-08 DIAGNOSIS — I10 Essential (primary) hypertension: Secondary | ICD-10-CM | POA: Insufficient documentation

## 2019-05-08 DIAGNOSIS — Z79899 Other long term (current) drug therapy: Secondary | ICD-10-CM | POA: Insufficient documentation

## 2019-05-08 DIAGNOSIS — N611 Abscess of the breast and nipple: Secondary | ICD-10-CM | POA: Insufficient documentation

## 2019-05-08 DIAGNOSIS — G8918 Other acute postprocedural pain: Secondary | ICD-10-CM | POA: Diagnosis not present

## 2019-05-08 NOTE — ED Provider Notes (Signed)
[facility] EMERGENCY DEPARTMENT Provider Note  CSN: [id number] Arrival date & time: 05/08/19 0141  Chief Complaint(s) Wound Check  HPI Christopher Douglas is a 27 y.o. male with h/o IDDM I recently seen by me for right breast abscess that was I&D'd with drain placed. He returns for wound check. He reports persistent pain controlled with Norco (taking 2 per day). Pain is aching. Worse with palpation of the right breast. Reports area is still draining purulence.  He is compliant with Abx. No fever or chills. No N/V. Erythema is improving.   HPI  Past Medical History Past Medical History:  Diagnosis Date  . Asthma   . Diabetes mellitus without complication (HCC)   . Hypertension    Patient Active Problem List   Diagnosis Date Noted  . GERD (gastroesophageal reflux disease) 05/12/2017  . Hyperlipidemia 04/06/2016  . Hypertension 06/29/2015  . Diabetic neuropathy (HCC) 09/16/2014  . DM type 1 (diabetes mellitus, type 1) (HCC) 01/04/2013  . Unspecified constipation 01/04/2013  . Asthma 01/03/2013   Home Medication(s) Prior to Admission medications   Medication Sig Start Date End Date Taking? Authorizing Provider  albuterol (PROVENTIL HFA;VENTOLIN HFA) 108 (90 Base) MCG/ACT inhaler Inhale 2 puffs into the lungs every 4 (four) hours as needed for wheezing or shortness of breath. 08/06/18   Newlin, Enobong, MD  Blood Glucose Monitoring Suppl (ONE TOUCH ULTRA MINI) w/Device KIT 1 each by Does not apply route 3 (three) times daily. 11/24/17   Newlin, Enobong, MD  clindamycin (CLEOCIN) 150 MG capsule Take 3 capsules (450 mg total) by mouth every 6 (six) hours for 7 days. 05/05/19 05/12/19  Cardama, Pedro Eduardo, MD  gabapentin (NEURONTIN) 300 MG capsule Take 1 capsule (300 mg total) by mouth 2 (two) times daily. 08/06/18   Newlin, Enobong, MD  glucose blood (ONE TOUCH ULTRA TEST) test strip Use as instructed three times daily before meals 08/13/18   Newlin, Enobong, MD   HYDROcodone-acetaminophen (NORCO) 7.5-325 MG tablet Take 1 tablet by mouth every 6 (six) hours as needed for moderate pain. 05/06/19   Nelson, Yvonne Sue, MD  ibuprofen (ADVIL) 800 MG tablet Take 1 tablet (800 mg total) by mouth 3 (three) times daily. 04/05/19   Nelson, Yvonne Sue, MD  Insulin Glargine (LANTUS SOLOSTAR) 100 UNIT/ML Solostar Pen Inject 30 Units into the skin 2 (two) times daily. 08/06/18   Newlin, Enobong, MD  insulin lispro (HUMALOG KWIKPEN) 100 UNIT/ML KiwkPen Inject 0-0.12 mLs (0-12 Units total) into the skin 3 (three) times daily. As per sliding scale. MUST MAKE APPT FOR FURTHER REFILLS 08/06/18   Newlin, Enobong, MD  insulin lispro (HUMALOG KWIKPEN) 100 UNIT/ML KwikPen INJECT 0 TO 12 UNITS UNDER THE SKIN THREE TIMES DAILY PER SLIDING SCALE 05/01/19   Hagler, Brian, MD  Insulin Pen Needle 31G X 5 MM MISC 1 each 4 (four) times daily -  with meals and at bedtime by Does not apply route. 10/06/17   Newlin, Enobong, MD  Lancets (ONETOUCH ULTRASOFT) lancets Use as instructed 3 times daily before meals 08/06/18   Newlin, Enobong, MD  lisinopril (PRINIVIL,ZESTRIL) 10 MG tablet Take 1 tablet (10 mg total) by mouth daily. 08/06/18   Newlin, Enobong, MD  omeprazole (PRILOSEC) 20 MG capsule Take 1 capsule (20 mg total) by mouth daily. Patient taking differently: Take 20 mg by mouth daily as needed (acid reflux).  08/06/18   Newlin, Enobong, MD                                                                                                                                      Past Surgical History Past Surgical History:  Procedure Laterality Date  . FOOT SURGERY     Family History Family History  Problem Relation Age of Onset  . Diabetes Mellitus II Father   . Hypertension Father   . Alcohol abuse Sister   . Alcohol abuse Brother   . Diabetes Maternal Grandmother   . Diabetes Maternal Grandfather   . Hypertension Maternal Grandfather   . Hypertension Mother     Social History Social  History   Tobacco Use  . Smoking status: Current Every Day Smoker    Packs/day: 0.15    Years: 5.00    Pack years: 0.75    Types: Cigarettes  . Smokeless tobacco: Never Used  Substance Use Topics  . Alcohol use: No  . Drug use: No   Allergies Amoxicillin and Shellfish allergy  Review of Systems Review of Systems As noted in HPI Physical Exam Vital Signs  I have reviewed the triage vital signs BP (!) 146/82 (BP Location: Right Arm)   Pulse 82   Temp 98.3 F (36.8 C) (Oral)   Resp 16   Ht 6' 3" (1.905 m)   Wt 124.7 kg   SpO2 97%   BMI 34.37 kg/m   Physical Exam Vitals signs reviewed.  Constitutional:      General: He is not in acute distress.    Appearance: He is well-developed. He is not diaphoretic.  HENT:     Head: Normocephalic and atraumatic.     Jaw: No trismus.     Right Ear: External ear normal.     Left Ear: External ear normal.     Nose: Nose normal.  Eyes:     General: No scleral icterus.    Conjunctiva/sclera: Conjunctivae normal.  Neck:     Musculoskeletal: Normal range of motion.     Trachea: Phonation normal.  Cardiovascular:     Rate and Rhythm: Normal rate and regular rhythm.  Pulmonary:     Effort: Pulmonary effort is normal. No respiratory distress.     Breath sounds: No stridor.  Chest:       Comments: Gynecomastia  Abdominal:     General: There is no distension.  Musculoskeletal: Normal range of motion.  Neurological:     Mental Status: He is alert and oriented to person, place, and time.  Psychiatric:        Behavior: Behavior normal.     ED Results and Treatments Labs (all labs ordered are listed, but only abnormal results are displayed) Labs Reviewed - No data to display                                                                                                                       EKG  EKG Interpretation  Date/Time:    Ventricular Rate:    PR Interval:    QRS Duration:   QT Interval:    QTC Calculation:   R  Axis:     Text Interpretation:          Radiology No results found. Pertinent labs & imaging results that were available during my care of the patient were reviewed by me and considered in my medical decision making (see chart for details).  Medications Ordered in ED Medications - No data to display                                                                                                                                  Procedures Procedures  (including critical care time)  Medical Decision Making / ED Course I have reviewed the nursing notes for this encounter and the patient's prior records (if available in EHR or on provided paperwork).    Right breast abscess with drain in place. Still draining. Erythema improving.  Will leave drain in place for 2-3 more days to allowed continued draining. Continued warm compresses and completion of clinda.  Pt will return to [facility] 4/19 or 4/20, to have drain removed.   He was provided with contact information to [facility] in case abscess reaccumulates following drain removal.   Final Clinical Impression(s) / ED Diagnoses Final diagnoses:  Abscess of right breast   Disposition: Discharge  Condition: Good  I have discussed the results, Dx and Tx plan with the patient who expressed understanding and agree(s) with the plan. Discharge instructions discussed at great length. The patient was given strict return precautions who verbalized understanding of the instructions. No further questions at time of discharge.    ED Discharge Orders    None      Follow Up: [facility] [place] [address] [phone] On 05/10/2019 For wound re-check and drain removal  Surgery, [facility] [address] [place] [address] [phone]  Schedule an appointment as soon as possible for a visit  If abscess reaccumulates after drain has been removed.       This chart was dictated using voice recognition software.  Despite best efforts to proofread,  errors can occur which can change the documentation meaning.   Fatima Blank, MD 05/08/19 [phone]

## 2019-05-08 NOTE — ED Triage Notes (Signed)
Pt has an abscess on his right chest  Pt was seen on Sunday at [facility] and they did an I&D and placed a drainage tube  Pt was seen at urgent care yesterday and was given pain medication and additional antibiotics  Pt states he was instructed to come back here for a recheck today

## 2019-05-08 NOTE — Discharge Instructions (Signed)
Take 1000 mg of Tylenol (acetaminophen) every 8 hours. If pain is not controlled with this, you may take 1 tablet of your Vicodine/Norco, which also has acetaminophen in it. Please limit Norco to 1 tablet every 8 hours in a day.

## 2019-05-08 NOTE — ED Notes (Signed)
ED Provider at bedside. 

## 2019-05-11 ENCOUNTER — Emergency Department (HOSPITAL_COMMUNITY)
Admission: EM | Admit: 2019-05-11 | Discharge: 2019-05-11 | Disposition: A | Discharge status: Home / Self Care | Payer: BC Managed Care – PPO | Attending: Emergency Medicine | Admitting: Emergency Medicine

## 2019-05-11 ENCOUNTER — Encounter (HOSPITAL_COMMUNITY): Payer: Self-pay

## 2019-05-11 ENCOUNTER — Other Ambulatory Visit: Payer: Self-pay

## 2019-05-11 DIAGNOSIS — E109 Type 1 diabetes mellitus without complications: Secondary | ICD-10-CM | POA: Insufficient documentation

## 2019-05-11 DIAGNOSIS — Z09 Encounter for follow-up examination after completed treatment for conditions other than malignant neoplasm: Secondary | ICD-10-CM | POA: Diagnosis not present

## 2019-05-11 DIAGNOSIS — Z79899 Other long term (current) drug therapy: Secondary | ICD-10-CM | POA: Insufficient documentation

## 2019-05-11 DIAGNOSIS — F17210 Nicotine dependence, cigarettes, uncomplicated: Secondary | ICD-10-CM | POA: Diagnosis not present

## 2019-05-11 DIAGNOSIS — Z794 Long term (current) use of insulin: Secondary | ICD-10-CM | POA: Diagnosis not present

## 2019-05-11 DIAGNOSIS — N611 Abscess of the breast and nipple: Secondary | ICD-10-CM | POA: Diagnosis not present

## 2019-05-11 DIAGNOSIS — J45909 Unspecified asthma, uncomplicated: Secondary | ICD-10-CM | POA: Insufficient documentation

## 2019-05-11 DIAGNOSIS — I10 Essential (primary) hypertension: Secondary | ICD-10-CM | POA: Insufficient documentation

## 2019-05-11 NOTE — ED Triage Notes (Signed)
Pt states that he was seen Sun for abscess to R chest, drainage tube left in place, told to come back today for removal.

## 2019-05-11 NOTE — Discharge Instructions (Addendum)
You have been diagnosed today with encounter for recheck of abscess following incision and drainage.  At this time there does not appear to be the presence of an emergent medical condition, however there is always the potential for conditions to change. Please read and follow the below instructions.  Please return to the Emergency Department immediately for any new or worsening symptoms. Please be sure to follow up with your Primary Care Provider within one week regarding your visit today; please call their office to schedule an appointment even if you are feeling better for a follow-up visit. Please begin taking your antibiotic clindamycin as prescribed 450 mg 4 times daily until finished.  Please continue using warm compresses to facilitate drainage.  Please call the surgeon's office as previously instructed for follow-up regarding your abscess.  Please have your abscess rechecked in 2-3 days, you may have it rechecked at your primary care doctor's office, and urgent care, the surgeon's office or return if necessary here at the emergency department.  Get help right away if: You have severe pain or bleeding. You have fever You cannot eat or drink without vomiting. You have decreased urine output. You become short of breath. You have chest pain. You cough up blood. You feel confused You have nausea/vomiting or abdominal pain. The area where the incision and drainage occurred becomes numb or it tingles. Any new/concerning or worsening symptoms  Please read the additional information packets attached to your discharge summary.  Do not take your medicine if  develop an itchy rash, swelling in your mouth or lips, or difficulty breathing; call 911 and seek immediate emergency medical attention if this occurs.

## 2019-05-11 NOTE — ED Provider Notes (Addendum)
[facility] EMERGENCY DEPARTMENT Provider Note   CSN: [id number] Arrival date & time: 05/11/19  0608    History   Chief Complaint Chief Complaint  Patient presents with   Abscess    HPI Christopher Douglas is a 28 y.o. male with history of asthma, hypertension, diabetes presenting today for abscess recheck.  Patient had incision and drainage of right breast abscess in the emergency department on 05/05/2019, he had a drain placed at that time and was discharged with clindamycin 450 mg 4 times daily x7 days.  Patient reports that his wound has continued to improve, patient reports he only has occasional clear drainage from the abscess, reports that his erythema has resolved, describes his pain is minimal throbbing sensation only with palpation and improved with rest.  Patient reports that he has been using clindamycin 450 mg twice daily, not 4 times daily as prescribed and still has half a bottle of medicine left.  Additionally he has not followed up with [facility] surgery for his abscess.  Denies history of fever/chills, nausea/vomiting, chest pain/shortness of breath, abdominal pain or any additional concerns.    HPI  Past Medical History:  Diagnosis Date   Asthma    Diabetes mellitus without complication ([place])    Hypertension     Patient Active Problem List   Diagnosis Date Noted   GERD (gastroesophageal reflux disease) 05/12/2017   Hyperlipidemia 04/06/2016   Hypertension 06/29/2015   Diabetic neuropathy ([place]) 09/16/2014   DM type 1 (diabetes mellitus, type 1) ([place]) 01/04/2013   Unspecified constipation 01/04/2013   Asthma 01/03/2013    Past Surgical History:  Procedure Laterality Date   FOOT SURGERY          Home Medications    Prior to Admission medications   Medication Sig Start Date End Date Taking? Authorizing Provider  albuterol (PROVENTIL HFA;VENTOLIN HFA) 108 (90 Base) MCG/ACT inhaler Inhale 2 puffs into the lungs every 4  (four) hours as needed for wheezing or shortness of breath. 08/06/18   Charlott Rakes, MD  Blood Glucose Monitoring Suppl (ONE TOUCH ULTRA MINI) w/Device KIT 1 each by Does not apply route 3 (three) times daily. 11/24/17   Charlott Rakes, MD  clindamycin (CLEOCIN) 150 MG capsule Take 3 capsules (450 mg total) by mouth every 6 (six) hours for 7 days. 05/05/19 05/12/19  Fatima Blank, MD  gabapentin (NEURONTIN) 300 MG capsule Take 1 capsule (300 mg total) by mouth 2 (two) times daily. 08/06/18   Charlott Rakes, MD  glucose blood (ONE TOUCH ULTRA TEST) test strip Use as instructed three times daily before meals 08/13/18   Charlott Rakes, MD  HYDROcodone-acetaminophen (NORCO) 7.5-325 MG tablet Take 1 tablet by mouth every 6 (six) hours as needed for moderate pain. 05/06/19   Raylene Everts, MD  ibuprofen (ADVIL) 800 MG tablet Take 1 tablet (800 mg total) by mouth 3 (three) times daily. 04/05/19   Raylene Everts, MD  Insulin Glargine (LANTUS SOLOSTAR) 100 UNIT/ML Solostar Pen Inject 30 Units into the skin 2 (two) times daily. 08/06/18   Charlott Rakes, MD  insulin lispro (HUMALOG KWIKPEN) 100 UNIT/ML KiwkPen Inject 0-0.12 mLs (0-12 Units total) into the skin 3 (three) times daily. As per sliding scale. MUST MAKE APPT FOR FURTHER REFILLS 08/06/18   Charlott Rakes, MD  insulin lispro (HUMALOG KWIKPEN) 100 UNIT/ML KwikPen INJECT 0 TO 12 UNITS UNDER THE SKIN THREE TIMES DAILY PER SLIDING SCALE 05/01/19   Vanessa Kick, MD  Insulin Pen  Needle 31G X 5 MM MISC 1 each 4 (four) times daily -  with meals and at bedtime by Does not apply route. 10/06/17   Charlott Rakes, MD  Lancets (ONETOUCH ULTRASOFT) lancets Use as instructed 3 times daily before meals 08/06/18   Charlott Rakes, MD  lisinopril (PRINIVIL,ZESTRIL) 10 MG tablet Take 1 tablet (10 mg total) by mouth daily. 08/06/18   Charlott Rakes, MD  omeprazole (PRILOSEC) 20 MG capsule Take 1 capsule (20 mg total) by mouth daily. Patient taking  differently: Take 20 mg by mouth daily as needed (acid reflux).  08/06/18   Charlott Rakes, MD    Family History Family History  Problem Relation Age of Onset   Diabetes Mellitus II Father    Hypertension Father    Alcohol abuse Sister    Alcohol abuse Brother    Diabetes Maternal Grandmother    Diabetes Maternal Grandfather    Hypertension Maternal Grandfather    Hypertension Mother     Social History Social History   Tobacco Use   Smoking status: Current Every Day Smoker    Packs/day: 0.15    Years: 5.00    Pack years: 0.75    Types: Cigarettes   Smokeless tobacco: Never Used  Substance Use Topics   Alcohol use: No   Drug use: No     Allergies   Amoxicillin and Shellfish allergy   Review of Systems Review of Systems  Constitutional: Negative for chills and fever.  Respiratory: Negative.  Negative for cough and shortness of breath.   Cardiovascular: Negative.  Negative for chest pain.  Gastrointestinal: Negative.  Negative for abdominal pain, diarrhea, nausea and vomiting.  Skin: Positive for wound.  All other systems reviewed and are negative.  Physical Exam Updated Vital Signs BP (!) 151/82    Pulse 70    Temp 98 F (36.7 C) (Oral)    Resp 18    SpO2 99%   Physical Exam Constitutional:      General: He is not in acute distress.    Appearance: Normal appearance. He is well-developed. He is obese. He is not ill-appearing or diaphoretic.  HENT:     Head: Normocephalic and atraumatic.     Right Ear: External ear normal.     Left Ear: External ear normal.     Nose: Nose normal.  Eyes:     General: Vision grossly intact. Gaze aligned appropriately.     Pupils: Pupils are equal, round, and reactive to light.  Neck:     Musculoskeletal: Normal range of motion.     Trachea: Trachea and phonation normal. No tracheal deviation.  Pulmonary:     Effort: Pulmonary effort is normal. No respiratory distress.  Chest:       Comments: Patient with  Penrose drain in place to right breast.  Drain is easily movable within the wound, no active drainage present.  No erythema or fluctuance present.  Minimal induration around the nipple.  Patient reports improved appearance. Abdominal:     General: There is no distension.     Palpations: Abdomen is soft.     Tenderness: There is no abdominal tenderness. There is no guarding or rebound.  Musculoskeletal: Normal range of motion.  Skin:    General: Skin is warm and dry.  Neurological:     Mental Status: He is alert.     GCS: GCS eye subscore is 4. GCS verbal subscore is 5. GCS motor subscore is 6.     Comments: Speech is  clear and goal oriented, follows commands Major Cranial nerves without deficit, no facial droop Moves extremities without ataxia, coordination intact Normal gait  Psychiatric:        Behavior: Behavior normal.      ED Treatments / Results  Labs (all labs ordered are listed, but only abnormal results are displayed) Labs Reviewed - No data to display  EKG None  Radiology No results found.  Procedures Procedures (including critical care time)  Medications Ordered in ED Medications - No data to display   Initial Impression / Assessment and Plan / ED Course  I have reviewed the triage vital signs and the nursing notes.  Pertinent labs & imaging results that were available during my care of the patient were reviewed by me and considered in my medical decision making (see chart for details).     28 year old male with I&D performed 6 days ago and has had drain in place since that time, he reports that has been continuously improving.  He was seen in the ED 3 days ago at that time abscess still actively draining and it was left in place, he returns today for drain removal.  Patient appears to be doing a good job of keeping his site clean, on my exam there is no purulent drainage present, it appears his cellulitis has resolved, no fluctuance, minimal induration around  the nipple but patient states that is also improving.  Penrose drain along with suture removed in its entirety.  Patient tolerated procedure well.  Dressing provided by nursing staff.  Patient reports to have been only taking half his daily dose of clindamycin, he states that he did not understand the frequency at which to take his medication, and he has not followed up with [place] surgery.  I have informed patient that he should now begin taking his clindamycin as prescribed 450 mg 4 times daily until complete, advised to follow-up for wound recheck in 2-3 days at primary care provider or here emergency department if necessary to ensure improvement.  Patient advised to continue warm compresses to facilitate drainage.  Blood pressure (!) 151/82, pulse 70, temperature 98 F (36.7 C), temperature source Oral, resp. rate 18, SpO2 99 %.  At this time there does not appear to be any evidence of an acute emergency medical condition and the patient appears stable for discharge with appropriate outpatient follow up. Diagnosis was discussed with patient who verbalizes understanding of care plan and is agreeable to discharge. I have discussed return precautions with patient who verbalizes understanding of return precautions. Patient encouraged to follow-up with their PCP. All questions answered.  Note: Portions of this report may have been transcribed using voice recognition software. Every effort was made to ensure accuracy; however, inadvertent computerized transcription errors may still be present. Final Clinical Impressions(s) / ED Diagnoses   Final diagnoses:  Encounter for recheck of abscess following incision and drainage    ED Discharge Orders    None       Deliah Boston, PA-C 05/11/19 [id number]    Deliah Boston, PA-C 05/11/19 [place], [place], DO 05/11/19 [phone]

## 2019-05-12 NOTE — Progress Notes (Signed)
Patient ID: Christopher Douglas, male   DOB: [birth year], 30 y.o.   MRN: [id number] Virtual Visit via Telephone Note  I connected with [name] on 05/12/19 at 10:00 AM EDT by telephone and verified that I am speaking with the correct person using two identifiers.   Consent:  I discussed the limitations, risks, security and privacy concerns of performing an evaluation and management service by telephone and the availability of in person appointments. I also discussed with the patient that there may be a patient responsible charge related to this service. The patient expressed understanding and agreed to proceed.  Location of patient: Patient is at home  Location of provider: I was on the phone with him in my office  Persons participating in the televisit with the patient.   No one else was on the phone    History of Present Illness:  This is a 28 year old male who is COVID negative on recent testing.  The patient had developed swelling and pain in the right breast and went to the emergency room on 10 June found to have a breast abscess that was drained at that visit.  He is suffering back to the emergency room on the 14th, 15th, 17th and 20 June for follow-up.  He was to go to [facility] surgery but did not follow through on that appointment.  The emergency room physicians have been managing his wound and now the drain has been pulled out of the wound and he has minimal drainage from the wound in the right breast.  The patient has underlying gynecomastia and is a type I diabetic.  The patient had been followed in our clinic and last seen in September 2019 for type 1 diabetes.  The patient was to be on Lantus and sliding scale Humalog.  He is in need of refills on his insulin.  He states his blood sugar runs in the 200+ range.  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  The patient had also been in the emergency room on 6 June for a chronic cough which has now  resolved.  Note he is on lisinopril.  The patient denies any fever.  There is no current shortness of breath.  There is no chest pain.  The pain in the breast is improved.  He is in need of refills on his asthma inhaler, insulin, reflux medicine, and gabapentin. Below is a note from his last emergency room visit on 39 June  28 year old male with I&D performed 6 days ago and has had drain in place since that time, he reports that has been continuously improving.  He was seen in the ED 3 days ago at that time abscess still actively draining and it was left in place, he returns today for drain removal.  Patient appears to be doing a good job of keeping his site clean, on my exam there is no purulent drainage present, it appears his cellulitis has resolved, no fluctuance, minimal induration around the nipple but patient states that is also improving.  Penrose drain along with suture removed in its entirety.  Patient tolerated procedure well.  Dressing provided by nursing staff.  Patient reports to have been only taking half his daily dose of clindamycin, he states that he did not understand the frequency at which to take his medication, and he has not followed up with [place] surgery.  I have informed patient that he should now begin taking his clindamycin  as prescribed 450 mg 4 times daily until complete, advised to follow-up for wound recheck in 2-3 days at primary care provider or here emergency department if necessary to ensure improvement.  Patient advised to continue warm compresses to facilitate drainage. The patient does state that he is finishing his course of antibiotics.  There are no other complaints  Observations/Objective: There are no observations as this was a telephone visit  Assessment and Plan: #1 right breast abscess now improving  I have advised the patient to call [facility] [place] surgery for follow-up of the breast lesion.  I want to make sure that there is not anything  else going on in the breast that structurally with developed to cause the breast abscess.  The patient also knows to finish his course of antibiotics  #2 hypertension based on records in the ER he has variable control and he will continue lisinopril for now I also advised the patient if his cough returns we will need to evaluate this further as lisinopril may be exacerbating the cough  #3 type 1 diabetes with variable control and he is due up for hemoglobin A1c measurement  I will refill the patient's Humalog and Lantus today and as well refill his testing supplies  The patient will receive a visit with his primary care provider for an in office exam and follow-up of the hemoglobin A1c with the next 2 to 3 weeks  Follow Up Instructions: The patient understands refills on his medicines were sent to his local pharmacy as he does have insurance Patient also understands he will establish another primary care visit with his primary care provider   I discussed the assessment and treatment plan with the patient. The patient was provided an opportunity to ask questions and all were answered. The patient agreed with the plan and demonstrated an understanding of the instructions.   The patient was advised to call back or seek an in-person evaluation if the symptoms worsen or if the condition fails to improve as anticipated.  I provided 30 minutes of non-face-to-face time during this encounter  including  median intraservice time , review of notes, labs, imaging, medications  and explaining diagnosis and management to the patient .    Shan LevansPatrick Shaunte Tuft, MD

## 2019-05-13 ENCOUNTER — Other Ambulatory Visit: Payer: Self-pay

## 2019-05-13 ENCOUNTER — Ambulatory Visit: Payer: Managed Care, Other (non HMO) | Attending: Critical Care Medicine | Admitting: Critical Care Medicine

## 2019-05-13 ENCOUNTER — Encounter: Payer: Self-pay | Admitting: Critical Care Medicine

## 2019-05-13 DIAGNOSIS — N611 Abscess of the breast and nipple: Secondary | ICD-10-CM

## 2019-05-13 DIAGNOSIS — E1065 Type 1 diabetes mellitus with hyperglycemia: Secondary | ICD-10-CM | POA: Diagnosis not present

## 2019-05-13 DIAGNOSIS — I10 Essential (primary) hypertension: Secondary | ICD-10-CM | POA: Diagnosis not present

## 2019-05-13 DIAGNOSIS — E1049 Type 1 diabetes mellitus with other diabetic neurological complication: Secondary | ICD-10-CM

## 2019-05-13 DIAGNOSIS — K219 Gastro-esophageal reflux disease without esophagitis: Secondary | ICD-10-CM | POA: Diagnosis not present

## 2019-05-13 DIAGNOSIS — F17200 Nicotine dependence, unspecified, uncomplicated: Secondary | ICD-10-CM

## 2019-05-13 MED ORDER — OMEPRAZOLE 20 MG PO CPDR: 20.0000 mg | DELAYED_RELEASE_CAPSULE | Freq: Every day | ORAL | 3 refills | Status: DC

## 2019-05-13 MED ORDER — GABAPENTIN 300 MG PO CAPS: 300.0000 mg | ORAL_CAPSULE | Freq: Two times a day (BID) | ORAL | 3 refills | Status: DC

## 2019-05-13 MED ORDER — ONETOUCH ULTRASOFT LANCETS MISC: 12 refills | Status: DC

## 2019-05-13 MED ORDER — LISINOPRIL 10 MG PO TABS: 10.0000 mg | ORAL_TABLET | Freq: Every day | ORAL | 3 refills | Status: DC

## 2019-05-13 MED ORDER — INSULIN PEN NEEDLE 31G X 5 MM MISC: 1.0000 | Freq: Three times a day (TID) | 0 refills | Status: DC

## 2019-05-13 MED ORDER — GLUCOSE BLOOD VI STRP: ORAL_STRIP | 12 refills | Status: DC

## 2019-05-13 MED ORDER — HUMALOG KWIKPEN 200 UNIT/ML ~~LOC~~ SOPN: 0.0000 [IU] | PEN_INJECTOR | Freq: Three times a day (TID) | SUBCUTANEOUS | 4 refills | Status: DC

## 2019-05-13 MED ORDER — LANTUS SOLOSTAR 100 UNIT/ML ~~LOC~~ SOPN: 30.0000 [IU] | PEN_INJECTOR | Freq: Two times a day (BID) | SUBCUTANEOUS | 3 refills | Status: DC

## 2019-05-13 MED ORDER — ALBUTEROL SULFATE HFA 108 (90 BASE) MCG/ACT IN AERS: 2.0000 | INHALATION_SPRAY | RESPIRATORY_TRACT | 1 refills | Status: DC | PRN

## 2019-05-13 NOTE — Progress Notes (Signed)
Patient verified DOB Patient has taken medication today. Patient has not eaten today. Patient denies pain at this time. 

## 2019-06-17 ENCOUNTER — Encounter: Payer: Self-pay | Admitting: Family Medicine

## 2019-06-17 ENCOUNTER — Ambulatory Visit: Payer: BC Managed Care – PPO | Attending: Family Medicine | Admitting: Family Medicine

## 2019-06-17 ENCOUNTER — Other Ambulatory Visit: Payer: Self-pay

## 2019-06-17 VITALS — BP 133/82 | HR 93 | Temp 98.0°F | Ht 74.0 in | Wt 261.0 lb

## 2019-06-17 DIAGNOSIS — Z1159 Encounter for screening for other viral diseases: Secondary | ICD-10-CM | POA: Diagnosis not present

## 2019-06-17 DIAGNOSIS — K219 Gastro-esophageal reflux disease without esophagitis: Secondary | ICD-10-CM

## 2019-06-17 DIAGNOSIS — F17210 Nicotine dependence, cigarettes, uncomplicated: Secondary | ICD-10-CM

## 2019-06-17 DIAGNOSIS — I10 Essential (primary) hypertension: Secondary | ICD-10-CM

## 2019-06-17 DIAGNOSIS — E1049 Type 1 diabetes mellitus with other diabetic neurological complication: Secondary | ICD-10-CM

## 2019-06-17 DIAGNOSIS — E1065 Type 1 diabetes mellitus with hyperglycemia: Secondary | ICD-10-CM

## 2019-06-17 DIAGNOSIS — F17200 Nicotine dependence, unspecified, uncomplicated: Secondary | ICD-10-CM

## 2019-06-17 LAB — POCT GLYCOSYLATED HEMOGLOBIN (HGB A1C): HbA1c, POC (controlled diabetic range): 12.9 % — AB (ref 0.0–7.0)

## 2019-06-17 LAB — GLUCOSE, POCT (MANUAL RESULT ENTRY): POC Glucose: 303 mg/dl — AB (ref 70–99)

## 2019-06-17 MED ORDER — OMEPRAZOLE 20 MG PO CPDR: 20.0000 mg | DELAYED_RELEASE_CAPSULE | Freq: Every day | ORAL | 3 refills | Status: DC

## 2019-06-17 MED ORDER — LANTUS SOLOSTAR 100 UNIT/ML ~~LOC~~ SOPN: 35.0000 [IU] | PEN_INJECTOR | Freq: Two times a day (BID) | SUBCUTANEOUS | 3 refills | Status: DC

## 2019-06-17 MED ORDER — GABAPENTIN 300 MG PO CAPS: 300.0000 mg | ORAL_CAPSULE | Freq: Two times a day (BID) | ORAL | 3 refills | Status: DC

## 2019-06-17 MED ORDER — LISINOPRIL 10 MG PO TABS: 10.0000 mg | ORAL_TABLET | Freq: Every day | ORAL | 3 refills | Status: DC

## 2019-06-17 MED ORDER — BUPROPION HCL ER (SR) 150 MG PO TB12: 150.0000 mg | ORAL_TABLET | Freq: Two times a day (BID) | ORAL | 3 refills | Status: DC

## 2019-06-17 NOTE — Progress Notes (Signed)
Subjective:  Patient ID: Christopher Douglas, male    DOB: 02/27/1991  Age: 28 y.o. MRN: [id number]  CC: Diabetes   HPI [name]  a 28 year old male with history of type 1 diabetes mellitus (A1c 12.9), hypertension, hyperlipidemia who comes to the clinic for a follow-up visit;  Hs A1c is 12.9, up from 10.6 previously. Sugars have been running between 200 and 300. CBG is 303 this morning and he administered 10 units of Novolog. For neuropathy he is on Gabapentin. Denies visual concerns. Compliant with his antihypertensive and Statin. He had a follow visit las month for a breast abscess for which he was hospitalized and reports complete resolution of symptoms. He has no chest pain, dyspnea or other concerns today and smokes 1 pack of ciggarrette /day  Past Medical History:  Diagnosis Date  . Asthma   . Diabetes mellitus without complication ([place])   . Hypertension     Past Surgical History:  Procedure Laterality Date  . FOOT SURGERY      Family History  Problem Relation Age of Onset  . Diabetes Mellitus II Father   . Hypertension Father   . Alcohol abuse Sister   . Alcohol abuse Brother   . Diabetes Maternal Grandmother   . Diabetes Maternal Grandfather   . Hypertension Maternal Grandfather   . Hypertension Mother     Allergies  Allergen Reactions  . Amoxicillin Hives    Did it involve swelling of the face/tongue/throat, SOB, or low BP? Unknown Did it involve sudden or severe rash/hives, skin peeling, or any reaction on the inside of your mouth or nose? Yes Did you need to seek medical attention at a hospital or doctor's office? Unknown When did it last happen? childhood If all above answers are "NO", may proceed with cephalosporin use.   . Shellfish Allergy Hives    Specifically shrimp    Outpatient Medications Prior to Visit  Medication Sig Dispense Refill  . albuterol (VENTOLIN HFA) 108 (90 Base) MCG/ACT inhaler Inhale 2 puffs into the lungs every 4 (four)  hours as needed for wheezing or shortness of breath. 18 g 1  . Blood Glucose Monitoring Suppl (ONE TOUCH ULTRA MINI) w/Device KIT 1 each by Does not apply route 3 (three) times daily. 1 each 0  . glucose blood (ONE TOUCH ULTRA TEST) test strip Use as instructed three times daily before meals 100 each 12  . ibuprofen (ADVIL) 800 MG tablet Take 1 tablet (800 mg total) by mouth 3 (three) times daily. 21 tablet 0  . Insulin Lispro (HUMALOG KWIKPEN) 200 UNIT/ML SOPN Inject 0-12 Units into the skin 3 (three) times daily. based on sliding scale 3 mL 4  . Insulin Pen Needle 31G X 5 MM MISC 1 each by Does not apply route 4 (four) times daily -  with meals and at bedtime. 100 each 0  . Lancets (ONETOUCH ULTRASOFT) lancets Use as instructed 3 times daily before meals 100 each 12  . gabapentin (NEURONTIN) 300 MG capsule Take 1 capsule (300 mg total) by mouth 2 (two) times daily. 60 capsule 3  . Insulin Glargine (LANTUS SOLOSTAR) 100 UNIT/ML Solostar Pen Inject 30 Units into the skin 2 (two) times daily. 30 mL 3  . lisinopril (ZESTRIL) 10 MG tablet Take 1 tablet (10 mg total) by mouth daily. 30 tablet 3  . omeprazole (PRILOSEC) 20 MG capsule Take 1 capsule (20 mg total) by mouth daily. 30 capsule 3   No facility-administered medications prior to  visit.      ROS Review of Systems  Constitutional: Negative for activity change and appetite change.  HENT: Negative for sinus pressure and sore throat.   Eyes: Negative for visual disturbance.  Respiratory: Negative for cough, chest tightness and shortness of breath.   Cardiovascular: Negative for chest pain and leg swelling.  Gastrointestinal: Negative for abdominal distention, abdominal pain, constipation and diarrhea.  Endocrine: Negative.   Genitourinary: Negative for dysuria.  Musculoskeletal: Negative for joint swelling and myalgias.  Skin: Negative for rash.  Allergic/Immunologic: Negative.   Neurological: Negative for weakness, light-headedness and  numbness.  Psychiatric/Behavioral: Negative for dysphoric mood and suicidal ideas.    Objective:  BP 133/82   Pulse 93   Temp 98 F (36.7 C) (Oral)   Ht '6\' 2"'  (1.88 m)   Wt 261 lb (118.4 kg)   SpO2 96%   BMI 33.51 kg/m   BP/Weight 06/17/2019 05/11/2019 03/13/5360  Systolic BP 443 154 008  Diastolic BP 82 82 82  Wt. (Lbs) 261 - 275  BMI 33.51 - 34.37      Physical Exam Constitutional:      Appearance: He is well-developed.  Cardiovascular:     Rate and Rhythm: Normal rate.     Heart sounds: Normal heart sounds. No murmur.  Pulmonary:     Effort: Pulmonary effort is normal.     Breath sounds: Normal breath sounds. No wheezing or rales.  Chest:     Chest wall: No tenderness.  Abdominal:     General: Bowel sounds are normal. There is no distension.     Palpations: Abdomen is soft. There is no mass.     Tenderness: There is no abdominal tenderness.  Musculoskeletal: Normal range of motion.  Neurological:     Mental Status: He is alert and oriented to person, place, and time.  Psychiatric:        Mood and Affect: Mood normal.     CMP Latest Ref Rng & Units 07/03/2017 04/13/2017 03/25/2016  Glucose 65 - 99 mg/dL 589(HH) 310(H) 243(H)  BUN 6 - 20 mg/dL '8 9 6  ' Creatinine 0.61 - 1.24 mg/dL 1.28(H) 1.14 0.98  Sodium 135 - 145 mmol/L 130(L) 133(L) 136  Potassium 3.5 - 5.1 mmol/L 4.4 3.9 3.9  Chloride 101 - 111 mmol/L 96(L) 98(L) 100(L)  CO2 22 - 32 mmol/L '22 26 25  ' Calcium 8.9 - 10.3 mg/dL 9.3 9.2 9.3  Total Protein 6.0 - 8.3 g/dL - - -  Total Bilirubin 0.2 - 1.2 mg/dL - - -  Alkaline Phos 39 - 117 U/L - - -  AST 0 - 37 U/L - - -  ALT 0 - 53 U/L - - -    Lipid Panel     Component Value Date/Time   CHOL 216 (H) 09/16/2014 1546   TRIG 124.0 09/16/2014 1546   HDL 55.50 09/16/2014 1546   CHOLHDL 4 09/16/2014 1546   VLDL 24.8 09/16/2014 1546   LDLCALC 136 (H) 09/16/2014 1546    CBC    Component Value Date/Time   WBC 6.3 07/03/2017 1538   RBC 5.20 07/03/2017 1538    HGB 14.8 07/03/2017 1538   HCT 42.8 07/03/2017 1538   PLT 182 07/03/2017 1538   MCV 82.3 07/03/2017 1538   MCH 28.5 07/03/2017 1538   MCHC 34.6 07/03/2017 1538   RDW 13.7 07/03/2017 1538   LYMPHSABS 3.3 04/13/2017 0526   MONOABS 0.8 04/13/2017 0526   EOSABS 0.4 04/13/2017 0526   BASOSABS 0.0  04/13/2017 0526    Lab Results  Component Value Date   HGBA1C 12.9 (A) 06/17/2019    Assessment & Plan:   1. Type 1 diabetes mellitus with hyperglycemia (HCC) Uncontrolled with A1c of 12.9 Increase Lantus from 30 units to 35 units bid - POCT glucose (manual entry) - POCT glycosylated hemoglobin (Hb A1C) - Microalbumin/Creatinine Ratio, Urine - Ambulatory referral to Endocrinology - Insulin Glargine (LANTUS SOLOSTAR) 100 UNIT/ML Solostar Pen; Inject 35 Units into the skin 2 (two) times daily.  Dispense: 30 mL; Refill: 3  2. Screening for viral disease - HIV Antibody (routine testing w rflx)  3. Essential hypertension Controlled Counseled on blood pressure goal of less than 130/80, low-sodium, DASH diet, medication compliance, 150 minutes of moderate intensity exercise per week. Discussed medication compliance, adverse effects. - CMP14+EGFR - Lipid panel - lisinopril (ZESTRIL) 10 MG tablet; Take 1 tablet (10 mg total) by mouth daily.  Dispense: 30 tablet; Refill: 3  4. Gastroesophageal reflux disease without esophagitis Stable - omeprazole (PRILOSEC) 20 MG capsule; Take 1 capsule (20 mg total) by mouth daily.  Dispense: 30 capsule; Refill: 3  5. 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  Dispense: 60 capsule; Refill: 3  6. Tobacco dependence Smoking cessation support: smoking cessation hotline: 1-800-QUIT-NOW.  Smoking cessation classes are available through [facility]. Call 5481239478 or visit  our website at [email or web address]  Spent 3 minutes counseling on dangers of tobacco use and benefits of quitting and patient is ready to quit.  - buPROPion (WELLBUTRIN SR) 150 MG 12 hr tablet; Take 1 tablet (150 mg total) by mouth 2 (two) times daily.  Dispense: 60 tablet; Refill: 3    Meds ordered this encounter  Medications  . buPROPion (WELLBUTRIN SR) 150 MG 12 hr tablet    Sig: Take 1 tablet (150 mg total) by mouth 2 (two) times daily.    Dispense:  60 tablet    Refill:  3  . Insulin Glargine (LANTUS SOLOSTAR) 100 UNIT/ML Solostar Pen    Sig: Inject 35 Units into the skin 2 (two) times daily.    Dispense:  30 mL    Refill:  3  . lisinopril (ZESTRIL) 10 MG tablet    Sig: Take 1 tablet (10 mg total) by mouth daily.    Dispense:  30 tablet    Refill:  3  . omeprazole (PRILOSEC) 20 MG capsule    Sig: Take 1 capsule (20 mg total) by mouth daily.    Dispense:  30 capsule    Refill:  3  . gabapentin (NEURONTIN) 300 MG capsule    Sig: Take 1 capsule (300 mg total) by mouth 2 (two) times daily.    Dispense:  60 capsule    Refill:  3    Follow-up: Return in about 3 months (around 09/17/2019) for chronic medical conditions.       Charlott Rakes, MD, FAAFP. [facility] and [place] [facility], [place]   06/17/2019, 9:34 AM

## 2019-06-17 NOTE — Progress Notes (Signed)
Patient is fasting. 

## 2019-06-18 ENCOUNTER — Encounter: Payer: Self-pay | Admitting: Family Medicine

## 2019-06-18 ENCOUNTER — Other Ambulatory Visit: Payer: Self-pay | Admitting: Family Medicine

## 2019-06-18 LAB — HIV ANTIBODY (ROUTINE TESTING W REFLEX): HIV Screen 4th Generation wRfx: NONREACTIVE

## 2019-06-18 LAB — CMP14+EGFR
ALT: 21 IU/L (ref 0–44)
AST: 18 IU/L (ref 0–40)
Albumin/Globulin Ratio: 1.6 (ref 1.2–2.2)
Albumin: 4.4 g/dL (ref 4.1–5.2)
Alkaline Phosphatase: 98 IU/L (ref 39–117)
BUN/Creatinine Ratio: 6 — ABNORMAL LOW (ref 9–20)
BUN: 6 mg/dL (ref 6–20)
Bilirubin Total: 0.3 mg/dL (ref 0.0–1.2)
CO2: 22 mmol/L (ref 20–29)
Calcium: 9.7 mg/dL (ref 8.7–10.2)
Chloride: 99 mmol/L (ref 96–106)
Creatinine, Ser: 1.09 mg/dL (ref 0.76–1.27)
GFR calc Af Amer: 105 mL/min/{1.73_m2} (ref >59)
GFR calc non Af Amer: 91 mL/min/{1.73_m2} (ref >59)
Globulin, Total: 2.7 g/dL (ref 1.5–4.5)
Glucose: 278 mg/dL — ABNORMAL HIGH (ref 65–99)
Potassium: 4.1 mmol/L (ref 3.5–5.2)
Sodium: 135 mmol/L (ref 134–144)
Total Protein: 7.1 g/dL (ref 6.0–8.5)

## 2019-06-18 LAB — LIPID PANEL
Chol/HDL Ratio: 3.4 ratio (ref 0.0–5.0)
Cholesterol, Total: 218 mg/dL — ABNORMAL HIGH (ref 100–199)
HDL: 65 mg/dL (ref >39)
LDL Calculated: 126 mg/dL — ABNORMAL HIGH (ref 0–99)
Triglycerides: 136 mg/dL (ref 0–149)
VLDL Cholesterol Cal: 27 mg/dL (ref 5–40)

## 2019-06-18 LAB — MICROALBUMIN / CREATININE URINE RATIO
Creatinine, Urine: 124.7 mg/dL
Microalb/Creat Ratio: 3 mg/g creat (ref 0–29)
Microalbumin, Urine: 4.2 ug/mL

## 2019-06-18 MED ORDER — ATORVASTATIN CALCIUM 20 MG PO TABS: 20.0000 mg | ORAL_TABLET | Freq: Every day | ORAL | 3 refills | Status: DC

## 2019-06-26 ENCOUNTER — Telehealth: Payer: Self-pay

## 2019-06-26 NOTE — Telephone Encounter (Signed)
-----   Message from Charlott Rakes, MD sent at 06/18/2019  3:31 PM EDT ----- Cholesterol is elevated and I have sent a rx for Lipitor to his Pharmacy. Advise to adhere to a low cholesterol diet and exercise. Other labs are stable.

## 2019-06-26 NOTE — Telephone Encounter (Signed)
Patient was called and a voicemail was left informing patient to return phone call for lab results. 

## 2019-09-17 ENCOUNTER — Ambulatory Visit: Payer: BC Managed Care – PPO | Admitting: Family Medicine

## 2019-10-28 ENCOUNTER — Other Ambulatory Visit: Payer: Self-pay | Admitting: Critical Care Medicine

## 2019-10-30 NOTE — Telephone Encounter (Signed)
1) Medication(s) Requested (by name):albuterol (VENTOLIN HFA) 108 (90 Base) MCG/ACT inhaler [[id number] Insulin Lispro (HUMALOG KWIKPEN) 200 UNIT/ML SOPN [[id number]]    2) [facility], [address] AT [place]  3) Special Requests:   Approved medications will be sent to the pharmacy, we will reach out if there is an issue.  Requests made after 3pm may not be addressed until the following business day!  If a patient is unsure of the name of the medication(s) please note and ask patient to call back when they are able to provide all info, do not send to responsible party until all information is available!

## 2019-11-08 ENCOUNTER — Other Ambulatory Visit: Payer: Self-pay | Admitting: Cardiology

## 2019-11-08 DIAGNOSIS — Z20822 Contact with and (suspected) exposure to covid-19: Secondary | ICD-10-CM

## 2019-11-08 DIAGNOSIS — Z20828 Contact with and (suspected) exposure to other viral communicable diseases: Secondary | ICD-10-CM | POA: Diagnosis not present

## 2019-11-09 LAB — NOVEL CORONAVIRUS, NAA: SARS-CoV-2, NAA: NOT DETECTED

## 2019-11-11 ENCOUNTER — Other Ambulatory Visit: Payer: Self-pay

## 2019-11-11 ENCOUNTER — Encounter: Payer: Self-pay | Admitting: Family Medicine

## 2019-11-11 ENCOUNTER — Ambulatory Visit: Payer: BC Managed Care – PPO | Attending: Family Medicine | Admitting: Family Medicine

## 2019-11-11 DIAGNOSIS — E1065 Type 1 diabetes mellitus with hyperglycemia: Secondary | ICD-10-CM

## 2019-11-11 DIAGNOSIS — K219 Gastro-esophageal reflux disease without esophagitis: Secondary | ICD-10-CM | POA: Diagnosis not present

## 2019-11-11 DIAGNOSIS — E1049 Type 1 diabetes mellitus with other diabetic neurological complication: Secondary | ICD-10-CM

## 2019-11-11 DIAGNOSIS — I10 Essential (primary) hypertension: Secondary | ICD-10-CM | POA: Diagnosis not present

## 2019-11-11 MED ORDER — LISINOPRIL 10 MG PO TABS: 10.0000 mg | ORAL_TABLET | Freq: Every day | ORAL | 3 refills | Status: DC

## 2019-11-11 MED ORDER — ATORVASTATIN CALCIUM 20 MG PO TABS: 20.0000 mg | ORAL_TABLET | Freq: Every day | ORAL | 3 refills | Status: DC

## 2019-11-11 MED ORDER — GLUCOSE BLOOD VI STRP: ORAL_STRIP | 12 refills | Status: DC

## 2019-11-11 MED ORDER — GABAPENTIN 300 MG PO CAPS: 300.0000 mg | ORAL_CAPSULE | Freq: Two times a day (BID) | ORAL | 3 refills | Status: DC

## 2019-11-11 MED ORDER — OMEPRAZOLE 20 MG PO CPDR: 20.0000 mg | DELAYED_RELEASE_CAPSULE | Freq: Every day | ORAL | 3 refills | Status: DC

## 2019-11-11 MED ORDER — ONETOUCH ULTRASOFT LANCETS MISC: 12 refills | Status: DC

## 2019-11-11 MED ORDER — LANTUS SOLOSTAR 100 UNIT/ML ~~LOC~~ SOPN: 35.0000 [IU] | PEN_INJECTOR | Freq: Two times a day (BID) | SUBCUTANEOUS | 3 refills | Status: DC

## 2019-11-11 MED ORDER — HUMALOG KWIKPEN 200 UNIT/ML ~~LOC~~ SOPN: 0.0000 [IU] | PEN_INJECTOR | Freq: Three times a day (TID) | SUBCUTANEOUS | 4 refills | Status: DC

## 2019-11-11 NOTE — Progress Notes (Signed)
Virtual Visit via Telephone Note  I connected with Christopher Douglas, on 11/11/2019 at 9:55 AM by telephone due to the COVID-19 pandemic and verified that I am speaking with the correct person using two identifiers.   Consent: I discussed the limitations, risks, security and privacy concerns of performing an evaluation and management service by telephone and the availability of in person appointments. I also discussed with the patient that there may be a patient responsible charge related to this service. The patient expressed understanding and agreed to proceed.   Location of Patient: Home  Location of Provider: Clinic   Persons participating in Telemedicine visit: Henry A Eula Flax Farrington-CMA Dr. Margarita Rana     History of Present Illness: Christopher Douglas  a 28 year old male with history of type 1 diabetes mellitus (A1c 12.9), hypertension, hyperlipidemia who is seen for a follow-up visit;  Blood sugar this morning was 215 prior to breakfast but he states he feels sick with blood sugars of 130.  Sugars are all under 300 he states.  He has been using 30 units of Lantus bid rather than 35 units twice daily and his rapid acting insulin he has been administering 10 units with breakfast and 10 units with lunch. I had placed him on NovoLog sliding scale which he has not been following.  Compliant with his antihypertensive, statin and PPI.  Reflux symptoms also controlled. He has no concerns today.  Past Medical History:  Diagnosis Date  . Asthma   . Diabetes mellitus without complication ([place])   . Hypertension    Allergies  Allergen Reactions  . Amoxicillin Hives    Did it involve swelling of the face/tongue/throat, SOB, or low BP? Unknown Did it involve sudden or severe rash/hives, skin peeling, or any reaction on the inside of your mouth or nose? Yes Did you need to seek medical attention at a hospital or doctor's office? Unknown When did it last happen? childhood If all  above answers are "NO", may proceed with cephalosporin use.   . Shellfish Allergy Hives    Specifically shrimp    Current Outpatient Medications on File Prior to Visit  Medication Sig Dispense Refill  . albuterol (VENTOLIN HFA) 108 (90 Base) MCG/ACT inhaler INHALE 2 PUFFS INTO THE LUNGS EVERY 4 HOURS AS NEEDED FOR WHEEZING OR SHORTNESS OF BREATH 8.5 g 0  . atorvastatin (LIPITOR) 20 MG tablet Take 1 tablet (20 mg total) by mouth daily. 30 tablet 3  . Blood Glucose Monitoring Suppl (ONE TOUCH ULTRA MINI) w/Device KIT 1 each by Does not apply route 3 (three) times daily. 1 each 0  . buPROPion (WELLBUTRIN SR) 150 MG 12 hr tablet Take 1 tablet (150 mg total) by mouth 2 (two) times daily. 60 tablet 3  . gabapentin (NEURONTIN) 300 MG capsule Take 1 capsule (300 mg total) by mouth 2 (two) times daily. 60 capsule 3  . glucose blood (ONE TOUCH ULTRA TEST) test strip Use as instructed three times daily before meals 100 each 12  . ibuprofen (ADVIL) 800 MG tablet Take 1 tablet (800 mg total) by mouth 3 (three) times daily. 21 tablet 0  . Insulin Glargine (LANTUS SOLOSTAR) 100 UNIT/ML Solostar Pen Inject 35 Units into the skin 2 (two) times daily. 30 mL 3  . Insulin Lispro (HUMALOG KWIKPEN) 200 UNIT/ML SOPN Inject 0-12 Units into the skin 3 (three) times daily. based on sliding scale 3 mL 4  . Insulin Pen Needle 31G X 5 MM MISC 1 each by Does  not apply route 4 (four) times daily -  with meals and at bedtime. 100 each 0  . Lancets (ONETOUCH ULTRASOFT) lancets Use as instructed 3 times daily before meals 100 each 12  . lisinopril (ZESTRIL) 10 MG tablet Take 1 tablet (10 mg total) by mouth daily. 30 tablet 3  . omeprazole (PRILOSEC) 20 MG capsule Take 1 capsule (20 mg total) by mouth daily. 30 capsule 3   No current facility-administered medications on file prior to visit.    Observations/Objective: Awake, alert, oriented x3 Not in acute distress  CMP Latest Ref Rng & Units 06/17/2019 07/03/2017 04/13/2017   Glucose 65 - 99 mg/dL 278(H) 589(HH) 310(H)  BUN 6 - 20 mg/dL _0 Creatinine 0.76 - 1.27 mg/dL 1.09 1.28(H) 1.14  Sodium 134 - 144 mmol/L 135 130(L) 133(L)  Potassium 3.5 - 5.2 mmol/L 4.1 4.4 3.9  Chloride 96 - 106 mmol/L 99 96(L) 98(L)  CO2 20 - 29 mmol/L _1 Calcium 8.7 - 10.2 mg/dL 9.7 9.3 9.2  Total Protein 6.0 - 8.5 g/dL 7.1 - -  Total Bilirubin 0.0 - 1.2 mg/dL 0.3 - -  Alkaline Phos 39 - 117 IU/L 98 - -  AST 0 - 40 IU/L 18 - -  ALT 0 - 44 IU/L 21 - -    Lab Results  Component Value Date   HGBA1C 12.9 (A) 06/17/2019     Assessment and Plan: 1. Type 1 diabetes mellitus with hyperglycemia (HCC) Uncontrolled with A1c of 12.9; goal is less than 7 Advised to increase Lantus to 35 units twice daily and use NovoLog sliding scale rather than fixed regimen of NovoLog to prevent hypoglycemia I have sent instructions of sliding scale via my chart to him He is due for an A1c-given regimen changes today I will hold off till his next in person visit in 2 months Continue diabetic diet, lifestyle modifications - Lancets (ONETOUCH ULTRASOFT) lancets; Use as instructed 3 times daily before meals  Dispense: 100 each; Refill: 12 - glucose blood (ONE TOUCH ULTRA TEST) test strip; Use as instructed three times daily before meals  Dispense: 100 each; Refill: 12 - atorvastatin (LIPITOR) 20 MG tablet; Take 1 tablet (20 mg total) by mouth daily.  Dispense: 30 tablet; Refill: 3 - Insulin Glargine (LANTUS SOLOSTAR) 100 UNIT/ML Solostar Pen; Inject 35 Units into the skin 2 (two) times daily.  Dispense: 30 mL; Refill: 3 - Insulin Lispro (HUMALOG KWIKPEN) 200 UNIT/ML SOPN; Inject 0-12 Units into the skin 3 (three) times daily. based on sliding scale  Dispense: 3 mL; Refill: 4  2. 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  Dispense: 60 capsule;  Refill: 3  3. Essential hypertension Controlled Counseled on blood pressure goal of less than 130/80, low-sodium, DASH diet, medication compliance, 150 minutes of moderate intensity exercise per week. Discussed medication compliance, adverse effects. - lisinopril (ZESTRIL) 10 MG tablet; Take 1 tablet (10 mg total) by mouth daily.  Dispense: 30 tablet; Refill: 3  4. Gastroesophageal reflux disease without esophagitis Controlled - omeprazole (PRILOSEC) 20 MG capsule; Take 1 capsule (20 mg total) by mouth daily.  Dispense: 30 capsule; Refill: 3   Follow Up Instructions: 2 months   I discussed the assessment and treatment plan with the patient. The patient was provided an opportunity to ask questions and all were answered. The patient agreed  with the plan and demonstrated an understanding of the instructions.   The patient was advised to call back or seek an in-person evaluation if the symptoms worsen or if the condition fails to improve as anticipated.     I provided 15 minutes total of non-face-to-face time during this encounter including median intraservice time, reviewing previous notes, labs, imaging, medications, management and patient verbalized understanding.     Charlott Rakes, MD, FAAFP. [facility] and [place], [place]   11/11/2019, 9:55 AM

## 2019-11-11 NOTE — Progress Notes (Signed)
Patient has been called and DOB has been verified. Patient has been screened and transferred to PCP to start phone visit.     

## 2019-11-25 ENCOUNTER — Other Ambulatory Visit: Payer: Self-pay | Admitting: Family Medicine

## 2020-01-13 ENCOUNTER — Other Ambulatory Visit: Payer: Self-pay

## 2020-01-13 ENCOUNTER — Ambulatory Visit: Payer: BC Managed Care – PPO | Attending: Family Medicine | Admitting: Family Medicine

## 2020-01-13 ENCOUNTER — Encounter: Payer: Self-pay | Admitting: Family Medicine

## 2020-01-13 VITALS — BP 138/80 | HR 83 | Ht 74.0 in | Wt 272.0 lb

## 2020-01-13 DIAGNOSIS — J4520 Mild intermittent asthma, uncomplicated: Secondary | ICD-10-CM | POA: Diagnosis not present

## 2020-01-13 DIAGNOSIS — F17200 Nicotine dependence, unspecified, uncomplicated: Secondary | ICD-10-CM | POA: Diagnosis not present

## 2020-01-13 DIAGNOSIS — E1049 Type 1 diabetes mellitus with other diabetic neurological complication: Secondary | ICD-10-CM

## 2020-01-13 DIAGNOSIS — E1065 Type 1 diabetes mellitus with hyperglycemia: Secondary | ICD-10-CM | POA: Diagnosis not present

## 2020-01-13 DIAGNOSIS — R0982 Postnasal drip: Secondary | ICD-10-CM

## 2020-01-13 LAB — POCT GLYCOSYLATED HEMOGLOBIN (HGB A1C): HbA1c, POC (controlled diabetic range): 9.1 % — AB (ref 0.0–7.0)

## 2020-01-13 LAB — GLUCOSE, POCT (MANUAL RESULT ENTRY): POC Glucose: 199 mg/dl — AB (ref 70–99)

## 2020-01-13 MED ORDER — ATORVASTATIN CALCIUM 20 MG PO TABS: 20.0000 mg | ORAL_TABLET | Freq: Every day | ORAL | 3 refills | Status: DC

## 2020-01-13 MED ORDER — GABAPENTIN 300 MG PO CAPS: 300.0000 mg | ORAL_CAPSULE | Freq: Two times a day (BID) | ORAL | 3 refills | Status: DC

## 2020-01-13 MED ORDER — LANTUS SOLOSTAR 100 UNIT/ML ~~LOC~~ SOPN: 35.0000 [IU] | PEN_INJECTOR | Freq: Two times a day (BID) | SUBCUTANEOUS | 3 refills | Status: DC

## 2020-01-13 MED ORDER — BUPROPION HCL ER (SR) 150 MG PO TB12: 150.0000 mg | ORAL_TABLET | Freq: Two times a day (BID) | ORAL | 3 refills | Status: DC

## 2020-01-13 MED ORDER — FLUTICASONE PROPIONATE 50 MCG/ACT NA SUSP: 2.0000 | Freq: Every day | NASAL | 6 refills | Status: DC

## 2020-01-13 NOTE — Progress Notes (Signed)
Established Patient Office Visit  Subjective:  Patient ID: Christopher Douglas, male    DOB: 16-Aug-1991  Age: 29 y.o. MRN: [id number]  CC:  Chief Complaint  Patient presents with  . Diabetes    HPI [name] is a 29 year old male with a history of type diabetes mellitus (A1c 9.1), hypertension, hyperlipidemia, GERD, and asthma who presents today for a follow up visit.  His A1c is improved from 12.9 in 05/2019 to 9.1 today.  Endorses compliance with medications but is only taking 30 units BID of Lantus versus the 35 units BID ordered.  His fasting blood sugars are 170-190 and he denies hypoglycemia.  Non compliant with diabetic diet but attempting to reduce carbohydrates.  Blood pressure is elevated today but was controlled at last visit.  He has no way to check it at home.  Endorses compliance with antihypertensive and takes it at bedtime.  Endorses compliance with statin and low cholesterol diet.  Reflux is controlled on omeprazole.  The patient acknowledges having to use is rescue inhaler daily and sometimes twice daily for the past few months.  States that it always seems to be needed more this time of year due to intermittent shortness of breath when he is outside working.  Use of inhaler resolves symptoms.  Denies wheezing.  States that he does have some sinus pressure and post nasal drip that have been present all winter.  Denies itchy eyes, rhinorrhea.  Denies having taken any medication to treat these symptoms.   Past Medical History:  Diagnosis Date  . Asthma   . Diabetes mellitus without complication ([place])   . Hypertension     Past Surgical History:  Procedure Laterality Date  . FOOT SURGERY      Family History  Problem Relation Age of Onset  . Diabetes Mellitus II Father   . Hypertension Father   . Alcohol abuse Sister   . Alcohol abuse Brother   . Diabetes Maternal Grandmother   . Diabetes Maternal Grandfather   . Hypertension Maternal Grandfather   . Hypertension  Mother     Social History   Socioeconomic History  . Marital status: Single    Spouse name: Not on file  . Number of children: Not on file  . Years of education: Not on file  . Highest education level: Not on file  Occupational History  . Not on file  Tobacco Use  . Smoking status: Current Every Day Smoker    Packs/day: 0.25    Years: 5.00    Pack years: 1.25    Types: Cigarettes  . Smokeless tobacco: Never Used  Substance and Sexual Activity  . Alcohol use: No  . Drug use: No  . Sexual activity: Yes    Birth control/protection: Condom  Other Topics Concern  . Not on file  Social History Narrative  . Not on file   Social Determinants of Health   Financial Resource Strain:   . Difficulty of Paying Living Expenses: Not on file  Food Insecurity:   . Worried About Charity fundraiser in the Last Year: Not on file  . Ran Out of Food in the Last Year: Not on file  Transportation Needs:   . Lack of Transportation (Medical): Not on file  . Lack of Transportation (Non-Medical): Not on file  Physical Activity:   . Days of Exercise per Week: Not on file  . Minutes of Exercise per Session: Not on file  Stress:   . Feeling  of Stress : Not on file  Social Connections:   . Frequency of Communication with Friends and Family: Not on file  . Frequency of Social Gatherings with Friends and Family: Not on file  . Attends Religious Services: Not on file  . Active Member of Clubs or Organizations: Not on file  . Attends Archivist Meetings: Not on file  . Marital Status: Not on file  Intimate Partner Violence:   . Fear of Current or Ex-Partner: Not on file  . Emotionally Abused: Not on file  . Physically Abused: Not on file  . Sexually Abused: Not on file    Outpatient Medications Prior to Visit  Medication Sig Dispense Refill  . albuterol (VENTOLIN HFA) 108 (90 Base) MCG/ACT inhaler INHALE 2 PUFFS INTO THE LUNGS EVERY 4 HOURS AS NEEDED FOR WHEEZING OR SHORTNESS OF  BREATH 8.5 g 2  . Blood Glucose Monitoring Suppl (ONE TOUCH ULTRA MINI) w/Device KIT 1 each by Does not apply route 3 (three) times daily. 1 each 0  . glucose blood (ONE TOUCH ULTRA TEST) test strip Use as instructed three times daily before meals 100 each 12  . ibuprofen (ADVIL) 800 MG tablet Take 1 tablet (800 mg total) by mouth 3 (three) times daily. 21 tablet 0  . Insulin Lispro (HUMALOG KWIKPEN) 200 UNIT/ML SOPN Inject 0-12 Units into the skin 3 (three) times daily. based on sliding scale 3 mL 4  . Insulin Pen Needle 31G X 5 MM MISC 1 each by Does not apply route 4 (four) times daily -  with meals and at bedtime. 100 each 0  . Lancets (ONETOUCH ULTRASOFT) lancets Use as instructed 3 times daily before meals 100 each 12  . lisinopril (ZESTRIL) 10 MG tablet Take 1 tablet (10 mg total) by mouth daily. 30 tablet 3  . omeprazole (PRILOSEC) 20 MG capsule Take 1 capsule (20 mg total) by mouth daily. 30 capsule 3  . atorvastatin (LIPITOR) 20 MG tablet Take 1 tablet (20 mg total) by mouth daily. 30 tablet 3  . buPROPion (WELLBUTRIN SR) 150 MG 12 hr tablet Take 1 tablet (150 mg total) by mouth 2 (two) times daily. 60 tablet 3  . gabapentin (NEURONTIN) 300 MG capsule Take 1 capsule (300 mg total) by mouth 2 (two) times daily. 60 capsule 3  . Insulin Glargine (LANTUS SOLOSTAR) 100 UNIT/ML Solostar Pen Inject 35 Units into the skin 2 (two) times daily. 30 mL 3   No facility-administered medications prior to visit.    Allergies  Allergen Reactions  . Amoxicillin Hives    Did it involve swelling of the face/tongue/throat, SOB, or low BP? Unknown Did it involve sudden or severe rash/hives, skin peeling, or any reaction on the inside of your mouth or nose? Yes Did you need to seek medical attention at a hospital or doctor's office? Unknown When did it last happen? childhood If all above answers are "NO", may proceed with cephalosporin use.   . Shellfish Allergy Hives    Specifically shrimp     ROS Review of Systems  Constitutional: Negative for fatigue, fever and unexpected weight change.  HENT: Positive for congestion, postnasal drip and sinus pressure. Negative for rhinorrhea and sinus pain.   Eyes: Negative for visual disturbance.  Respiratory: Positive for shortness of breath. Negative for cough, chest tightness and wheezing.        Intermittent - relieved by SABA  Cardiovascular: Negative for chest pain, palpitations and leg swelling.  Gastrointestinal: Negative for abdominal  distention, abdominal pain, constipation, diarrhea, nausea and vomiting.  Endocrine: Negative for polydipsia and polyuria.  Genitourinary: Negative for decreased urine volume, difficulty urinating and dysuria.  Musculoskeletal: Negative for arthralgias and myalgias.  Skin: Negative for color change and rash.  Neurological: Negative for dizziness, tremors, weakness and numbness.  Hematological: Does not bruise/bleed easily.  Psychiatric/Behavioral: Negative for agitation and behavioral problems.      Objective:    Physical Exam  Constitutional: He is oriented to person, place, and time. He appears well-developed and well-nourished. No distress.  HENT:  Head: Normocephalic and atraumatic.  Nose: Nose normal. Right sinus exhibits no maxillary sinus tenderness and no frontal sinus tenderness. Left sinus exhibits no maxillary sinus tenderness and no frontal sinus tenderness.  Mouth/Throat: Oropharynx is clear and moist.  Eyes: Pupils are equal, round, and reactive to light. Conjunctivae and EOM are normal.  Cardiovascular: Normal rate, regular rhythm, normal heart sounds and intact distal pulses.  No murmur heard. Pulmonary/Chest: Effort normal and breath sounds normal. No respiratory distress. He has no wheezes.  Abdominal: Soft. Bowel sounds are normal. He exhibits no distension. There is no abdominal tenderness.  Musculoskeletal:        General: No edema. Normal range of motion.   Neurological: He is alert and oriented to person, place, and time.  Skin: Skin is warm and dry. No rash noted.  Psychiatric: He has a normal mood and affect. His behavior is normal.  Nursing note and vitals reviewed.   BP 138/80   Pulse 83   Ht '6\' 2"'$  (1.88 m)   Wt 272 lb (123.4 kg)   SpO2 99%   BMI 34.92 kg/m  Wt Readings from Last 3 Encounters:  01/13/20 272 lb (123.4 kg)  06/17/19 261 lb (118.4 kg)  05/08/19 275 lb (124.7 kg)     Health Maintenance Due  Topic Date Due  . OPHTHALMOLOGY EXAM  07/09/1999  . FOOT EXAM  08/07/2019    There are no preventive care reminders to display for this patient.  Lab Results  Component Value Date   TSH 1.298 01/04/2013   Lab Results  Component Value Date   WBC 6.3 07/03/2017   HGB 14.8 07/03/2017   HCT 42.8 07/03/2017   MCV 82.3 07/03/2017   PLT 182 07/03/2017   Lab Results  Component Value Date   NA 135 06/17/2019   K 4.1 06/17/2019   CO2 22 06/17/2019   GLUCOSE 278 (H) 06/17/2019   BUN 6 06/17/2019   CREATININE 1.09 06/17/2019   BILITOT 0.3 06/17/2019   ALKPHOS 98 06/17/2019   AST 18 06/17/2019   ALT 21 06/17/2019   PROT 7.1 06/17/2019   ALBUMIN 4.4 06/17/2019   CALCIUM 9.7 06/17/2019   ANIONGAP 12 07/03/2017   GFR 111.73 09/16/2014   Lab Results  Component Value Date   CHOL 218 (H) 06/17/2019   Lab Results  Component Value Date   HDL 65 06/17/2019   Lab Results  Component Value Date   LDLCALC 126 (H) 06/17/2019   Lab Results  Component Value Date   TRIG 136 06/17/2019   Lab Results  Component Value Date   CHOLHDL 3.4 06/17/2019   Lab Results  Component Value Date   HGBA1C 9.1 (A) 01/13/2020      Assessment & Plan:   1. Type 1 diabetes mellitus with hyperglycemia ([place]) Uncontrolled with A1c of 9.1; goal <7.0 Congratulated on improvement from A1c of 12.9 - 05/2019 Educated patient about increasing Lantus from 30 units BID  to previously ordered dose of 35 units BID Continue sliding  scale Counseled on Diabetic diet, my plate method, 616 minutes of moderate intensity exercise/week Keep blood sugar logs with fasting goals of 80-120 mg/dl, random of less than 180 and in the event of sugars less than 60 mg/dl or greater than 400 mg/dl please notify the clinic ASAP. Diabetic eye exam scheduled on 01/20/2020 Foot exam completed today. - POCT glucose (manual entry) - POCT glycosylated hemoglobin (Hb A1C) - atorvastatin (LIPITOR) 20 MG tablet; Take 1 tablet (20 mg total) by mouth daily.  Dispense: 30 tablet; Refill: 3 - Insulin Glargine (LANTUS SOLOSTAR) 100 UNIT/ML Solostar Pen; Inject 35 Units into the skin 2 (two) times daily.  Dispense: 30 mL; Refill: 3 - CMP14+EGFR - Lipid panel  2. 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  Dispense: 60 capsule; Refill: 3  3. Tobacco dependence Patient has reduced smoking from 1 ppd to 3-5 cigarettes per day   Encouraged cessation Spent 3 minutes counseling on dangers of tobacco use and benefits of quitting and patient is ready to quit. - buPROPion (WELLBUTRIN SR) 150 MG 12 hr tablet; Take 1 tablet (150 mg total) by mouth 2 (two) times daily.  Dispense: 60 tablet; Refill: 3  4. Mild intermittent asthma without complication Uncontrolled Begin Flonase daily for symptom relief Add OTC cetirizine if no improvement with Flonase May need to add a controller medication if asthma symptoms do not improve  5. Post-nasal drainage See #4 - fluticasone (FLONASE) 50 MCG/ACT nasal spray; Place 2 sprays into both nostrils daily.  Dispense: 16 g; Refill: 6   Meds ordered this encounter  Medications  . atorvastatin (LIPITOR) 20 MG tablet    Sig: Take 1 tablet (20 mg total) by mouth daily.    Dispense:  30 tablet    Refill:  3  . Insulin Glargine (LANTUS SOLOSTAR) 100  UNIT/ML Solostar Pen    Sig: Inject 35 Units into the skin 2 (two) times daily.    Dispense:  30 mL    Refill:  3  . gabapentin (NEURONTIN) 300 MG capsule    Sig: Take 1 capsule (300 mg total) by mouth 2 (two) times daily.    Dispense:  60 capsule    Refill:  3  . buPROPion (WELLBUTRIN SR) 150 MG 12 hr tablet    Sig: Take 1 tablet (150 mg total) by mouth 2 (two) times daily.    Dispense:  60 tablet    Refill:  3  . fluticasone (FLONASE) 50 MCG/ACT nasal spray    Sig: Place 2 sprays into both nostrils daily.    Dispense:  16 g    Refill:  6    Follow-up: Return in about 3 months (around 04/11/2020) for chronic medical conditions.    Tomasita Morrow, RN

## 2020-01-13 NOTE — Patient Instructions (Signed)
Diabetes Mellitus and Exercise Exercising regularly is important for your overall health, especially when you have diabetes (diabetes mellitus). Exercising is not only about losing weight. It has many other health benefits, such as increasing muscle strength and bone density and reducing body fat and stress. This leads to improved fitness, flexibility, and endurance, all of which result in better overall health. Exercise has additional benefits for people with diabetes, including:  Reducing appetite.  Helping to lower and control blood glucose.  Lowering blood pressure.  Helping to control amounts of fatty substances (lipids) in the blood, such as cholesterol and triglycerides.  Helping the body to respond better to insulin (improving insulin sensitivity).  Reducing how much insulin the body needs.  Decreasing the risk for heart disease by: ? Lowering cholesterol and triglyceride levels. ? Increasing the levels of good cholesterol. ? Lowering blood glucose levels. What is my activity plan? Your health care provider or certified diabetes educator can help you make a plan for the type and frequency of exercise (activity plan) that works for you. Make sure that you:  Do at least 150 minutes of moderate-intensity or vigorous-intensity exercise each week. This could be brisk walking, biking, or water aerobics. ? Do stretching and strength exercises, such as yoga or weightlifting, at least 2 times a week. ? Spread out your activity over at least 3 days of the week.  Get some form of physical activity every day. ? Do not go more than 2 days in a row without some kind of physical activity. ? Avoid being inactive for more than 30 minutes at a time. Take frequent breaks to walk or stretch.  Choose a type of exercise or activity that you enjoy, and set realistic goals.  Start slowly, and gradually increase the intensity of your exercise over time. What do I need to know about managing my  diabetes?   Check your blood glucose before and after exercising. ? If your blood glucose is 240 mg/dL (13.3 mmol/L) or higher before you exercise, check your urine for ketones. If you have ketones in your urine, do not exercise until your blood glucose returns to normal. ? If your blood glucose is 100 mg/dL (5.6 mmol/L) or lower, eat a snack containing 15-20 grams of carbohydrate. Check your blood glucose 15 minutes after the snack to make sure that your level is above 100 mg/dL (5.6 mmol/L) before you start your exercise.  Know the symptoms of low blood glucose (hypoglycemia) and how to treat it. Your risk for hypoglycemia increases during and after exercise. Common symptoms of hypoglycemia can include: ? Hunger. ? Anxiety. ? Sweating and feeling clammy. ? Confusion. ? Dizziness or feeling light-headed. ? Increased heart rate or palpitations. ? Blurry vision. ? Tingling or numbness around the mouth, lips, or tongue. ? Tremors or shakes. ? Irritability.  Keep a rapid-acting carbohydrate snack available before, during, and after exercise to help prevent or treat hypoglycemia.  Avoid injecting insulin into areas of the body that are going to be exercised. For example, avoid injecting insulin into: ? The arms, when playing tennis. ? The legs, when jogging.  Keep records of your exercise habits. Doing this can help you and your health care provider adjust your diabetes management plan as needed. Write down: ? Food that you eat before and after you exercise. ? Blood glucose levels before and after you exercise. ? The type and amount of exercise you have done. ? When your insulin is expected to peak, if you use   insulin. Avoid exercising at times when your insulin is peaking.  When you start a new exercise or activity, work with your health care provider to make sure the activity is safe for you, and to adjust your insulin, medicines, or food intake as needed.  Drink plenty of water while  you exercise to prevent dehydration or heat stroke. Drink enough fluid to keep your urine clear or pale yellow. Summary  Exercising regularly is important for your overall health, especially when you have diabetes (diabetes mellitus).  Exercising has many health benefits, such as increasing muscle strength and bone density and reducing body fat and stress.  Your health care provider or certified diabetes educator can help you make a plan for the type and frequency of exercise (activity plan) that works for you.  When you start a new exercise or activity, work with your health care provider to make sure the activity is safe for you, and to adjust your insulin, medicines, or food intake as needed. This information is not intended to replace advice given to you by your health care provider. Make sure you discuss any questions you have with your health care provider. Document Revised: 06/01/2017 Document Reviewed: 04/18/2016 Elsevier Patient Education  2020 Elsevier Inc.  

## 2020-01-20 ENCOUNTER — Other Ambulatory Visit: Payer: BC Managed Care – PPO

## 2020-01-27 DIAGNOSIS — E119 Type 2 diabetes mellitus without complications: Secondary | ICD-10-CM | POA: Diagnosis not present

## 2020-01-27 LAB — HM DIABETES EYE EXAM

## 2020-02-03 ENCOUNTER — Ambulatory Visit: Payer: BC Managed Care – PPO | Attending: Family Medicine

## 2020-02-03 ENCOUNTER — Other Ambulatory Visit: Payer: Self-pay

## 2020-02-03 DIAGNOSIS — E1065 Type 1 diabetes mellitus with hyperglycemia: Secondary | ICD-10-CM | POA: Diagnosis not present

## 2020-02-04 LAB — CMP14+EGFR
ALT: 29 IU/L (ref 0–44)
AST: 33 IU/L (ref 0–40)
Albumin/Globulin Ratio: 1.5 (ref 1.2–2.2)
Albumin: 4.4 g/dL (ref 4.1–5.2)
Alkaline Phosphatase: 91 IU/L (ref 39–117)
BUN/Creatinine Ratio: 7 — ABNORMAL LOW (ref 9–20)
BUN: 8 mg/dL (ref 6–20)
Bilirubin Total: 0.3 mg/dL (ref 0.0–1.2)
CO2: 24 mmol/L (ref 20–29)
Calcium: 9.2 mg/dL (ref 8.7–10.2)
Chloride: 101 mmol/L (ref 96–106)
Creatinine, Ser: 1.2 mg/dL (ref 0.76–1.27)
GFR calc Af Amer: 93 mL/min/{1.73_m2} (ref >59)
GFR calc non Af Amer: 81 mL/min/{1.73_m2} (ref >59)
Globulin, Total: 3 g/dL (ref 1.5–4.5)
Glucose: 190 mg/dL — ABNORMAL HIGH (ref 65–99)
Potassium: 4.2 mmol/L (ref 3.5–5.2)
Sodium: 140 mmol/L (ref 134–144)
Total Protein: 7.4 g/dL (ref 6.0–8.5)

## 2020-02-04 LAB — LIPID PANEL
Chol/HDL Ratio: 2.9 ratio (ref 0.0–5.0)
Cholesterol, Total: 178 mg/dL (ref 100–199)
HDL: 61 mg/dL (ref >39)
LDL Chol Calc (NIH): 103 mg/dL — ABNORMAL HIGH (ref 0–99)
Triglycerides: 73 mg/dL (ref 0–149)
VLDL Cholesterol Cal: 14 mg/dL (ref 5–40)

## 2020-03-14 ENCOUNTER — Emergency Department (HOSPITAL_COMMUNITY)
Admission: EM | Admit: 2020-03-14 | Discharge: 2020-03-14 | Disposition: A | Discharge status: Home / Self Care | Payer: BC Managed Care – PPO | Attending: Emergency Medicine | Admitting: Emergency Medicine

## 2020-03-14 ENCOUNTER — Other Ambulatory Visit: Payer: Self-pay

## 2020-03-14 DIAGNOSIS — F17210 Nicotine dependence, cigarettes, uncomplicated: Secondary | ICD-10-CM | POA: Diagnosis not present

## 2020-03-14 DIAGNOSIS — A64 Unspecified sexually transmitted disease: Secondary | ICD-10-CM

## 2020-03-14 DIAGNOSIS — Z113 Encounter for screening for infections with a predominantly sexual mode of transmission: Secondary | ICD-10-CM | POA: Diagnosis not present

## 2020-03-14 DIAGNOSIS — N342 Other urethritis: Secondary | ICD-10-CM | POA: Diagnosis not present

## 2020-03-14 DIAGNOSIS — E119 Type 2 diabetes mellitus without complications: Secondary | ICD-10-CM | POA: Diagnosis not present

## 2020-03-14 DIAGNOSIS — Z794 Long term (current) use of insulin: Secondary | ICD-10-CM | POA: Diagnosis not present

## 2020-03-14 DIAGNOSIS — R360 Urethral discharge without blood: Secondary | ICD-10-CM | POA: Diagnosis not present

## 2020-03-14 LAB — URINALYSIS, ROUTINE W REFLEX MICROSCOPIC
Bacteria, UA: NONE SEEN
Bilirubin Urine: NEGATIVE
Glucose, UA: NEGATIVE mg/dL
Hgb urine dipstick: NEGATIVE
Ketones, ur: NEGATIVE mg/dL
Nitrite: NEGATIVE
Protein, ur: NEGATIVE mg/dL
Specific Gravity, Urine: 1.017 (ref 1.005–1.030)
WBC, UA: >50 WBC/hpf — ABNORMAL HIGH (ref 0–5)
pH: 6 (ref 5.0–8.0)

## 2020-03-14 LAB — HIV ANTIBODY (ROUTINE TESTING W REFLEX): HIV Screen 4th Generation wRfx: NONREACTIVE

## 2020-03-14 MED ORDER — CEFTRIAXONE SODIUM 1 G IJ SOLR
500.0000 mg | Freq: Once | INTRAMUSCULAR | Status: AC
  Administered 2020-03-14: 500 mg via INTRAMUSCULAR
  Filled 2020-03-14: qty 10

## 2020-03-14 MED ORDER — STERILE WATER FOR INJECTION IJ SOLN
INTRAMUSCULAR | Status: AC
  Administered 2020-03-14: 2.1 mL
  Filled 2020-03-14: qty 10

## 2020-03-14 MED ORDER — DOXYCYCLINE HYCLATE 100 MG PO CAPS
100.0000 mg | ORAL_CAPSULE | Freq: Two times a day (BID) | ORAL | 0 refills | Status: DC
Start: 2020-03-14 — End: 2020-06-03

## 2020-03-14 NOTE — Discharge Instructions (Addendum)
You likely have an STD.  The cultures have been sent.  Follow-up with the health department for further treatment.

## 2020-03-14 NOTE — ED Triage Notes (Signed)
Pt c/o discharge from penis with irritation

## 2020-03-14 NOTE — ED Provider Notes (Signed)
[facility] DEPT Provider Note   CSN: [id number] Arrival date & time: 03/14/20  0305     History Chief Complaint  Patient presents with  . SEXUALLY TRANSMITTED DISEASE    Christopher Douglas is a 29 y.o. male.  HPI Patient presents with penile discharge and irritation.  Has had for the last few days.  States he had unprotected sex with a partner around 4 days before the symptoms began.  Far as he knows she did not have any symptoms.  Some burning.  States there is some brownish to white discharge on his penis.  No testicular pain.  No fevers or chills.  States he did have an STD years ago.    Past Medical History:  Diagnosis Date  . Asthma   . Diabetes mellitus without complication ([place])   . Hypertension     Patient Active Problem List   Diagnosis Date Noted  . Breast abscess 05/13/2019  . Tobacco dependence 05/13/2019  . GERD (gastroesophageal reflux disease) 05/12/2017  . Hyperlipidemia 04/06/2016  . Hypertension 06/29/2015  . Diabetic neuropathy ([place]) 09/16/2014  . DM type 1 (diabetes mellitus, type 1) ([place]) 01/04/2013  . Unspecified constipation 01/04/2013  . Asthma 01/03/2013    Past Surgical History:  Procedure Laterality Date  . FOOT SURGERY         Family History  Problem Relation Age of Onset  . Diabetes Mellitus II Father   . Hypertension Father   . Alcohol abuse Sister   . Alcohol abuse Brother   . Diabetes Maternal Grandmother   . Diabetes Maternal Grandfather   . Hypertension Maternal Grandfather   . Hypertension Mother     Social History   Tobacco Use  . Smoking status: Current Every Day Smoker    Packs/day: 0.25    Years: 5.00    Pack years: 1.25    Types: Cigarettes  . Smokeless tobacco: Never Used  Substance Use Topics  . Alcohol use: No  . Drug use: No    Home Medications Prior to Admission medications   Medication Sig Start Date End Date Taking? Authorizing Provider  albuterol (VENTOLIN HFA) 108 (90  Base) MCG/ACT inhaler INHALE 2 PUFFS INTO THE LUNGS EVERY 4 HOURS AS NEEDED FOR WHEEZING OR SHORTNESS OF BREATH 11/25/19   Charlott Rakes, MD  atorvastatin (LIPITOR) 20 MG tablet Take 1 tablet (20 mg total) by mouth daily. 01/13/20   Charlott Rakes, MD  Blood Glucose Monitoring Suppl (ONE TOUCH ULTRA MINI) w/Device KIT 1 each by Does not apply route 3 (three) times daily. 11/24/17   Charlott Rakes, MD  buPROPion (WELLBUTRIN SR) 150 MG 12 hr tablet Take 1 tablet (150 mg total) by mouth 2 (two) times daily. 01/13/20   Charlott Rakes, MD  doxycycline (VIBRAMYCIN) 100 MG capsule Take 1 capsule (100 mg total) by mouth 2 (two) times daily. 03/14/20   Davonna Belling, MD  fluticasone (FLONASE) 50 MCG/ACT nasal spray Place 2 sprays into both nostrils daily. 01/13/20   Charlott Rakes, MD  gabapentin (NEURONTIN) 300 MG capsule Take 1 capsule (300 mg total) by mouth 2 (two) times daily. 01/13/20   Charlott Rakes, MD  glucose blood (ONE TOUCH ULTRA TEST) test strip Use as instructed three times daily before meals 11/11/19   Charlott Rakes, MD  ibuprofen (ADVIL) 800 MG tablet Take 1 tablet (800 mg total) by mouth 3 (three) times daily. 04/05/19   Raylene Everts, MD  Insulin Glargine (LANTUS SOLOSTAR) 100 UNIT/ML Solostar Pen Inject  35 Units into the skin 2 (two) times daily. 01/13/20   Charlott Rakes, MD  Insulin Lispro (HUMALOG KWIKPEN) 200 UNIT/ML SOPN Inject 0-12 Units into the skin 3 (three) times daily. based on sliding scale 11/11/19   Charlott Rakes, MD  Insulin Pen Needle 31G X 5 MM MISC 1 each by Does not apply route 4 (four) times daily -  with meals and at bedtime. 05/13/19   Elsie Stain, MD  Lancets [facility] ULTRASOFT) lancets Use as instructed 3 times daily before meals 11/11/19   Charlott Rakes, MD  lisinopril (ZESTRIL) 10 MG tablet Take 1 tablet (10 mg total) by mouth daily. 11/11/19   Charlott Rakes, MD  omeprazole (PRILOSEC) 20 MG capsule Take 1 capsule (20 mg total) by mouth daily.  11/11/19   Charlott Rakes, MD    Allergies    Amoxicillin and Shellfish allergy  Review of Systems   Review of Systems  Constitutional: Negative for appetite change, chills and fever.  Respiratory: Negative for shortness of breath.   Cardiovascular: Negative for chest pain.  Genitourinary: Positive for discharge and dysuria. Negative for testicular pain.  Neurological: Negative for weakness.    Physical Exam Updated Vital Signs BP (!) 154/88 (BP Location: Right Arm)   Pulse 72   Temp 98 F (36.7 C) (Oral)   Resp 18   Ht '6\' 3"'  (1.905 m)   Wt 117.9 kg   SpO2 100%   BMI 32.50 kg/m   Physical Exam Vitals and nursing note reviewed.  Abdominal:     Tenderness: There is no abdominal tenderness.  Genitourinary:    Comments: White purulent discharge to penis.  No skin lesions.  No groin adenopathy.  No testicular tenderness. Skin:    General: Skin is warm.     Capillary Refill: Capillary refill takes less than 2 seconds.  Neurological:     Mental Status: He is alert and oriented to person, place, and time.     ED Results / Procedures / Treatments   Labs (all labs ordered are listed, but only abnormal results are displayed) Labs Reviewed  URINALYSIS, ROUTINE W REFLEX MICROSCOPIC - Abnormal; Notable for the following components:      Result Value   APPearance CLOUDY (*)    Leukocytes,Ua LARGE (*)    WBC, UA >50 (*)    All other components within normal limits  RPR  HIV ANTIBODY (ROUTINE TESTING W REFLEX)  GC/CHLAMYDIA PROBE AMP ([place]) NOT AT [facility]    EKG None  Radiology No results found.  Procedures Procedures (including critical care time)  Medications Ordered in ED Medications  cefTRIAXone (ROCEPHIN) injection 500 mg (has no administration in time range)    ED Course  I have reviewed the triage vital signs and the nursing notes.  Pertinent labs & imaging results that were available during my care of the patient were reviewed by me and considered  in my medical decision making (see chart for details).    MDM Rules/Calculators/A&P                      Patient with penile discharge after unprotected sex.  Will treat with larger dose of Rocephin.  HIV RPR gonorrhea and Chlamydia testing done.  However will not return while he is here.  Will treat empirically also for chlamydia.  Health department follow-up. Final Clinical Impression(s) / ED Diagnoses Final diagnoses:  Urethritis  STD (male)    Rx / DC Orders ED Discharge Orders  Ordered    doxycycline (VIBRAMYCIN) 100 MG capsule  2 times daily     03/14/20 0805           Davonna Belling, MD 03/14/20 [id number]

## 2020-03-15 LAB — RPR: RPR Ser Ql: NONREACTIVE

## 2020-03-16 LAB — GC/CHLAMYDIA PROBE AMP (~~LOC~~) NOT AT ARMC
Chlamydia: NEGATIVE
Comment: NEGATIVE
Comment: NORMAL
Neisseria Gonorrhea: POSITIVE — AB

## 2020-03-25 ENCOUNTER — Other Ambulatory Visit: Payer: Self-pay

## 2020-03-25 ENCOUNTER — Telehealth: Payer: Self-pay | Admitting: Family Medicine

## 2020-03-25 MED ORDER — ALBUTEROL SULFATE HFA 108 (90 BASE) MCG/ACT IN AERS: INHALATION_SPRAY | RESPIRATORY_TRACT | 2 refills | Status: DC

## 2020-03-25 NOTE — Telephone Encounter (Signed)
albuterol (VENTOLIN HFA) 108 (90 Base) MCG/ACT inhaler [[id number]] [id number] - [place], [place] - [address] AT [facility] OF [address]  [address], [place] [id number]

## 2020-03-25 NOTE — Telephone Encounter (Signed)
Patient was called and a voicemail was left informing patient to return phone call. 

## 2020-04-13 ENCOUNTER — Ambulatory Visit: Payer: BC Managed Care – PPO | Admitting: Family Medicine

## 2020-04-19 ENCOUNTER — Encounter (HOSPITAL_COMMUNITY): Payer: Self-pay

## 2020-04-19 ENCOUNTER — Ambulatory Visit (HOSPITAL_COMMUNITY)
Admission: EM | Admit: 2020-04-19 | Discharge: 2020-04-19 | Disposition: A | Discharge status: Home / Self Care | Payer: BC Managed Care – PPO | Attending: Family Medicine | Admitting: Family Medicine

## 2020-04-19 ENCOUNTER — Other Ambulatory Visit: Payer: Self-pay

## 2020-04-19 DIAGNOSIS — J4541 Moderate persistent asthma with (acute) exacerbation: Secondary | ICD-10-CM

## 2020-04-19 DIAGNOSIS — R05 Cough: Secondary | ICD-10-CM

## 2020-04-19 DIAGNOSIS — R062 Wheezing: Secondary | ICD-10-CM

## 2020-04-19 DIAGNOSIS — R059 Cough, unspecified: Secondary | ICD-10-CM

## 2020-04-19 MED ORDER — MONTELUKAST SODIUM 10 MG PO TABS
10.0000 mg | ORAL_TABLET | Freq: Every day | ORAL | 1 refills | Status: DC
Start: 2020-04-19 — End: 2020-11-11

## 2020-04-19 MED ORDER — CETIRIZINE HCL 10 MG PO TABS: 10.0000 mg | ORAL_TABLET | Freq: Every day | ORAL | 1 refills | Status: DC

## 2020-04-19 MED ORDER — DEXAMETHASONE SODIUM PHOSPHATE 10 MG/ML IJ SOLN
INTRAMUSCULAR | Status: AC
  Filled 2020-04-19: qty 1

## 2020-04-19 MED ORDER — ALBUTEROL SULFATE HFA 108 (90 BASE) MCG/ACT IN AERS: 2.0000 | INHALATION_SPRAY | RESPIRATORY_TRACT | 0 refills | Status: DC | PRN

## 2020-04-19 MED ORDER — PREDNISONE 10 MG (21) PO TBPK: ORAL_TABLET | Freq: Every day | ORAL | 0 refills | Status: AC

## 2020-04-19 MED ORDER — DEXAMETHASONE SODIUM PHOSPHATE 10 MG/ML IJ SOLN
10.0000 mg | Freq: Once | INTRAMUSCULAR | Status: AC
  Administered 2020-04-19: 10 mg via INTRAMUSCULAR

## 2020-04-19 NOTE — ED Provider Notes (Signed)
[place]   [id number] 04/19/20 Arrival Time: 3614   CC: ASTHMA   SUBJECTIVE: History from: patient.  Christopher Douglas is a 29 y.o. male who presents with cough, wheezing and increased use of his albuterol inhaler. Denies sick exposure to COVID, flu or strep. Has hx of asthma. Denies recent travel. Has tried albuterol inhaler with temporary relief. There are no aggravating symptoms. Reports previous symptoms in the past.   Denies fever, chills, fatigue, sinus pain, rhinorrhea, sore throat, SOB, chest pain, nausea, changes in bowel or bladder habits.    ROS: As per HPI.  All other pertinent ROS negative.     Past Medical History:  Diagnosis Date  . Asthma   . Diabetes mellitus without complication ([place])   . Hypertension    Past Surgical History:  Procedure Laterality Date  . FOOT SURGERY     Allergies  Allergen Reactions  . Amoxicillin Hives    Did it involve swelling of the face/tongue/throat, SOB, or low BP? Unknown Did it involve sudden or severe rash/hives, skin peeling, or any reaction on the inside of your mouth or nose? Yes Did you need to seek medical attention at a hospital or doctor's office? Unknown When did it last happen? childhood If all above answers are "NO", may proceed with cephalosporin use.   . Shellfish Allergy Hives    Specifically shrimp   No current facility-administered medications on file prior to encounter.   Current Outpatient Medications on File Prior to Encounter  Medication Sig Dispense Refill  . atorvastatin (LIPITOR) 20 MG tablet Take 1 tablet (20 mg total) by mouth daily. 30 tablet 3  . Blood Glucose Monitoring Suppl (ONE TOUCH ULTRA MINI) w/Device KIT 1 each by Does not apply route 3 (three) times daily. 1 each 0  . buPROPion (WELLBUTRIN SR) 150 MG 12 hr tablet Take 1 tablet (150 mg total) by mouth 2 (two) times daily. 60 tablet 3  . doxycycline (VIBRAMYCIN) 100 MG capsule Take 1 capsule (100 mg total) by mouth 2 (two)  times daily. 14 capsule 0  . fluticasone (FLONASE) 50 MCG/ACT nasal spray Place 2 sprays into both nostrils daily. 16 g 6  . gabapentin (NEURONTIN) 300 MG capsule Take 1 capsule (300 mg total) by mouth 2 (two) times daily. 60 capsule 3  . glucose blood (ONE TOUCH ULTRA TEST) test strip Use as instructed three times daily before meals 100 each 12  . ibuprofen (ADVIL) 800 MG tablet Take 1 tablet (800 mg total) by mouth 3 (three) times daily. 21 tablet 0  . Insulin Glargine (LANTUS SOLOSTAR) 100 UNIT/ML Solostar Pen Inject 35 Units into the skin 2 (two) times daily. 30 mL 3  . Insulin Lispro (HUMALOG KWIKPEN) 200 UNIT/ML SOPN Inject 0-12 Units into the skin 3 (three) times daily. based on sliding scale 3 mL 4  . Insulin Pen Needle 31G X 5 MM MISC 1 each by Does not apply route 4 (four) times daily -  with meals and at bedtime. 100 each 0  . Lancets (ONETOUCH ULTRASOFT) lancets Use as instructed 3 times daily before meals 100 each 12  . lisinopril (ZESTRIL) 10 MG tablet Take 1 tablet (10 mg total) by mouth daily. 30 tablet 3  . omeprazole (PRILOSEC) 20 MG capsule Take 1 capsule (20 mg total) by mouth daily. 30 capsule 3   Social History   Socioeconomic History  . Marital status: Single    Spouse name: Not on file  . Number of children:  Not on file  . Years of education: Not on file  . Highest education level: Not on file  Occupational History  . Not on file  Tobacco Use  . Smoking status: Current Every Day Smoker    Packs/day: 0.25    Years: 5.00    Pack years: 1.25    Types: Cigarettes  . Smokeless tobacco: Never Used  Substance and Sexual Activity  . Alcohol use: No  . Drug use: No  . Sexual activity: Yes    Birth control/protection: Condom  Other Topics Concern  . Not on file  Social History Narrative  . Not on file   Social Determinants of Health   Financial Resource Strain:   . Difficulty of Paying Living Expenses:   Food Insecurity:   . Worried About Charity fundraiser  in the Last Year:   . Arboriculturist in the Last Year:   Transportation Needs:   . Film/video editor (Medical):   Marland Kitchen Lack of Transportation (Non-Medical):   Physical Activity:   . Days of Exercise per Week:   . Minutes of Exercise per Session:   Stress:   . Feeling of Stress :   Social Connections:   . Frequency of Communication with Friends and Family:   . Frequency of Social Gatherings with Friends and Family:   . Attends Religious Services:   . Active Member of Clubs or Organizations:   . Attends Archivist Meetings:   Marland Kitchen Marital Status:   Intimate Partner Violence:   . Fear of Current or Ex-Partner:   . Emotionally Abused:   Marland Kitchen Physically Abused:   . Sexually Abused:    Family History  Problem Relation Age of Onset  . Diabetes Mellitus II Father   . Hypertension Father   . Alcohol abuse Sister   . Alcohol abuse Brother   . Diabetes Maternal Grandmother   . Diabetes Maternal Grandfather   . Hypertension Maternal Grandfather   . Hypertension Mother     OBJECTIVE:  Vitals:   04/19/20 1355  BP: (!) 150/83  Pulse: 65  Resp: 20  Temp: 98.8 F (37.1 C)  SpO2: 99%     General appearance: alert; appears fatigued, but nontoxic; speaking in full sentences and tolerating own secretions HEENT: NCAT; Ears: EACs clear, TMs pearly gray; Eyes: PERRL.  EOM grossly intact. Sinuses: nontender; Nose: nares patent without rhinorrhea, Throat: oropharynx clear, tonsils non erythematous or enlarged, uvula midline  Neck: supple without LAD Lungs: unlabored respirations, symmetrical air entry; cough: mild; no respiratory distress; wheezing noted in bilateral lower lobes Heart: regular rate and rhythm.  Radial pulses 2+ symmetrical bilaterally Skin: warm and dry Psychological: alert and cooperative; normal mood and affect  LABS:  No results found for this or any previous visit (from the past 24 hour(s)).   ASSESSMENT & PLAN:  1. Moderate persistent asthma with  exacerbation   2. Wheezing   3. Cough     Meds ordered this encounter  Medications  . dexamethasone (DECADRON) injection 10 mg  . albuterol (VENTOLIN HFA) 108 (90 Base) MCG/ACT inhaler    Sig: Inhale 2 puffs into the lungs every 4 (four) hours as needed for wheezing or shortness of breath.    Dispense:  18 g    Refill:  0    Order Specific Question:   Supervising Provider    Answer:   Chase Picket [id number]  . cetirizine (ZYRTEC) 10 MG tablet    Sig: Take  1 tablet (10 mg total) by mouth daily.    Dispense:  30 tablet    Refill:  1    Order Specific Question:   Supervising Provider    Answer:   Chase Picket [id number]  . montelukast (SINGULAIR) 10 MG tablet    Sig: Take 1 tablet (10 mg total) by mouth at bedtime.    Dispense:  30 tablet    Refill:  1    Order Specific Question:   Supervising Provider    Answer:   Chase Picket [id number]  . predniSONE (STERAPRED UNI-PAK 21 TAB) 10 MG (21) TBPK tablet    Sig: Take by mouth daily for 6 days. Take 6 tablets on day 1, 5 tablets on day 2, 4 tablets on day 3, 3 tablets on day 4, 2 tablets on day 5, 1 tablet on day 6    Dispense:  21 tablet    Refill:  0    Order Specific Question:   Supervising Provider    Answer:   Chase Picket [[id number]]    Decadron in office today Prescribed Prednisone taper to start tomorrow Prescribed zyrtec Prescribed singulair Prescribed albuterol inhaler Get plenty of rest and push fluids Use OTC flonase for nasal congestion and runny nose Use medications daily for symptom relief Use OTC medications like ibuprofen or tylenol as needed fever or pain Call or go to the ED if you have any new or worsening symptoms such as fever, worsening cough, shortness of breath, chest tightness, chest pain, turning blue, changes in mental status.  Reviewed expectations re: course of current medical issues. Questions answered. Outlined signs and symptoms indicating need for more acute  intervention. Patient verbalized understanding. After Visit Summary given.         Faustino Congress, NP 04/19/20 1435

## 2020-04-19 NOTE — Discharge Instructions (Addendum)
You are having an asthma exacerbation  You have received a steroid injection in the office  I have sent over prescriptions for zyrtec, singulair, and albuterol inhaler  If you need the inhaler more than every 4 hours, you need to be evaluated in person by a health care provider  Start the prednisone tomorrow  I have included the name of an asthma specialist along with your paperwork today

## 2020-04-19 NOTE — ED Triage Notes (Signed)
Pt presents with asthma flare for past 2 weeks, has been using inhaler more frequently

## 2020-05-28 ENCOUNTER — Other Ambulatory Visit: Payer: Self-pay

## 2020-05-28 ENCOUNTER — Ambulatory Visit: Payer: Self-pay | Attending: Family | Admitting: Family

## 2020-05-28 ENCOUNTER — Encounter: Payer: Self-pay | Admitting: Family

## 2020-05-28 VITALS — BP 132/81 | HR 89 | Temp 97.7°F | Resp 16 | Wt 284.2 lb

## 2020-05-28 DIAGNOSIS — E1049 Type 1 diabetes mellitus with other diabetic neurological complication: Secondary | ICD-10-CM

## 2020-05-28 DIAGNOSIS — F17200 Nicotine dependence, unspecified, uncomplicated: Secondary | ICD-10-CM

## 2020-05-28 DIAGNOSIS — J4520 Mild intermittent asthma, uncomplicated: Secondary | ICD-10-CM

## 2020-05-28 DIAGNOSIS — E1065 Type 1 diabetes mellitus with hyperglycemia: Secondary | ICD-10-CM

## 2020-05-28 LAB — POCT GLYCOSYLATED HEMOGLOBIN (HGB A1C): HbA1c, POC (controlled diabetic range): 10.5 % — AB (ref 0.0–7.0)

## 2020-05-28 LAB — GLUCOSE, POCT (MANUAL RESULT ENTRY): POC Glucose: 265 mg/dl — AB (ref 70–99)

## 2020-05-28 NOTE — Patient Instructions (Addendum)
Increase Lantus to 38 units twice daily.   Changing Humalog to Novolog. This medication will remain on the same sliding scale.    Continue Gabapentin as needed.  Continue Albuterol and Singulair as needed.  Lab today.  Return in 1 month for diabetes check up with clinical pharmacist.   Return in 3 months or sooner if needed for management of chronic conditions.  Diabetes Basics  Diabetes (diabetes mellitus) is a long-term (chronic) disease. It occurs when the body does not properly use sugar (glucose) that is released from food after you eat. Diabetes may be caused by one or both of these problems:  Your pancreas does not make enough of a hormone called insulin.  Your body does not react in a normal way to insulin that it makes. Insulin lets sugars (glucose) go into cells in your body. This gives you energy. If you have diabetes, sugars cannot get into cells. This causes high blood sugar (hyperglycemia). Follow these instructions at home: How is diabetes treated? You may need to take insulin or other diabetes medicines daily to keep your blood sugar in balance. Take your diabetes medicines every day as told by your doctor. List your diabetes medicines here: Diabetes medicines  Name of medicine: ______________________________ ? Amount (dose): _______________ Time (a.m./p.m.): _______________ Notes: ___________________________________  Name of medicine: ______________________________ ? Amount (dose): _______________ Time (a.m./p.m.): _______________ Notes: ___________________________________  Name of medicine: ______________________________ ? Amount (dose): _______________ Time (a.m./p.m.): _______________ Notes: ___________________________________ If you use insulin, you will learn how to give yourself insulin by injection. You may need to adjust the amount based on the food that you eat. List the types of insulin you use here: Insulin  Insulin type:  ______________________________ ? Amount (dose): _______________ Time (a.m./p.m.): _______________ Notes: ___________________________________  Insulin type: ______________________________ ? Amount (dose): _______________ Time (a.m./p.m.): _______________ Notes: ___________________________________  Insulin type: ______________________________ ? Amount (dose): _______________ Time (a.m./p.m.): _______________ Notes: ___________________________________  Insulin type: ______________________________ ? Amount (dose): _______________ Time (a.m./p.m.): _______________ Notes: ___________________________________  Insulin type: ______________________________ ? Amount (dose): _______________ Time (a.m./p.m.): _______________ Notes: ___________________________________ How do I manage my blood sugar?  Check your blood sugar levels using a blood glucose monitor as directed by your doctor. Your doctor will set treatment goals for you. Generally, you should have these blood sugar levels:  Before meals (preprandial): 80-130 mg/dL (5.1-8.8 mmol/L).  After meals (postprandial): below 180 mg/dL (10 mmol/L).  A1c level: less than 7%. Write down the times that you will check your blood sugar levels: Blood sugar checks  Time: _______________ Notes: ___________________________________  Time: _______________ Notes: ___________________________________  Time: _______________ Notes: ___________________________________  Time: _______________ Notes: ___________________________________  Time: _______________ Notes: ___________________________________  Time: _______________ Notes: ___________________________________  What do I need to know about low blood sugar? Low blood sugar is called hypoglycemia. This is when blood sugar is at or below 70 mg/dL (3.9 mmol/L). Symptoms may include:  Feeling: ? Hungry. ? Worried or nervous (anxious). ? Sweaty and clammy. ? Confused. ? Dizzy. ? Sleepy. ? Sick to your  stomach (nauseous).  Having: ? A fast heartbeat. ? A headache. ? A change in your vision. ? Tingling or no feeling (numbness) around the mouth, lips, or tongue. ? Jerky movements that you cannot control (seizure).  Having trouble with: ? Moving (coordination). ? Sleeping. ? Passing out (fainting). ? Getting upset easily (irritability). Treating low blood sugar To treat low blood sugar, eat or drink something sugary right away. If you can think clearly and swallow safely, follow the  15:15 rule:  Take 15 grams of a fast-acting carb (carbohydrate). Talk with your doctor about how much you should take.  Some fast-acting carbs are: ? Sugar tablets (glucose pills). Take 3-4 glucose pills. ? 6-8 pieces of hard candy. ? 4-6 oz (120-150 mL) of fruit juice. ? 4-6 oz (120-150 mL) of regular (not diet) soda. ? 1 Tbsp (15 mL) honey or sugar.  Check your blood sugar 15 minutes after you take the carb.  If your blood sugar is still at or below 70 mg/dL (3.9 mmol/L), take 15 grams of a carb again.  If your blood sugar does not go above 70 mg/dL (3.9 mmol/L) after 3 tries, get help right away.  After your blood sugar goes back to normal, eat a meal or a snack within 1 hour. Treating very low blood sugar If your blood sugar is at or below 54 mg/dL (3 mmol/L), you have very low blood sugar (severe hypoglycemia). This is an emergency. Do not wait to see if the symptoms will go away. Get medical help right away. Call your local emergency services (911 in the [place] Do not drive yourself to the hospital. Questions to ask your health care provider  Do I need to meet with a diabetes educator?  What equipment will I need to care for myself at home?  What diabetes medicines do I need? When should I take them?  How often do I need to check my blood sugar?  What number can I call if I have questions?  When is my next doctor's visit?  Where can I find a support group for people with  diabetes? Where to find more information  American Diabetes Association: www.diabetes.org  American Association of Diabetes Educators: www.diabeteseducator.org/patient-resources Contact a doctor if:  Your blood sugar is at or above 240 mg/dL (06.3 mmol/L) for 2 days in a row.  You have been sick or have had a fever for 2 days or more, and you are not getting better.  You have any of these problems for more than 6 hours: ? You cannot eat or drink. ? You feel sick to your stomach (nauseous). ? You throw up (vomit). ? You have watery poop (diarrhea). Get help right away if:  Your blood sugar is lower than 54 mg/dL (3 mmol/L).  You get confused.  You have trouble: ? Thinking clearly. ? Breathing. Summary  Diabetes (diabetes mellitus) is a long-term (chronic) disease. It occurs when the body does not properly use sugar (glucose) that is released from food after digestion.  Take insulin and diabetes medicines as told.  Check your blood sugar every day, as often as told.  Keep all follow-up visits as told by your doctor. This is important. This information is not intended to replace advice given to you by your health care provider. Make sure you discuss any questions you have with your health care provider. Document Revised: 07/31/2019 Document Reviewed: 02/09/2018 Elsevier Patient Education  2020 [employer].

## 2020-05-28 NOTE — Progress Notes (Signed)
Patient ID: CLAUDIUS MICH, male    DOB: [birth year]  MRN: [id number]  CC: Diabetes Follow-Up  Subjective: Christopher Douglas is a 29 y.o. male with history of hypertension, asthma, GERD, diabetes type 1, diabetic neuropathy, hyperlipidemia, and tobacco dependence who presents for diabetes follow-up.  1. DIABETES TYPE 1 FOLLOW-UP: Last A1C: 10.5% on 05/28/2020   Are you fasting today: _0  Yes _1  No, bacon and cereal   Have you taken your anti-diabetic medications today: _2  Yes _3  No  Med Adherence:  _4  Yes    _5  No Medication side effects:  _6  Yes    _7  No Home Monitoring?  _8  Yes    _9  No Home glucose results range: 250s in the morning and 120s in the evening Diet Adherence: _10  Yes    _11  No, reports he has been eating more of the things which he knows he probably shouldn't and drinking more caffeine lately  Exercise: _12  Yes    _13  No  Hypoglycemic episodes?: _14  Yes    _15  No Numbness of the feet? _16  Yes    _17  No Retinopathy hx? _18  Yes    _19  No Last eye exam: March 2021 Comments: Last visit 01/13/2020 with Dr. Margarita Rana. During that encounter diabetes uncontrolled with A1C of 9.1%. Educated patient about increasing Lantus from 30 units daily to 35 units twice daily. Continued on sliding scale. Diabetic eye exam scheduled on 01/20/2020. Diabetic foot exam completed at that time. Atorvastatin continued.   Today reports taking medications as prescribed. Reports taking Humalog 5-10 units twice daily only with meals. Reports insurance no longer covers Humalog and will need a replacement for this.  2. DIABETIC NEUROLOGICAL COMPLICATION FOLLOW-UP: Last visit 01/13/2020 with Dr. Margarita Rana. During that encounter patient stable on Gabapentin.   Today reports taking Gabapentin only as needed.    3. ASTHMA FOLLOW-UP: Currently taking: see med list Taking albuterol MDI? yes Taking nebulized albuterol? no Taking controller medication? no Frequency of rescue medication use: about 1 to 2 times weekly  Med  Adherence: yes Recent wheezing?  no Shortness of breath? no Cough? no Recent exacerbations/ER visits/hospitalizations: June 2021  Asthma Triggers: seasons changing, allergies  Activity limitations?  no Last visit 01/13/2020 with Dr. Margarita Rana. During that encounter asthma uncontrolled. Began on Flonase daily for symptom relief. Consider adding OTC Cetirizine if no improvement with Flonase. May need to add a controller medication if asthma symptoms do not improve.  Last visit 04/19/2020 with nurse practitioner Zigmund Daniel at the [facility]. Patient treated for moderate persistent asthma with exacerbation, wheezing, and coughing. During that encounter patient administered Decadron in office. Prednisone taper to begin at discharged. Also prescribed Zyrtec, Singulair, and Albuterol inhaler.  Today patient reports he is feeling better and feels like the allergy pills are helping. Reports he does not use Albuterol inhaler as much as he once did.   4. TOBACCO DEPENDENCE FOLLOW-UP: Last visit 01/13/2020 with Dr. Margarita Rana. During that encounter patient reduced smoking from 1 pack daily to 3 to 5 cigarettes daily. Bupropion 150 mg 12 hr tablet by mouth twice daily prescribed.   Today reports he is not taking medication everyday as prescribed. Reports he is still smoking while taking Bupropion on occasion.  Patient Active Problem List   Diagnosis Date Noted  . Breast abscess 05/13/2019  . Tobacco dependence 05/13/2019  . GERD (gastroesophageal reflux disease) 05/12/2017  . Hyperlipidemia 04/06/2016  . Hypertension 06/29/2015  . Diabetic neuropathy ([place]) 09/16/2014  . DM  type 1 (diabetes mellitus, type 1) ([place]) 01/04/2013  . Unspecified constipation 01/04/2013  . Asthma 01/03/2013     Current Outpatient Medications on File Prior to Visit  Medication Sig Dispense Refill  . albuterol (VENTOLIN HFA) 108 (90 Base) MCG/ACT inhaler Inhale 2 puffs into the lungs every 4 (four) hours  as needed for wheezing or shortness of breath. 18 g 0  . atorvastatin (LIPITOR) 20 MG tablet Take 1 tablet (20 mg total) by mouth daily. 30 tablet 3  . Blood Glucose Monitoring Suppl (ONE TOUCH ULTRA MINI) w/Device KIT 1 each by Does not apply route 3 (three) times daily. 1 each 0  . buPROPion (WELLBUTRIN SR) 150 MG 12 hr tablet Take 1 tablet (150 mg total) by mouth 2 (two) times daily. 60 tablet 3  . cetirizine (ZYRTEC) 10 MG tablet Take 1 tablet (10 mg total) by mouth daily. 30 tablet 1  . doxycycline (VIBRAMYCIN) 100 MG capsule Take 1 capsule (100 mg total) by mouth 2 (two) times daily. 14 capsule 0  . fluticasone (FLONASE) 50 MCG/ACT nasal spray Place 2 sprays into both nostrils daily. 16 g 6  . gabapentin (NEURONTIN) 300 MG capsule Take 1 capsule (300 mg total) by mouth 2 (two) times daily. 60 capsule 3  . glucose blood (ONE TOUCH ULTRA TEST) test strip Use as instructed three times daily before meals 100 each 12  . ibuprofen (ADVIL) 800 MG tablet Take 1 tablet (800 mg total) by mouth 3 (three) times daily. 21 tablet 0  . Insulin Glargine (LANTUS SOLOSTAR) 100 UNIT/ML Solostar Pen Inject 35 Units into the skin 2 (two) times daily. 30 mL 3  . Insulin Lispro (HUMALOG KWIKPEN) 200 UNIT/ML SOPN Inject 0-12 Units into the skin 3 (three) times daily. based on sliding scale 3 mL 4  . Insulin Pen Needle 31G X 5 MM MISC 1 each by Does not apply route 4 (four) times daily -  with meals and at bedtime. 100 each 0  . Lancets (ONETOUCH ULTRASOFT) lancets Use as instructed 3 times daily before meals 100 each 12  . lisinopril (ZESTRIL) 10 MG tablet Take 1 tablet (10 mg total) by mouth daily. 30 tablet 3  . montelukast (SINGULAIR) 10 MG tablet Take 1 tablet (10 mg total) by mouth at bedtime. 30 tablet 1  . omeprazole (PRILOSEC) 20 MG capsule Take 1 capsule (20 mg total) by mouth daily. 30 capsule 3   No current facility-administered medications on file prior to visit.    Allergies  Allergen Reactions  .  Amoxicillin Hives    Did it involve swelling of the face/tongue/throat, SOB, or low BP? Unknown Did it involve sudden or severe rash/hives, skin peeling, or any reaction on the inside of your mouth or nose? Yes Did you need to seek medical attention at a hospital or doctor's office? Unknown When did it last happen? childhood If all above answers are "NO", may proceed with cephalosporin use.   . Shellfish Allergy Hives    Specifically shrimp    Social History   Socioeconomic History  . Marital status: Single    Spouse name: Not on file  . Number of children: Not on file  . Years of education: Not on file  . Highest education level: Not on file  Occupational History  . Not on file  Tobacco Use  . Smoking status: Current Every Day Smoker    Packs/day: 0.25    Years: 5.00    Pack years: 1.25  Types: Cigarettes  . Smokeless tobacco: Never Used  Vaping Use  . Vaping Use: Never used  Substance and Sexual Activity  . Alcohol use: No  . Drug use: No  . Sexual activity: Yes    Birth control/protection: Condom  Other Topics Concern  . Not on file  Social History Narrative  . Not on file   Social Determinants of Health   Financial Resource Strain:   . Difficulty of Paying Living Expenses:   Food Insecurity:   . Worried About Charity fundraiser in the Last Year:   . Arboriculturist in the Last Year:   Transportation Needs:   . Film/video editor (Medical):   Marland Kitchen Lack of Transportation (Non-Medical):   Physical Activity:   . Days of Exercise per Week:   . Minutes of Exercise per Session:   Stress:   . Feeling of Stress :   Social Connections:   . Frequency of Communication with Friends and Family:   . Frequency of Social Gatherings with Friends and Family:   . Attends Religious Services:   . Active Member of Clubs or Organizations:   . Attends Archivist Meetings:   Marland Kitchen Marital Status:   Intimate Partner Violence:   . Fear of Current or Ex-Partner:    . Emotionally Abused:   Marland Kitchen Physically Abused:   . Sexually Abused:     Family History  Problem Relation Age of Onset  . Diabetes Mellitus II Father   . Hypertension Father   . Alcohol abuse Sister   . Alcohol abuse Brother   . Diabetes Maternal Grandmother   . Diabetes Maternal Grandfather   . Hypertension Maternal Grandfather   . Hypertension Mother     Past Surgical History:  Procedure Laterality Date  . FOOT SURGERY      ROS: Review of Systems Negative except as stated above  PHYSICAL EXAM: Vitals with BMI 05/28/2020 04/19/2020 03/14/2020  Height - - -  Weight 284 lbs 3 oz - -  BMI - - -  Systolic 884 166 063  Diastolic 81 83 88  Pulse 89 65 80  SpO2- 98%, room air Temperature- 97.49F, oral  Physical Exam General appearance - alert, well appearing, and in no distress and oriented to person, place, and time Mental status - alert, oriented to person, place, and time, normal mood, behavior, speech, dress, motor activity, and thought processes Neck - supple, no significant adenopathy Lymphatics - no palpable lymphadenopathy, no hepatosplenomegaly Chest - clear to auscultation, no wheezes, rales or rhonchi, symmetric air entry, no tachypnea, retractions or cyanosis Heart - normal rate, regular rhythm, normal S1, S2, no murmurs, rubs, clicks or gallops Neurological - alert, oriented, normal speech, no focal findings or movement disorder noted, neck supple without rigidity, cranial nerves II through XII intact, DTR's normal and symmetric, motor and sensory grossly normal bilaterally, normal muscle tone, no tremors, strength 5/5, Romberg sign negative, normal gait and station Musculoskeletal - no joint tenderness, deformity or swelling, no muscular tenderness noted, full range of motion without pain Extremities - peripheral pulses normal, no pedal edema, no clubbing or cyanosis  Results for orders placed or performed in visit on 05/28/20  POCT glucose (manual entry)  Result  Value Ref Range   POC Glucose 265 (A) 70 - 99 mg/dl  POCT glycosylated hemoglobin (Hb A1C)  Result Value Ref Range   Hemoglobin A1C     HbA1c POC (<> result, manual entry)     HbA1c,  POC (prediabetic range)     HbA1c, POC (controlled diabetic range) 10.5 (A) 0.0 - 7.0 %    ASSESSMENT AND PLAN: 1. Type 1 diabetes mellitus with hyperglycemia ([place]): -Diabetes uncontrolled.  -Hemoglobin A1C 10.5% today. This is an increase from previous of 9.1% on 01/13/2020. Point-of-care blood glucose elevated at 265, patient asymptomatic, and non-fasting. -Patient admits he is not eating as he should and not exercising much. -To achieve an A1C goal of less than or equal to 7.0 percent, a fasting blood sugar of 80 to 130 mg/dL and a postprandial glucose (90 to 120 minutes after a meal) less than 180 mg/dL. In the event of sugars less than 60 mg/dl or greater than 400 mg/dl please notify the clinic ASAP. It is recommended that you undergo annual eye exams and annual foot exams. -Discussed the importance of healthy eating habits, low-carbohydrate diet, low-sugar diet, regular aerobic exercise (at least 150 minutes a week as tolerated) and medication compliance to achieve or maintain control of diabetes. -Increase Lantus from 35 units twice daily to 38 units twice daily. -Replacing Insulin Lispro (Humalog) with Insulin Aspart (Novolog) on the same sliding scale. Changing medication as patient reports his insurance is no longer providing coverage for Humalog. -BMP to check kidney function and electrolyte balance. -Microalbumin/creatinine urine ratio to check kidney function. -Follow-up with clinical pharmacist in 4 weeks for diabetes checkup. Write your home blood sugar results down each day and bring those results to your appointment along with your home glucose monitor. -Follow-up with primary physician in 3 months or sooner if needed. - POCT glucose (manual entry) - POCT glycosylated hemoglobin (Hb A1C) - Basic  Metabolic Panel - Microalbumin / creatinine urine ratio - atorvastatin (LIPITOR) 20 MG tablet; Take 1 tablet (20 mg total) by mouth daily.  Dispense: 30 tablet; Refill: 2 - insulin glargine (LANTUS SOLOSTAR) 100 UNIT/ML Solostar Pen; Inject 38 Units into the skin 2 (two) times daily.  Dispense: 30 mL; Refill: 3 - insulin aspart (NOVOLOG) 100 UNIT/ML injection; Inject 0-12 Units into the skin 3 (three) times daily before meals. Based on sliding scale.  Dispense: 10 mL; Refill: 4 - Amb Referral to Clinical Pharmacist  2. Other diabetic neurological complication associated with type 1 diabetes mellitus ([place]): -See #1  -Stable on Gabapentin. Continue as prescribed. - gabapentin (NEURONTIN) 300 MG capsule; Take 1 capsule (300 mg total) by mouth 2 (two) times daily.  Dispense: 60 capsule; Refill: 2  3. Mild intermittent asthma without complication:  -Asthma controlled. Patient reports his triggers tend to be allergy related.  -Last visit at the urgent care on 04/19/2020 for asthma exacerbation. Today patient reports feeling better since then and continues to take Singulair as needed. -Continue Albuterol inhaler as prescribed. -Follow-up with primary provider in 3 months or sooner if needed.  - albuterol (VENTOLIN HFA) 108 (90 Base) MCG/ACT inhaler; Inhale 2 puffs into the lungs every 4 (four) hours as needed for wheezing or shortness of breath.  Dispense: 18 g; Refill: 1  4. Tobacco dependence : -Patient reports he is still smoking cigarettes but trying to quit. Reports taking Bupropion sometimes. -Counseled to quit. Discussed health risk associated with smoking and cessation methods including NRT, Chantix, Buproprion.  -Continue Bupropion as prescribed. - buPROPion (WELLBUTRIN SR) 150 MG 12 hr tablet; Take 1 tablet (150 mg total) by mouth 2 (two) times daily.  Dispense: 60 tablet; Refill: 2  Patient was given the opportunity to ask questions.  Patient verbalized understanding of the plan and was  able to repeat key elements of the plan. Patient was given clear instructions to go to Emergency Department or return to medical center if symptoms don't improve, worsen, or new problems develop.The patient verbalized understanding.  Camillia Herter, NP

## 2020-05-29 MED ORDER — BUPROPION HCL ER (SR) 150 MG PO TB12: 150.0000 mg | ORAL_TABLET | Freq: Two times a day (BID) | ORAL | 2 refills | Status: DC

## 2020-05-29 MED ORDER — ATORVASTATIN CALCIUM 20 MG PO TABS: 20.0000 mg | ORAL_TABLET | Freq: Every day | ORAL | 2 refills | Status: DC

## 2020-05-29 MED ORDER — GABAPENTIN 300 MG PO CAPS: 300.0000 mg | ORAL_CAPSULE | Freq: Two times a day (BID) | ORAL | 2 refills | Status: DC

## 2020-05-29 MED ORDER — LANTUS SOLOSTAR 100 UNIT/ML ~~LOC~~ SOPN: 38.0000 [IU] | PEN_INJECTOR | Freq: Two times a day (BID) | SUBCUTANEOUS | 3 refills | Status: DC

## 2020-05-29 MED ORDER — INSULIN ASPART 100 UNIT/ML ~~LOC~~ SOLN: 0.0000 [IU] | Freq: Three times a day (TID) | SUBCUTANEOUS | 4 refills | Status: DC

## 2020-05-29 MED ORDER — ALBUTEROL SULFATE HFA 108 (90 BASE) MCG/ACT IN AERS: 2.0000 | INHALATION_SPRAY | RESPIRATORY_TRACT | 1 refills | Status: DC | PRN

## 2020-06-01 NOTE — Progress Notes (Signed)
Glucose and A1C discussed in clinic.  Microalbumin/creatinine urine ratio and CMP pending.

## 2020-06-02 ENCOUNTER — Other Ambulatory Visit: Payer: Self-pay

## 2020-06-02 ENCOUNTER — Ambulatory Visit: Payer: Self-pay | Attending: Family Medicine

## 2020-06-03 ENCOUNTER — Encounter (HOSPITAL_BASED_OUTPATIENT_CLINIC_OR_DEPARTMENT_OTHER): Payer: Self-pay | Admitting: Emergency Medicine

## 2020-06-03 ENCOUNTER — Emergency Department (HOSPITAL_BASED_OUTPATIENT_CLINIC_OR_DEPARTMENT_OTHER)
Admission: EM | Admit: 2020-06-03 | Discharge: 2020-06-03 | Disposition: A | Discharge status: Home / Self Care | Payer: Self-pay | Attending: Emergency Medicine | Admitting: Emergency Medicine

## 2020-06-03 ENCOUNTER — Other Ambulatory Visit: Payer: Self-pay

## 2020-06-03 DIAGNOSIS — Z113 Encounter for screening for infections with a predominantly sexual mode of transmission: Secondary | ICD-10-CM | POA: Insufficient documentation

## 2020-06-03 DIAGNOSIS — Z79899 Other long term (current) drug therapy: Secondary | ICD-10-CM | POA: Insufficient documentation

## 2020-06-03 DIAGNOSIS — R369 Urethral discharge, unspecified: Secondary | ICD-10-CM | POA: Diagnosis not present

## 2020-06-03 DIAGNOSIS — I10 Essential (primary) hypertension: Secondary | ICD-10-CM | POA: Insufficient documentation

## 2020-06-03 DIAGNOSIS — J45909 Unspecified asthma, uncomplicated: Secondary | ICD-10-CM | POA: Insufficient documentation

## 2020-06-03 DIAGNOSIS — F17210 Nicotine dependence, cigarettes, uncomplicated: Secondary | ICD-10-CM | POA: Insufficient documentation

## 2020-06-03 DIAGNOSIS — Z794 Long term (current) use of insulin: Secondary | ICD-10-CM | POA: Insufficient documentation

## 2020-06-03 DIAGNOSIS — E109 Type 1 diabetes mellitus without complications: Secondary | ICD-10-CM | POA: Insufficient documentation

## 2020-06-03 LAB — BASIC METABOLIC PANEL
BUN/Creatinine Ratio: 13 (ref 9–20)
BUN: 15 mg/dL (ref 6–20)
CO2: 25 mmol/L (ref 20–29)
Calcium: 9.8 mg/dL (ref 8.7–10.2)
Chloride: 96 mmol/L (ref 96–106)
Creatinine, Ser: 1.2 mg/dL (ref 0.76–1.27)
GFR calc Af Amer: 93 mL/min/{1.73_m2} (ref >59)
GFR calc non Af Amer: 81 mL/min/{1.73_m2} (ref >59)
Glucose: 287 mg/dL — ABNORMAL HIGH (ref 65–99)
Potassium: 4.7 mmol/L (ref 3.5–5.2)
Sodium: 135 mmol/L (ref 134–144)

## 2020-06-03 LAB — URINALYSIS, ROUTINE W REFLEX MICROSCOPIC
Bilirubin Urine: NEGATIVE
Glucose, UA: NEGATIVE mg/dL
Hgb urine dipstick: NEGATIVE
Ketones, ur: NEGATIVE mg/dL
Nitrite: NEGATIVE
Protein, ur: NEGATIVE mg/dL
Specific Gravity, Urine: >1.03 — ABNORMAL HIGH (ref 1.005–1.030)
pH: 5.5 (ref 5.0–8.0)

## 2020-06-03 LAB — MICROALBUMIN / CREATININE URINE RATIO
Creatinine, Urine: 205.6 mg/dL
Microalb/Creat Ratio: 10 mg/g creat (ref 0–29)
Microalbumin, Urine: 19.8 ug/mL

## 2020-06-03 LAB — URINALYSIS, MICROSCOPIC (REFLEX): WBC, UA: >50 WBC/hpf (ref 0–5)

## 2020-06-03 MED ORDER — DOXYCYCLINE HYCLATE 100 MG PO CAPS
100.0000 mg | ORAL_CAPSULE | Freq: Two times a day (BID) | ORAL | 0 refills | Status: AC
Start: 2020-06-03 — End: 2020-06-10

## 2020-06-03 MED ORDER — DOXYCYCLINE HYCLATE 100 MG PO TABS
100.0000 mg | ORAL_TABLET | Freq: Once | ORAL | Status: AC
  Administered 2020-06-03: 100 mg via ORAL
  Filled 2020-06-03: qty 1

## 2020-06-03 MED ORDER — METRONIDAZOLE 500 MG PO TABS
2000.0000 mg | ORAL_TABLET | Freq: Once | ORAL | Status: AC
  Administered 2020-06-03: 2000 mg via ORAL
  Filled 2020-06-03: qty 4

## 2020-06-03 MED ORDER — CEFTRIAXONE SODIUM 500 MG IJ SOLR
500.0000 mg | Freq: Once | INTRAMUSCULAR | Status: AC
  Administered 2020-06-03: 500 mg via INTRAMUSCULAR
  Filled 2020-06-03: qty 500

## 2020-06-03 NOTE — ED Triage Notes (Signed)
Pt c/o penile discharge and testicular pain. Pt wants to be checked for STD.

## 2020-06-03 NOTE — ED Provider Notes (Signed)
[facility] EMERGENCY DEPARTMENT Provider Note  CSN: [id number] Arrival date & time: 06/03/20 0316  Chief Complaint(s) SEXUALLY TRANSMITTED DISEASE  HPI Christopher Douglas is a 29 y.o. male   CC: penile discharge  Onset/Duration: yesterday Timing: Constant Severity: Moderate Modifying Factors:  Improved by: Nothing  Worsened by: Nothing Associated Signs/Symptoms:  Pertinent (+): Dysuria  Pertinent (-): Fevers, chills, swelling, scrotal pain, abdominal pain. Context: Patient reports being sexually active with 1 partner.  Last sexual intercourse was with this partner the day prior.  Unprotected.   HPI  Past Medical History Past Medical History:  Diagnosis Date  . Asthma   . Diabetes mellitus without complication ([place])   . Hypertension    Patient Active Problem List   Diagnosis Date Noted  . Breast abscess 05/13/2019  . Tobacco dependence 05/13/2019  . GERD (gastroesophageal reflux disease) 05/12/2017  . Hyperlipidemia 04/06/2016  . Hypertension 06/29/2015  . Diabetic neuropathy ([place]) 09/16/2014  . DM type 1 (diabetes mellitus, type 1) ([place]) 01/04/2013  . Unspecified constipation 01/04/2013  . Asthma 01/03/2013   Home Medication(s) Prior to Admission medications   Medication Sig Start Date End Date Taking? Authorizing Provider  albuterol (VENTOLIN HFA) 108 (90 Base) MCG/ACT inhaler Inhale 2 puffs into the lungs every 4 (four) hours as needed for wheezing or shortness of breath. 05/29/20   Camillia Herter, NP  atorvastatin (LIPITOR) 20 MG tablet Take 1 tablet (20 mg total) by mouth daily. 05/29/20   Camillia Herter, NP  Blood Glucose Monitoring Suppl (ONE TOUCH ULTRA MINI) w/Device KIT 1 each by Does not apply route 3 (three) times daily. 11/24/17   Charlott Rakes, MD  buPROPion (WELLBUTRIN SR) 150 MG 12 hr tablet Take 1 tablet (150 mg total) by mouth 2 (two) times daily. 05/29/20   Camillia Herter, NP  cetirizine (ZYRTEC) 10 MG tablet Take 1 tablet (10 mg total) by  mouth daily. 04/19/20   Faustino Congress, NP  doxycycline (VIBRAMYCIN) 100 MG capsule Take 1 capsule (100 mg total) by mouth 2 (two) times daily for 7 days. 06/03/20 06/10/20  Fatima Blank, MD  fluticasone (FLONASE) 50 MCG/ACT nasal spray Place 2 sprays into both nostrils daily. 01/13/20   Charlott Rakes, MD  gabapentin (NEURONTIN) 300 MG capsule Take 1 capsule (300 mg total) by mouth 2 (two) times daily. 05/29/20   Camillia Herter, NP  glucose blood (ONE TOUCH ULTRA TEST) test strip Use as instructed three times daily before meals 11/11/19   Charlott Rakes, MD  ibuprofen (ADVIL) 800 MG tablet Take 1 tablet (800 mg total) by mouth 3 (three) times daily. 04/05/19   Raylene Everts, MD  insulin aspart (NOVOLOG) 100 UNIT/ML injection Inject 0-12 Units into the skin 3 (three) times daily before meals. Based on sliding scale. 05/29/20   Camillia Herter, NP  insulin glargine (LANTUS SOLOSTAR) 100 UNIT/ML Solostar Pen Inject 38 Units into the skin 2 (two) times daily. 05/29/20   Camillia Herter, NP  Insulin Lispro (HUMALOG KWIKPEN) 200 UNIT/ML SOPN Inject 0-12 Units into the skin 3 (three) times daily. based on sliding scale 11/11/19   Charlott Rakes, MD  Insulin Pen Needle 31G X 5 MM MISC 1 each by Does not apply route 4 (four) times daily -  with meals and at bedtime. 05/13/19   Elsie Stain, MD  Lancets [facility] ULTRASOFT) lancets Use as instructed 3 times daily before meals 11/11/19   Charlott Rakes, MD  lisinopril (ZESTRIL) 10 MG tablet  Take 1 tablet (10 mg total) by mouth daily. 11/11/19   Charlott Rakes, MD  montelukast (SINGULAIR) 10 MG tablet Take 1 tablet (10 mg total) by mouth at bedtime. 04/19/20   Faustino Congress, NP  omeprazole (PRILOSEC) 20 MG capsule Take 1 capsule (20 mg total) by mouth daily. 11/11/19   Charlott Rakes, MD                                                                                                                                    Past Surgical  History Past Surgical History:  Procedure Laterality Date  . FOOT SURGERY     Family History Family History  Problem Relation Age of Onset  . Diabetes Mellitus II Father   . Hypertension Father   . Alcohol abuse Sister   . Alcohol abuse Brother   . Diabetes Maternal Grandmother   . Diabetes Maternal Grandfather   . Hypertension Maternal Grandfather   . Hypertension Mother     Social History Social History   Tobacco Use  . Smoking status: Current Every Day Smoker    Packs/day: 0.25    Years: 5.00    Pack years: 1.25    Types: Cigarettes  . Smokeless tobacco: Never Used  Vaping Use  . Vaping Use: Never used  Substance Use Topics  . Alcohol use: No  . Drug use: No   Allergies Amoxicillin and Shellfish allergy  Review of Systems Review of Systems All other systems are reviewed and are negative for acute change except as noted in the HPI  Physical Exam Vital Signs  I have reviewed the triage vital signs BP (!) 160/84   Pulse 87   Temp 97.6 F (36.4 C) (Oral)   Resp 16   Ht _0  (1.905 m)   Wt 129.3 kg   SpO2 100%   BMI 35.62 kg/m   Physical Exam Vitals reviewed.  Constitutional:      General: He is not in acute distress.    Appearance: He is well-developed. He is not diaphoretic.  HENT:     Head: Normocephalic and atraumatic.     Jaw: No trismus.     Right Ear: External ear normal.     Left Ear: External ear normal.     Nose: Nose normal.  Eyes:     General: No scleral icterus.    Conjunctiva/sclera: Conjunctivae normal.  Neck:     Trachea: Phonation normal.  Cardiovascular:     Rate and Rhythm: Normal rate and regular rhythm.  Pulmonary:     Effort: Pulmonary effort is normal. No respiratory distress.     Breath sounds: No stridor.  Abdominal:     General: There is no distension.  Genitourinary:    Penis: Discharge present.   Musculoskeletal:        General: Normal range of motion.     Cervical back: Normal range of motion.   Neurological:  Mental Status: He is alert and oriented to person, place, and time.  Psychiatric:        Behavior: Behavior normal.     ED Results and Treatments Labs (all labs ordered are listed, but only abnormal results are displayed) Labs Reviewed  URINALYSIS, ROUTINE W REFLEX MICROSCOPIC - Abnormal; Notable for the following components:      Result Value   APPearance HAZY (*)    Specific Gravity, Urine >1.030 (*)    Leukocytes,Ua SMALL (*)    All other components within normal limits  URINALYSIS, MICROSCOPIC (REFLEX) - Abnormal; Notable for the following components:   Bacteria, UA MANY (*)    All other components within normal limits  GC/CHLAMYDIA PROBE AMP ([place]) NOT AT [facility]                                                                                                                         EKG  EKG Interpretation  Date/Time:    Ventricular Rate:    PR Interval:    QRS Duration:   QT Interval:    QTC Calculation:   R Axis:     Text Interpretation:        Radiology No results found.  Pertinent labs & imaging results that were available during my care of the patient were reviewed by me and considered in my medical decision making (see chart for details).  Medications Ordered in ED Medications  cefTRIAXone (ROCEPHIN) injection 500 mg (has no administration in time range)  doxycycline (VIBRA-TABS) tablet 100 mg (has no administration in time range)  metroNIDAZOLE (FLAGYL) tablet 2,000 mg (has no administration in time range)                                                                                                                                    Procedures Procedures  (including critical care time)  Medical Decision Making / ED Course I have reviewed the nursing notes for this encounter and the patient's prior records (if available in EHR or on provided paperwork).   Christopher Douglas was evaluated in Emergency Department on 06/03/2020 for the  symptoms described in the history of present illness. He was evaluated in the context of the global COVID-19 pandemic, which necessitated consideration that the patient might be at risk for infection with the SARS-CoV-2 virus that causes COVID-19. Institutional protocols and algorithms that pertain to the  evaluation of patients at risk for COVID-19 are in a state of rapid change based on information released by regulatory bodies including the CDC and federal and state organizations. These policies and algorithms were followed during the patient's care in the ED.  Penile discharge. We will treat for GC/chlamydia and trichomonas.      Final Clinical Impression(s) / ED Diagnoses Final diagnoses:  Penile discharge    The patient appears reasonably screened and/or stabilized for discharge and I doubt any other medical condition or other [facility] requiring further screening, evaluation, or treatment in the ED at this time prior to discharge. Safe for discharge with strict return precautions.  Disposition: Discharge  Condition: Good  I have discussed the results, Dx and Tx plan with the patient/family who expressed understanding and agree(s) with the plan. Discharge instructions discussed at length. The patient/family was given strict return precautions who verbalized understanding of the instructions. No further questions at time of discharge.    ED Discharge Orders         Ordered    doxycycline (VIBRAMYCIN) 100 MG capsule  2 times daily     Discontinue  Reprint     06/03/20 0412            Follow Up: Charlott Rakes, MD [address] [place] [address] [phone]  Schedule an appointment as soon as possible for a visit  As needed     This chart was dictated using voice recognition software.  Despite best efforts to proofread,  errors can occur which can change the documentation meaning.   Fatima Blank, MD 06/03/20 [phone]

## 2020-06-04 LAB — GC/CHLAMYDIA PROBE AMP (~~LOC~~) NOT AT ARMC
Chlamydia: NEGATIVE
Comment: NEGATIVE
Comment: NORMAL
Neisseria Gonorrhea: POSITIVE — AB

## 2020-06-05 NOTE — Progress Notes (Signed)
Please call patient with update.   Kidney function normal.   CBG and hemoglobin A1C discussed in clinic.

## 2020-06-22 ENCOUNTER — Other Ambulatory Visit: Payer: Self-pay | Admitting: Family Medicine

## 2020-06-22 DIAGNOSIS — E1065 Type 1 diabetes mellitus with hyperglycemia: Secondary | ICD-10-CM

## 2020-06-22 MED ORDER — LANTUS SOLOSTAR 100 UNIT/ML ~~LOC~~ SOPN: 38.0000 [IU] | PEN_INJECTOR | Freq: Two times a day (BID) | SUBCUTANEOUS | 3 refills | Status: DC

## 2020-06-22 NOTE — Telephone Encounter (Signed)
insulin aspart (NOVOLOG) 100 UNIT/ML injection    Patient states this was supposed to be pen sent into pharmacy.

## 2020-06-29 ENCOUNTER — Ambulatory Visit: Payer: Self-pay | Admitting: Pharmacist

## 2020-06-29 NOTE — Progress Notes (Unsigned)
Seen endocrinologist? 350s at home, no lows [name] for assitance  S:  Patient arrives in no acute distress.  Presents for T1DM evaluation, education, and management. Patient was last seen by NP on behalf of PCP and referred on 05/28/20.    At that visit, BG and A1c elevated, lantus increased from 35 units BID to 38 units BID. Humalog changed to Novolog on same sliding scale due to insurance coverage.  Family/Social History:  - FHx:  - Tobacco:  - Alcohol:   Insurance coverage/medication affordability: ***   Current diabetes medications:  1. Insulin glargine (lantus) 38 units BID 2. Insulin aspart (novolog) 0-12 units TID with meals based on sliding scale  Adverse effects:  Medication adherence: ***   Hypoglycemia: - ***Denies or endorses - Experiences at BG < *** - Treats with: ***  Lifestyle: Diet: *** Eats *** meals/day - Breakfast:*** - Lunch:*** - Dinner:*** - Snacks:*** - Drinks:***  Exercise: ***  DM Comorbidities: - Nocturia: - Neuropathy:  - last DFE - Retinopathy:  - last eye exam - Gastroparesis:   Current hypertension medications include: *** Current hyperlipidemia medications include: ***  O:  A1c:10.5 05/28/20 POCT BG today:   Home BG Readings FBG: *** 2 hour post-meal/random: ***   Lipid Panel     Component Value Date/Time   CHOL 178 02/03/2020 0958   TRIG 73 02/03/2020 0958   HDL 61 02/03/2020 0958   CHOLHDL 2.9 02/03/2020 0958   CHOLHDL 4 09/16/2014 1546   VLDL 24.8 09/16/2014 1546   LDLCALC 103 (H) 02/03/2020 0958    Clinical Atherosclerotic Cardiovascular Disease (ASCVD):   A: 1. Diabetes - Most recent A1c not at goal < 7%, next due 08/2020 - Clinic BG today *** at goal of *** - Home BG readings *** at goal - Hypoglycemia: *** - Adherence: *** - ***Discussed mechanism of action, adverse effects, etc - ***Assessment  - ***Lifestyle - ***Education - Future considerations: ***  2. ASCVD: *** prevention - Last LDL  *** - On *** intensity statin - Aspirin is *** indicated  3. HTN - BP *** at goal of < 130/80 - Adherence:   P: -  - 2 week f/u    - F/U Pharmacist/PCP*** Clinic Visit in ***.   Patient seen with *** Written patient instructions provided.  Total time in face to face counseling *** minutes.

## 2020-09-01 ENCOUNTER — Ambulatory Visit: Payer: Self-pay | Admitting: Family Medicine

## 2020-11-03 ENCOUNTER — Telehealth: Payer: Self-pay | Admitting: Family Medicine

## 2020-11-03 ENCOUNTER — Other Ambulatory Visit: Payer: Self-pay

## 2020-11-03 DIAGNOSIS — E1065 Type 1 diabetes mellitus with hyperglycemia: Secondary | ICD-10-CM

## 2020-11-03 MED ORDER — INSULIN ASPART 100 UNIT/ML ~~LOC~~ SOLN
0.0000 [IU] | Freq: Three times a day (TID) | SUBCUTANEOUS | 0 refills | Status: DC
Start: 2020-11-03 — End: 2020-11-11

## 2020-11-03 MED ORDER — ONETOUCH ULTRASOFT LANCETS MISC: 12 refills | Status: DC

## 2020-11-03 NOTE — Telephone Encounter (Signed)
Refills has been sent to pharmacy. 

## 2020-11-03 NOTE — Telephone Encounter (Signed)
Lancets (ONETOUCH ULTRASOFT) lancets  insulin aspart (NOVOLOG) 100 UNIT/ML injection  [id number] - [place], [place] - [address] AT [facility] [address]  PT is requesting refills of the following medications. Please advise and  Thank you.

## 2020-11-04 NOTE — Telephone Encounter (Signed)
Patient called in to inquire of Dr Alvis Lemmings how they can help him pay for his medication Ph# [phone]

## 2020-11-05 ENCOUNTER — Ambulatory Visit: Payer: Self-pay | Admitting: Physician Assistant

## 2020-11-11 ENCOUNTER — Other Ambulatory Visit: Payer: Self-pay | Admitting: Pharmacist

## 2020-11-11 DIAGNOSIS — E1065 Type 1 diabetes mellitus with hyperglycemia: Secondary | ICD-10-CM

## 2020-11-11 DIAGNOSIS — I10 Essential (primary) hypertension: Secondary | ICD-10-CM

## 2020-11-11 DIAGNOSIS — E1049 Type 1 diabetes mellitus with other diabetic neurological complication: Secondary | ICD-10-CM

## 2020-11-11 DIAGNOSIS — R0982 Postnasal drip: Secondary | ICD-10-CM

## 2020-11-11 DIAGNOSIS — F17200 Nicotine dependence, unspecified, uncomplicated: Secondary | ICD-10-CM

## 2020-11-11 MED ORDER — CETIRIZINE HCL 10 MG PO TABS: 10.0000 mg | ORAL_TABLET | Freq: Every day | ORAL | 1 refills | Status: AC

## 2020-11-11 MED ORDER — BUPROPION HCL ER (SR) 150 MG PO TB12: 150.0000 mg | ORAL_TABLET | Freq: Two times a day (BID) | ORAL | 2 refills | Status: DC

## 2020-11-11 MED ORDER — LISINOPRIL 10 MG PO TABS: 10.0000 mg | ORAL_TABLET | Freq: Every day | ORAL | 3 refills | Status: DC

## 2020-11-11 MED ORDER — FLUTICASONE PROPIONATE 50 MCG/ACT NA SUSP: 2.0000 | Freq: Every day | NASAL | 6 refills | Status: DC

## 2020-11-11 MED ORDER — ATORVASTATIN CALCIUM 20 MG PO TABS: 20.0000 mg | ORAL_TABLET | Freq: Every day | ORAL | 2 refills | Status: DC

## 2020-11-11 MED ORDER — INSULIN ASPART 100 UNIT/ML ~~LOC~~ SOLN
0.0000 [IU] | Freq: Three times a day (TID) | SUBCUTANEOUS | 0 refills | Status: DC
Start: 2020-11-11 — End: 2020-12-07

## 2020-11-11 MED ORDER — GABAPENTIN 300 MG PO CAPS: 300.0000 mg | ORAL_CAPSULE | Freq: Two times a day (BID) | ORAL | 2 refills | Status: DC

## 2020-11-11 MED ORDER — MONTELUKAST SODIUM 10 MG PO TABS: 10.0000 mg | ORAL_TABLET | Freq: Every day | ORAL | 1 refills | Status: DC

## 2020-11-11 MED ORDER — LANTUS SOLOSTAR 100 UNIT/ML ~~LOC~~ SOPN: 38.0000 [IU] | PEN_INJECTOR | Freq: Two times a day (BID) | SUBCUTANEOUS | 3 refills | Status: DC

## 2020-11-11 MED FILL — LISINOPRIL 10 MG TABS: 10 | 30 days supply | Qty: 30 | Fill #0

## 2020-11-11 MED FILL — GABAPENTIN 300 MG CAPSULE: 300 | 30 days supply | Qty: 60 | Fill #0

## 2020-11-11 MED FILL — BUPROPION SR 150 MG TABLET: 150 | 30 days supply | Qty: 60 | Fill #0

## 2020-11-11 MED FILL — NovoLOG 100 UNIT/ML SOLN: 100 | 27 days supply | Qty: 10 | Fill #0

## 2020-11-11 MED FILL — LANTUS SOLOSTAR 100 UNITS/M: 100 | 30 days supply | Qty: 24 | Fill #0

## 2020-11-11 MED FILL — MONTELUKAST SOD 10 MG TAB: 10 | 30 days supply | Qty: 30 | Fill #0

## 2020-11-11 MED FILL — ATORVASTATIN CALCIUM 20 MG: 20 | 30 days supply | Qty: 30 | Fill #0

## 2020-11-11 MED FILL — FLUTICASONE PROP 50 MCG SPR: 50 | 30 days supply | Qty: 16 | Fill #0

## 2020-11-19 MED FILL — !LANTUS SOLOSTAR 100UNITS/M: 100 | 30 days supply | Qty: 24 | Fill #0

## 2020-11-19 MED FILL — !NOVOLOG 100UNITS/ML VIAL: 100/ML | 27 days supply | Qty: 10 | Fill #0

## 2020-12-02 ENCOUNTER — Ambulatory Visit: Payer: Self-pay | Admitting: Family Medicine

## 2020-12-07 ENCOUNTER — Other Ambulatory Visit: Payer: Self-pay | Admitting: Family Medicine

## 2020-12-07 ENCOUNTER — Other Ambulatory Visit: Payer: Self-pay

## 2020-12-07 ENCOUNTER — Ambulatory Visit: Payer: HRSA Program | Attending: Family Medicine | Admitting: Family Medicine

## 2020-12-07 ENCOUNTER — Encounter: Payer: Self-pay | Admitting: Family Medicine

## 2020-12-07 DIAGNOSIS — K219 Gastro-esophageal reflux disease without esophagitis: Secondary | ICD-10-CM | POA: Diagnosis not present

## 2020-12-07 DIAGNOSIS — E1065 Type 1 diabetes mellitus with hyperglycemia: Secondary | ICD-10-CM | POA: Diagnosis not present

## 2020-12-07 DIAGNOSIS — Z1159 Encounter for screening for other viral diseases: Secondary | ICD-10-CM

## 2020-12-07 MED ORDER — INSULIN ASPART 100 UNIT/ML IJ SOLN
0.0000 [IU] | Freq: Three times a day (TID) | INTRAMUSCULAR | 3 refills | Status: DC
  Filled 2021-05-06: qty 10, 28d supply, fill #0
  Filled 2021-07-19: qty 10, 28d supply, fill #1
  Filled 2021-09-23: qty 10, 28d supply, fill #2

## 2020-12-07 MED ORDER — OMEPRAZOLE 20 MG PO CPDR: 20.0000 mg | DELAYED_RELEASE_CAPSULE | Freq: Every day | ORAL | 3 refills | Status: DC

## 2020-12-07 MED FILL — NovoLOG 100 UNIT/ML SOLN: 100 | 27 days supply | Qty: 10 | Fill #0

## 2020-12-07 MED FILL — OMEPRAZOLE 20 MG CAP: 20 | 30 days supply | Qty: 30 | Fill #0

## 2020-12-07 NOTE — Progress Notes (Signed)
Virtual Visit via Telephone Note  I connected with Christopher Douglas, on 12/07/2020 at 2:00 PM by telephone due to the COVID-19 pandemic and verified that I am speaking with the correct person using two identifiers.   Consent: I discussed the limitations, risks, security and privacy concerns of performing an evaluation and management service by telephone and the availability of in person appointments. I also discussed with the patient that there may be a patient responsible charge related to this service. The patient expressed understanding and agreed to proceed.   Location of Patient: Home  Location of Provider: Home   Persons participating in Telemedicine visit: Chaitanya A Eula Flax Far[place]-CMA Dr. Margarita Rana     History of Present Illness: Christopher Douglas 30 year old male with history of type 1 diabetes mellitus (A1c 10.5), hypertension, hyperlipidemia who is seen for a follow-up visit  Random blood sugars have been around 225; he has been administering 30 units bid rather than 38 units bid which appears on his med list. Denies hypoglycemia, neuropathy is controlled on Gabapentin and he has no visual concerns. He has acid reflux; endorses eating as late as 8pm and going to bed 1.5 hours after; he uses TUMS which some relief Past Medical History:  Diagnosis Date  . Asthma   . Diabetes mellitus without complication ([place])   . Hypertension    Allergies  Allergen Reactions  . Amoxicillin Hives    Did it involve swelling of the face/tongue/throat, SOB, or low BP? Unknown Did it involve sudden or severe rash/hives, skin peeling, or any reaction on the inside of your mouth or nose? Yes Did you need to seek medical attention at a hospital or doctor's office? Unknown When did it last happen? childhood If all above answers are "NO", may proceed with cephalosporin use.   . Shellfish Allergy Hives    Specifically shrimp    Current Outpatient Medications on File Prior to Visit   Medication Sig Dispense Refill  . albuterol (VENTOLIN HFA) 108 (90 Base) MCG/ACT inhaler Inhale 2 puffs into the lungs every 4 (four) hours as needed for wheezing or shortness of breath. 18 g 1  . atorvastatin (LIPITOR) 20 MG tablet Take 1 tablet (20 mg total) by mouth daily. 30 tablet 2  . Blood Glucose Monitoring Suppl (ONE TOUCH ULTRA MINI) w/Device KIT 1 each by Does not apply route 3 (three) times daily. 1 each 0  . buPROPion (WELLBUTRIN SR) 150 MG 12 hr tablet Take 1 tablet (150 mg total) by mouth 2 (two) times daily. 60 tablet 2  . cetirizine (ZYRTEC) 10 MG tablet Take 1 tablet (10 mg total) by mouth daily. 30 tablet 1  . fluticasone (FLONASE) 50 MCG/ACT nasal spray Place 2 sprays into both nostrils daily. 16 g 6  . gabapentin (NEURONTIN) 300 MG capsule Take 1 capsule (300 mg total) by mouth 2 (two) times daily. 60 capsule 2  . glucose blood (ONE TOUCH ULTRA TEST) test strip Use as instructed three times daily before meals 100 each 12  . ibuprofen (ADVIL) 800 MG tablet Take 1 tablet (800 mg total) by mouth 3 (three) times daily. 21 tablet 0  . insulin aspart (NOVOLOG) 100 UNIT/ML injection Inject 0-12 Units into the skin 3 (three) times daily before meals. Based on sliding scale. 10 mL 0  . insulin glargine (LANTUS SOLOSTAR) 100 UNIT/ML Solostar Pen Inject 38 Units into the skin 2 (two) times daily. 30 mL 3  . Insulin Pen Needle 31G X 5 MM MISC 1  each by Does not apply route 4 (four) times daily -  with meals and at bedtime. 100 each 0  . Lancets (ONETOUCH ULTRASOFT) lancets Use as instructed 3 times daily before meals 100 each 12  . lisinopril (ZESTRIL) 10 MG tablet Take 1 tablet (10 mg total) by mouth daily. 30 tablet 3  . montelukast (SINGULAIR) 10 MG tablet Take 1 tablet (10 mg total) by mouth at bedtime. 30 tablet 1  . omeprazole (PRILOSEC) 20 MG capsule Take 1 capsule (20 mg total) by mouth daily. 30 capsule 3   No current facility-administered medications on file prior to visit.     Observations/Objective: Awake, alert, oriented x3 Not in acute distress  Lab Results  Component Value Date   HGBA1C 10.5 (A) 05/28/2020    Assessment and Plan: 1. Type 1 diabetes mellitus with hyperglycemia (HCC) Uncontrolled with A1c of 10.5; goal is <7.0 Administering 30 units bid of Lantus rather than 38 units prescribed If A1c remains above goal will advise to change to 38 units Counseled on Diabetic diet, my plate method, 029 minutes of moderate intensity exercise/week Blood sugar logs with fasting goals of 80-120 mg/dl, random of less than 180 and in the event of sugars less than 60 mg/dl or greater than 400 mg/dl encouraged to notify the clinic. Advised on the need for annual eye exams, annual foot exams, Pneumonia vaccine. - Hemoglobin A1c; Future - CMP14+EGFR; Future - Microalbumin / creatinine urine ratio; Future - insulin aspart (NOVOLOG) 100 UNIT/ML injection; Inject 0-12 Units into the skin 3 (three) times daily before meals. Based on sliding scale.  Dispense: 10 mL; Refill: 3  2. Gastroesophageal reflux disease without esophagitis Dietary modifications, avoid being recumbent up to 2 hours post meals Avoid late meals, avoid triggers - omeprazole (PRILOSEC) 20 MG capsule; Take 1 capsule (20 mg total) by mouth daily.  Dispense: 30 capsule; Refill: 3  3. Need for hepatitis C screening test - HCV RNA quant rflx ultra or genotyp([facility]); Future   Follow Up Instructions:  3 months   I discussed the assessment and treatment plan with the patient. The patient was provided an opportunity to ask questions and all were answered. The patient agreed with the plan and demonstrated an understanding of the instructions.   The patient was advised to call back or seek an in-person evaluation if the symptoms worsen or if the condition fails to improve as anticipated.     I provided 15 minutes total of non-face-to-face time during this encounter including median  intraservice time, reviewing previous notes, investigations, ordering medications, medical decision making, coordinating care and patient verbalized understanding at the end of the visit.     Charlott Rakes, MD, FAAFP. [facility] and [place], [place]   12/07/2020, 2:00 PM

## 2020-12-07 NOTE — Progress Notes (Signed)
Has been having real bad heartburn. Needs refills on medication and testing supplies.

## 2020-12-11 ENCOUNTER — Other Ambulatory Visit: Payer: Self-pay

## 2020-12-14 ENCOUNTER — Ambulatory Visit: Payer: Self-pay | Attending: Family Medicine

## 2020-12-14 ENCOUNTER — Other Ambulatory Visit: Payer: Self-pay

## 2020-12-14 DIAGNOSIS — Z1159 Encounter for screening for other viral diseases: Secondary | ICD-10-CM

## 2020-12-14 DIAGNOSIS — E1065 Type 1 diabetes mellitus with hyperglycemia: Secondary | ICD-10-CM

## 2020-12-14 MED FILL — OMEPRAZOLE 20 MG CAP: 20 | 30 days supply | Qty: 30 | Fill #0

## 2020-12-15 LAB — CMP14+EGFR
ALT: 29 IU/L (ref 0–44)
AST: 47 IU/L — ABNORMAL HIGH (ref 0–40)
Albumin/Globulin Ratio: 1.4 (ref 1.2–2.2)
Albumin: 4.4 g/dL (ref 4.0–5.0)
Alkaline Phosphatase: 90 IU/L (ref 44–121)
BUN/Creatinine Ratio: 6 — ABNORMAL LOW (ref 9–20)
BUN: 7 mg/dL (ref 6–20)
Bilirubin Total: 0.4 mg/dL (ref 0.0–1.2)
CO2: 25 mmol/L (ref 20–29)
Calcium: 9 mg/dL (ref 8.7–10.2)
Chloride: 103 mmol/L (ref 96–106)
Creatinine, Ser: 1.16 mg/dL (ref 0.76–1.27)
GFR calc Af Amer: 96 mL/min/{1.73_m2} (ref >59)
GFR calc non Af Amer: 83 mL/min/{1.73_m2} (ref >59)
Globulin, Total: 3.1 g/dL (ref 1.5–4.5)
Glucose: 155 mg/dL — ABNORMAL HIGH (ref 65–99)
Potassium: 4.5 mmol/L (ref 3.5–5.2)
Sodium: 141 mmol/L (ref 134–144)
Total Protein: 7.5 g/dL (ref 6.0–8.5)

## 2020-12-15 LAB — HCV RNA QUANT RFLX ULTRA OR GENOTYP: HCV Quant Baseline: <15 IU/mL

## 2020-12-15 LAB — HEMOGLOBIN A1C
Est. average glucose Bld gHb Est-mCnc: 306 mg/dL
Hgb A1c MFr Bld: 12.3 % — ABNORMAL HIGH (ref 4.8–5.6)

## 2020-12-15 LAB — MICROALBUMIN / CREATININE URINE RATIO
Creatinine, Urine: 456.6 mg/dL
Microalb/Creat Ratio: 2 mg/g creat (ref 0–29)
Microalbumin, Urine: 9.9 ug/mL

## 2020-12-18 ENCOUNTER — Telehealth: Payer: Self-pay

## 2020-12-18 NOTE — Telephone Encounter (Signed)
-----   Message from Hoy Register, MD sent at 12/16/2020  1:02 PM EST ----- A1c is uncontrolled at 12.3 please advise to increase Lantus to 38 units daily. Other labs are stable

## 2020-12-18 NOTE — Telephone Encounter (Signed)
Patient name and DOB has been verified Patient was informed of lab results. Patient had no questions.  

## 2020-12-25 IMAGING — DX DG HIP (WITH OR WITHOUT PELVIS) 2-3V LEFT
3 series · 3 of 3 positions shown · non-contrast
Comparison: None.

CLINICAL DATA: Left hip pain for 1 day. Possible injury exercising.
Initial encounter.

EXAM:
DG HIP (WITH OR WITHOUT PELVIS) 2-3V LEFT

[pelvis ap]
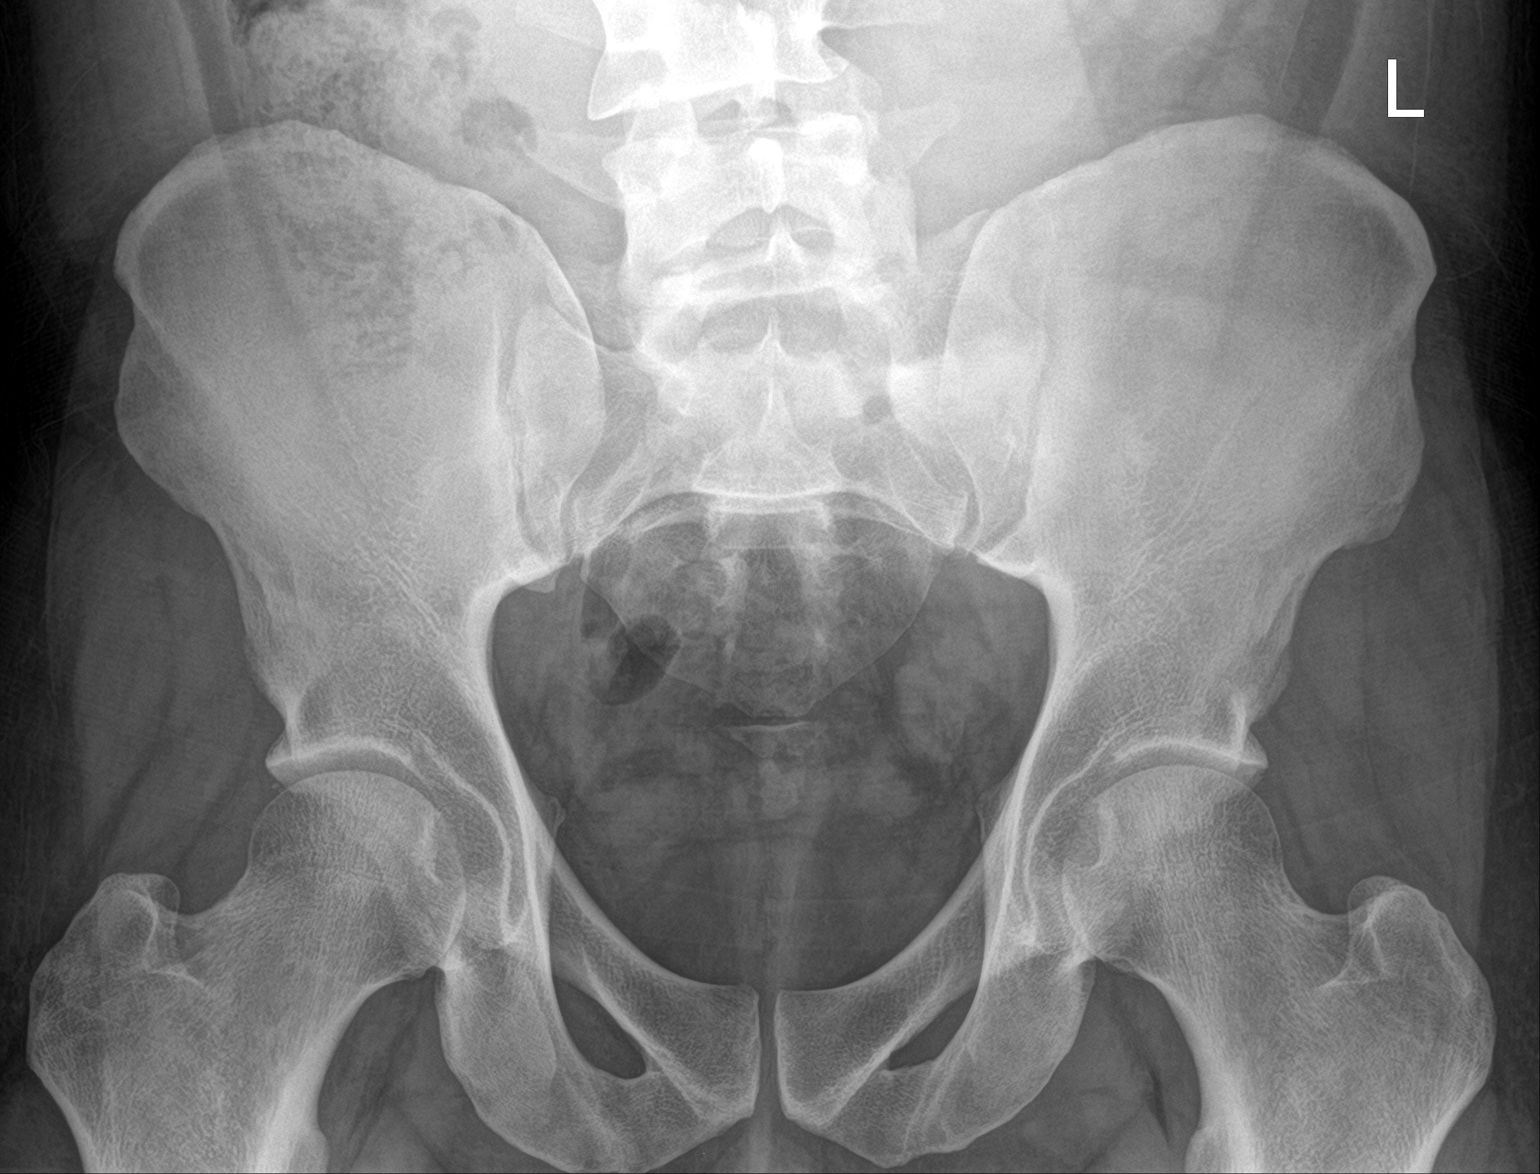

[hip ap]
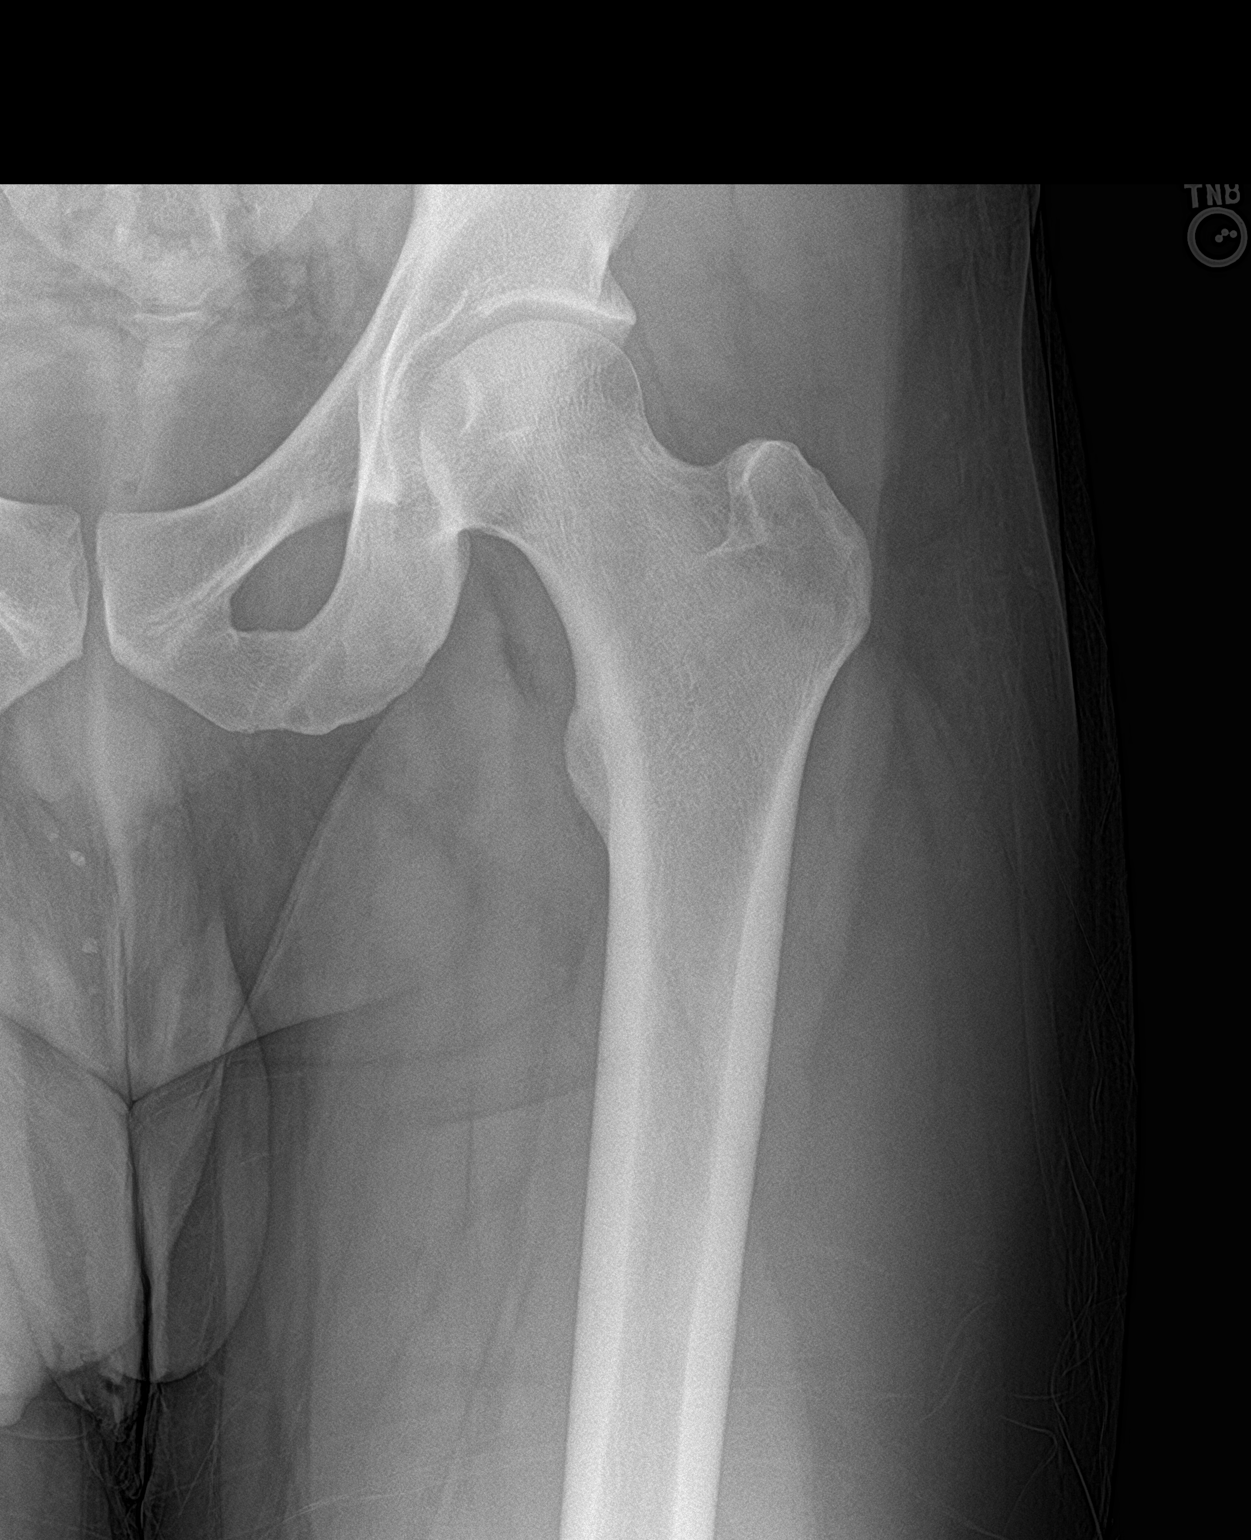

[hip lat]
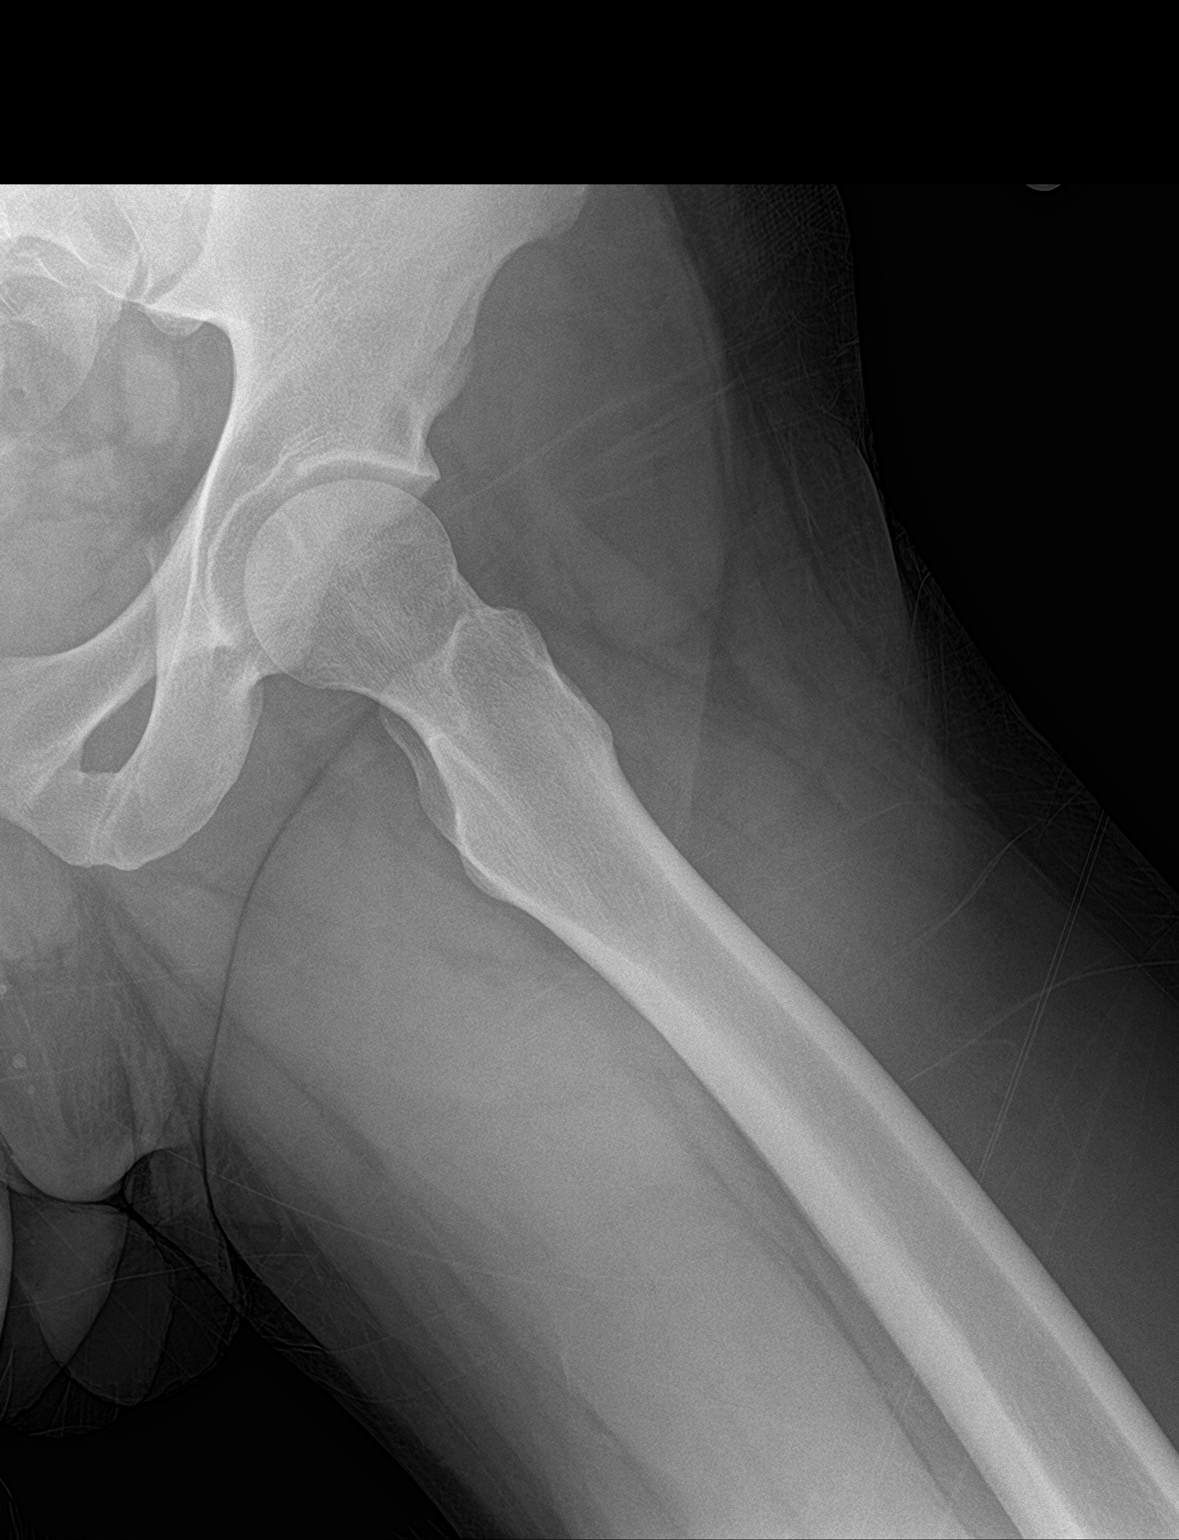

[3 of 3 positions shown; findings below may reference images not displayed]

FINDINGS: There is no evidence of hip fracture or dislocation. There is no
evidence of arthropathy or other focal bone abnormality.
IMPRESSION: Normal exam.

## 2021-02-01 ENCOUNTER — Other Ambulatory Visit: Payer: Self-pay | Admitting: Pharmacist

## 2021-02-01 DIAGNOSIS — E1065 Type 1 diabetes mellitus with hyperglycemia: Secondary | ICD-10-CM

## 2021-02-01 MED ORDER — "INSULIN SYRINGE-NEEDLE U-100 31G X 5/16"" 0.3 ML MISC": 2 refills | Status: DC

## 2021-02-01 MED FILL — !NOVOLOG 100UNITS/ML VIAL: 100/ML | 27 days supply | Qty: 10 | Fill #0

## 2021-03-04 ENCOUNTER — Other Ambulatory Visit: Payer: Self-pay

## 2021-03-26 ENCOUNTER — Other Ambulatory Visit: Payer: Self-pay

## 2021-05-06 ENCOUNTER — Other Ambulatory Visit: Payer: Self-pay

## 2021-05-06 MED FILL — Insulin Glargine Soln Pen-Injector 100 Unit/ML: SUBCUTANEOUS | 31 days supply | Qty: 24 | Fill #0 | Status: AC

## 2021-05-10 ENCOUNTER — Other Ambulatory Visit: Payer: Self-pay

## 2021-06-01 ENCOUNTER — Other Ambulatory Visit: Payer: Self-pay

## 2021-06-14 ENCOUNTER — Other Ambulatory Visit: Payer: Self-pay

## 2021-07-19 ENCOUNTER — Other Ambulatory Visit: Payer: Self-pay

## 2021-07-19 MED FILL — Insulin Glargine Soln Pen-Injector 100 Unit/ML: SUBCUTANEOUS | 31 days supply | Qty: 24 | Fill #1 | Status: AC

## 2021-07-21 ENCOUNTER — Other Ambulatory Visit: Payer: Self-pay

## 2021-09-16 ENCOUNTER — Other Ambulatory Visit: Payer: Self-pay

## 2021-09-23 ENCOUNTER — Other Ambulatory Visit: Payer: Self-pay

## 2021-09-23 MED FILL — Insulin Glargine Soln Pen-Injector 100 Unit/ML: SUBCUTANEOUS | 31 days supply | Qty: 24 | Fill #2 | Status: AC

## 2021-09-24 ENCOUNTER — Other Ambulatory Visit: Payer: Self-pay

## 2021-10-01 ENCOUNTER — Other Ambulatory Visit: Payer: Self-pay

## 2021-11-03 ENCOUNTER — Other Ambulatory Visit: Payer: Self-pay

## 2021-12-03 ENCOUNTER — Other Ambulatory Visit: Payer: Self-pay

## 2021-12-14 ENCOUNTER — Other Ambulatory Visit: Payer: Self-pay

## 2022-01-13 ENCOUNTER — Other Ambulatory Visit: Payer: Self-pay

## 2022-01-13 ENCOUNTER — Other Ambulatory Visit: Payer: Self-pay | Admitting: Family Medicine

## 2022-01-13 DIAGNOSIS — E1065 Type 1 diabetes mellitus with hyperglycemia: Secondary | ICD-10-CM

## 2022-01-14 ENCOUNTER — Other Ambulatory Visit: Payer: Self-pay | Admitting: Family Medicine

## 2022-01-14 ENCOUNTER — Other Ambulatory Visit: Payer: Self-pay

## 2022-01-14 DIAGNOSIS — E1065 Type 1 diabetes mellitus with hyperglycemia: Secondary | ICD-10-CM

## 2022-01-14 MED ORDER — LANTUS SOLOSTAR 100 UNIT/ML ~~LOC~~ SOPN
38.0000 [IU] | PEN_INJECTOR | Freq: Two times a day (BID) | SUBCUTANEOUS | 0 refills | Status: DC
  Filled 2022-01-14: qty 27, 36d supply, fill #0
  Filled 2022-05-13: qty 27, 36d supply, fill #1

## 2022-01-14 MED ORDER — INSULIN ASPART 100 UNIT/ML IJ SOLN
0.0000 [IU] | Freq: Three times a day (TID) | INTRAMUSCULAR | 0 refills | Status: DC
  Filled 2022-01-14: qty 10, 28d supply, fill #0

## 2022-01-17 ENCOUNTER — Other Ambulatory Visit: Payer: Self-pay

## 2022-05-13 ENCOUNTER — Other Ambulatory Visit: Payer: Self-pay | Admitting: Family Medicine

## 2022-05-13 ENCOUNTER — Other Ambulatory Visit (HOSPITAL_COMMUNITY): Payer: Self-pay

## 2022-05-13 ENCOUNTER — Telehealth: Payer: Self-pay | Admitting: Family Medicine

## 2022-05-13 DIAGNOSIS — E1065 Type 1 diabetes mellitus with hyperglycemia: Secondary | ICD-10-CM

## 2022-05-13 DIAGNOSIS — K219 Gastro-esophageal reflux disease without esophagitis: Secondary | ICD-10-CM

## 2022-05-17 ENCOUNTER — Other Ambulatory Visit (HOSPITAL_COMMUNITY): Payer: Self-pay

## 2022-05-17 NOTE — Telephone Encounter (Signed)
Pls fu with Christopher Douglas re his medication as he is out. He will answer [phone].

## 2022-05-20 ENCOUNTER — Other Ambulatory Visit: Payer: Self-pay

## 2022-05-20 ENCOUNTER — Other Ambulatory Visit: Payer: Self-pay | Admitting: Family Medicine

## 2022-05-20 ENCOUNTER — Other Ambulatory Visit: Payer: Self-pay | Admitting: Pharmacist

## 2022-05-20 DIAGNOSIS — E1049 Type 1 diabetes mellitus with other diabetic neurological complication: Secondary | ICD-10-CM

## 2022-05-20 DIAGNOSIS — E1065 Type 1 diabetes mellitus with hyperglycemia: Secondary | ICD-10-CM

## 2022-05-20 MED ORDER — INSULIN LISPRO (1 UNIT DIAL) 100 UNIT/ML (KWIKPEN)
PEN_INJECTOR | SUBCUTANEOUS | 0 refills | Status: DC
  Filled 2022-05-20: qty 12, 33d supply, fill #0

## 2022-05-20 MED ORDER — BASAGLAR KWIKPEN 100 UNIT/ML ~~LOC~~ SOPN
38.0000 [IU] | PEN_INJECTOR | Freq: Two times a day (BID) | SUBCUTANEOUS | 0 refills | Status: DC
  Filled 2022-05-20: qty 24, 32d supply, fill #0

## 2022-05-20 MED ORDER — INSULIN ASPART 100 UNIT/ML IJ SOLN
0.0000 [IU] | Freq: Three times a day (TID) | INTRAMUSCULAR | 0 refills | Status: DC
  Filled 2022-05-20: qty 10, 28d supply, fill #0

## 2022-05-20 MED ORDER — LANTUS SOLOSTAR 100 UNIT/ML ~~LOC~~ SOPN
38.0000 [IU] | PEN_INJECTOR | Freq: Two times a day (BID) | SUBCUTANEOUS | 0 refills | Status: DC
  Filled 2022-05-20: qty 30, 39d supply, fill #0

## 2022-05-20 NOTE — Telephone Encounter (Signed)
Apt 06/22/22

## 2022-05-20 NOTE — Telephone Encounter (Signed)
Called pt. Advised him that medications were sent to [facility] pharmacy and would be available for pu this evening. Pt said he would go and pu. No further assistance needed.

## 2022-05-20 NOTE — Telephone Encounter (Signed)
Medication Refill - Medication: HUMALOG KWIKPEN 100 UNIT/ML KwikPen [[id number]]  DISCONTINUED  Has the patient contacted their pharmacy? Yes.   (Agent: If no, request that the patient contact the pharmacy for the refill. If patient does not wish to contact the pharmacy document the reason why and proceed with request.) (Agent: If yes, when and what did the pharmacy advise?)  Preferred Pharmacy (with phone number or street name):  Has the patient been seen for an appointment in the last year OR does the patient have an upcoming appointment?  [facility] at [facility]  [address] [place] [address]  Phone: [phone] Fax: [phone]  Hours: M-F 7:30a-6:00p    Agent: Please be advised that RX refills may take up to 3 business days. We ask that you follow-up with your pharmacy.

## 2022-05-20 NOTE — Telephone Encounter (Signed)
Pt did not mean to ask for Humalog, he meant Novolog, He has appt now so will send back through for Novolog and Lantus.

## 2022-05-20 NOTE — Telephone Encounter (Signed)
Rx for Humalog and Basaglar have been sent to pharmacy today via [place], [facility]. Will go ahead and close encounter.

## 2022-05-20 NOTE — Telephone Encounter (Signed)
Pt has appt 06/22/22. Requested Prescriptions  Pending Prescriptions Disp Refills  . insulin glargine (LANTUS SOLOSTAR) 100 UNIT/ML Solostar Pen 30 mL 0    Sig: Inject 38 Units into the skin 2 (two) times daily.     Endocrinology:  Diabetes - Insulins Failed - 05/20/2022 10:55 AM      Failed - HBA1C is between 0 and 7.9 and within 180 days    HbA1c, POC (prediabetic range)  Date Value Ref Range Status  08/06/2018 10.6 (A) 5.7 - 6.4 % Final   HbA1c, POC (controlled diabetic range)  Date Value Ref Range Status  05/28/2020 10.5 (A) 0.0 - 7.0 % Final   Hgb A1c MFr Bld  Date Value Ref Range Status  12/14/2020 12.3 (H) 4.8 - 5.6 % Final    Comment:             Prediabetes: 5.7 - 6.4          Diabetes: >6.4          Glycemic control for adults with diabetes: <7.0          Failed - Valid encounter within last 6 months    Recent Outpatient Visits          1 year ago Need for hepatitis C screening test   [facility] [place], Odette Horns, MD   1 year ago Type 1 diabetes mellitus with hyperglycemia [facility])   [facility] [place], [place] J, NP   2 years ago Type 1 diabetes mellitus with hyperglycemia (HCC)   [place] [facility] [place], Odette Horns, MD   2 years ago Type 1 diabetes mellitus with hyperglycemia [facility])   [place] [facility] [place], Odette Horns, MD   2 years ago Type 1 diabetes mellitus with hyperglycemia [facility])   [place] [facility] Hoy Register, MD      Future Appointments            In 1 month [place], Marzella Schlein, PA-C [place] [facility]           . insulin aspart (NOVOLOG) 100 UNIT/ML injection 10 mL 0    Sig: Inject 0-12 Units into the skin 3 (three) times daily before meals.     Endocrinology:  Diabetes - Insulins Failed - 05/20/2022 10:55 AM      Failed - HBA1C is between 0 and 7.9 and within 180 days    HbA1c, POC (prediabetic  range)  Date Value Ref Range Status  08/06/2018 10.6 (A) 5.7 - 6.4 % Final   HbA1c, POC (controlled diabetic range)  Date Value Ref Range Status  05/28/2020 10.5 (A) 0.0 - 7.0 % Final   Hgb A1c MFr Bld  Date Value Ref Range Status  12/14/2020 12.3 (H) 4.8 - 5.6 % Final    Comment:             Prediabetes: 5.7 - 6.4          Diabetes: >6.4          Glycemic control for adults with diabetes: <7.0          Failed - Valid encounter within last 6 months    Recent Outpatient Visits          1 year ago Need for hepatitis C screening test   [facility] [place], Odette Horns, MD   1 year ago Type 1 diabetes mellitus with hyperglycemia (  [facility])   [facility] [place], [place] J, NP   2 years ago Type 1 diabetes mellitus with hyperglycemia [facility])   [facility] [place], Odette Horns, MD   2 years ago Type 1 diabetes mellitus with hyperglycemia [facility])   [facility] [place], Odette Horns, MD   2 years ago Type 1 diabetes mellitus with hyperglycemia [facility])   [place] [facility] Hoy Register, MD      Future Appointments            In 1 month [place], Marzella Schlein, PA-C [facility]

## 2022-06-22 ENCOUNTER — Encounter: Payer: Self-pay | Admitting: Physician Assistant

## 2022-06-22 ENCOUNTER — Other Ambulatory Visit: Payer: Self-pay

## 2022-06-22 ENCOUNTER — Ambulatory Visit: Payer: Self-pay | Attending: Physician Assistant | Admitting: Physician Assistant

## 2022-06-22 VITALS — BP 131/85 | HR 71 | Ht 75.0 in | Wt 248.0 lb

## 2022-06-22 DIAGNOSIS — I10 Essential (primary) hypertension: Secondary | ICD-10-CM

## 2022-06-22 DIAGNOSIS — J4520 Mild intermittent asthma, uncomplicated: Secondary | ICD-10-CM

## 2022-06-22 DIAGNOSIS — Z91199 Patient's noncompliance with other medical treatment and regimen due to unspecified reason: Secondary | ICD-10-CM

## 2022-06-22 DIAGNOSIS — F17200 Nicotine dependence, unspecified, uncomplicated: Secondary | ICD-10-CM

## 2022-06-22 DIAGNOSIS — E1065 Type 1 diabetes mellitus with hyperglycemia: Secondary | ICD-10-CM

## 2022-06-22 DIAGNOSIS — E785 Hyperlipidemia, unspecified: Secondary | ICD-10-CM

## 2022-06-22 DIAGNOSIS — E1049 Type 1 diabetes mellitus with other diabetic neurological complication: Secondary | ICD-10-CM

## 2022-06-22 DIAGNOSIS — K219 Gastro-esophageal reflux disease without esophagitis: Secondary | ICD-10-CM

## 2022-06-22 LAB — POCT GLYCOSYLATED HEMOGLOBIN (HGB A1C): HbA1c, POC (controlled diabetic range): 11.6 % — AB (ref 0.0–7.0)

## 2022-06-22 LAB — GLUCOSE, POCT (MANUAL RESULT ENTRY): POC Glucose: 247 mg/dl — AB (ref 70–99)

## 2022-06-22 MED ORDER — GABAPENTIN 300 MG PO CAPS
300.0000 mg | ORAL_CAPSULE | Freq: Two times a day (BID) | ORAL | 3 refills | Status: DC
  Filled 2022-06-22: qty 60, 30d supply, fill #0

## 2022-06-22 MED ORDER — INSULIN PEN NEEDLE 31G X 5 MM MISC
1.0000 | Freq: Three times a day (TID) | 0 refills | Status: AC
  Filled 2022-06-22: qty 100, 25d supply, fill #0

## 2022-06-22 MED ORDER — CHANTIX STARTING MONTH PAK 0.5 MG X 11 & 1 MG X 42 PO TBPK
ORAL_TABLET | ORAL | 0 refills | Status: AC
  Filled 2022-06-22: qty 98, 28d supply, fill #0

## 2022-06-22 MED ORDER — INSULIN LISPRO (1 UNIT DIAL) 100 UNIT/ML (KWIKPEN)
PEN_INJECTOR | SUBCUTANEOUS | 4 refills | Status: DC
  Filled 2022-06-22: qty 12, 34d supply, fill #0

## 2022-06-22 MED ORDER — BASAGLAR KWIKPEN 100 UNIT/ML ~~LOC~~ SOPN
35.0000 [IU] | PEN_INJECTOR | Freq: Two times a day (BID) | SUBCUTANEOUS | 5 refills | Status: DC
  Filled 2022-06-22: qty 24, 34d supply, fill #0

## 2022-06-22 MED ORDER — "INSULIN SYRINGE-NEEDLE U-100 31G X 5/16"" 0.3 ML MISC"
2 refills | Status: AC
  Filled 2022-06-22: qty 100, 30d supply, fill #0

## 2022-06-22 MED ORDER — LISINOPRIL 10 MG PO TABS
10.0000 mg | ORAL_TABLET | Freq: Every day | ORAL | 3 refills | Status: DC
  Filled 2022-06-22: qty 30, 30d supply, fill #0

## 2022-06-22 MED ORDER — ATORVASTATIN CALCIUM 20 MG PO TABS
20.0000 mg | ORAL_TABLET | Freq: Every day | ORAL | 3 refills | Status: DC
  Filled 2022-06-22: qty 30, 30d supply, fill #0

## 2022-06-22 MED ORDER — ALBUTEROL SULFATE HFA 108 (90 BASE) MCG/ACT IN AERS
2.0000 | INHALATION_SPRAY | RESPIRATORY_TRACT | 1 refills | Status: DC | PRN
  Filled 2022-06-22: qty 18, 25d supply, fill #0

## 2022-06-22 MED ORDER — VARENICLINE TARTRATE 1 MG PO TABS
1.0000 mg | ORAL_TABLET | Freq: Two times a day (BID) | ORAL | 1 refills | Status: DC
  Filled 2022-06-22: qty 60, 30d supply, fill #0

## 2022-06-22 MED ORDER — OMEPRAZOLE 20 MG PO CPDR
DELAYED_RELEASE_CAPSULE | Freq: Every day | ORAL | 3 refills | Status: DC
  Filled 2022-06-22: qty 30, 30d supply, fill #0

## 2022-06-22 NOTE — Addendum Note (Signed)
Addended by: Anders Simmonds on: 06/22/2022 09:41 AM   Modules accepted: Orders

## 2022-06-22 NOTE — Progress Notes (Signed)
Patient ID: Christopher Douglas, male   DOB: 04-09-1991, 31 y.o.   MRN: [id number] Not taking lisinopril.  Taking 60 units insulin once daily    Christopher Douglas, is a 31 y.o. male  [id number]:[id number]  [id number]  DOB - 28-Aug-1991  Chief Complaint  Patient presents with   Diabetes   Medication Refill       Subjective:   Christopher Douglas is a 31 y.o. male here today for med refills and diabetes check.  He has not been taking lisinopril.  He is taking 60 units basaglar daily(he is prescribed 38 units bid but not taking it that way.  Blood sugars running 180-low to mid 300s at home.  Needs RF and labs.  Not following diabetic diet.  No polyuria/polydipsia or vision changes  No problems updated.  ALLERGIES: Allergies  Allergen Reactions   Amoxicillin Hives    Did it involve swelling of the face/tongue/throat, SOB, or low BP? Unknown Did it involve sudden or severe rash/hives, skin peeling, or any reaction on the inside of your mouth or nose? Yes Did you need to seek medical attention at a hospital or doctor's office? Unknown When did it last happen? childhood       If all above answers are "NO", may proceed with cephalosporin use.    Shellfish Allergy Hives    Specifically shrimp    PAST MEDICAL HISTORY: Past Medical History:  Diagnosis Date   Asthma    Diabetes mellitus without complication ([place])    Hypertension     MEDICATIONS AT HOME: Prior to Admission medications   Medication Sig Start Date End Date Taking? Authorizing Provider  albuterol (VENTOLIN HFA) 108 (90 Base) MCG/ACT inhaler Inhale 2 puffs into the lungs every 4 (four) hours as needed for wheezing or shortness of breath. 06/22/22   Argentina Donovan, PA-C  atorvastatin (LIPITOR) 20 MG tablet Take 1 tablet (20 mg total) by mouth daily. 06/22/22   Argentina Donovan, PA-C  Blood Glucose Monitoring Suppl (ONE TOUCH ULTRA MINI) w/Device KIT 1 each by Does not apply route 3 (three) times daily. 11/24/17   Charlott Rakes, MD  cetirizine  (ZYRTEC) 10 MG tablet Take 1 tablet (10 mg total) by mouth daily. 11/11/20   Charlott Rakes, MD  fluticasone (FLONASE) 50 MCG/ACT nasal spray Place 2 sprays into both nostrils daily. 11/11/20   Charlott Rakes, MD  gabapentin (NEURONTIN) 300 MG capsule Take 1 capsule (300 mg total) by mouth 2 (two) times daily. 06/22/22   Argentina Donovan, PA-C  glucose blood (ONE TOUCH ULTRA TEST) test strip Use as instructed three times daily before meals 11/11/19   Charlott Rakes, MD  ibuprofen (ADVIL) 800 MG tablet Take 1 tablet (800 mg total) by mouth 3 (three) times daily. 04/05/19   Raylene Everts, MD  Insulin Glargine [facility]) 100 UNIT/ML Inject 35 Units into the skin 2 (two) times daily. 06/22/22   Argentina Donovan, PA-C  insulin lispro (HUMALOG KWIKPEN) 100 UNIT/ML KwikPen Inject 0-12 Units into the skin 3 (three) times daily before meals. 06/22/22   Argentina Donovan, PA-C  Insulin Pen Needle 31G X 5 MM MISC 1 each by Does not apply route 4 (four) times daily -  with meals and at bedtime. 06/22/22   Argentina Donovan, PA-C  Insulin Syringe-Needle U-100 (TRUEPLUS INSULIN SYRINGE) 31G X 5/16" 0.3 ML MISC Use to inject Novolog TID. 06/22/22   Argentina Donovan, PA-C  Lancets [facility] ULTRASOFT) lancets Use as instructed 3 times  daily before meals 11/03/20   Charlott Rakes, MD  lisinopril (ZESTRIL) 10 MG tablet Take 1 tablet (10 mg total) by mouth daily. 06/22/22   Argentina Donovan, PA-C  montelukast (SINGULAIR) 10 MG tablet Take 1 tablet (10 mg total) by mouth at bedtime. 11/11/20   Charlott Rakes, MD  omeprazole (PRILOSEC) 20 MG capsule TAKE 1 CAPSULE (20 MG TOTAL) BY MOUTH DAILY. 06/22/22 06/22/23  Argentina Donovan, PA-C    ROS: Neg HEENT Neg resp Neg cardiac Neg GI Neg GU Neg MS Neg psych Neg neuro  Objective:   Vitals:   06/22/22 0847  BP: 131/85  Pulse: 71  SpO2: 98%  Weight: 248 lb (112.5 kg)  Height: '6\' 3"'  (1.905 m)   Exam General appearance : Awake, alert, not in any  distress. Speech Clear. Not toxic looking HEENT: Atraumatic and Normocephalic Neck: Supple, no JVD. No cervical lymphadenopathy.  Chest: Good air entry bilaterally, CTAB.  No rales/rhonchi/wheezing CVS: S1 S2 regular, no murmurs.  Extremities: B/L Lower Ext shows no edema, both legs are warm to touch Neurology: Awake alert, and oriented X 3, CN II-XII intact, Non focal Skin: No Rash  Data Review Lab Results  Component Value Date   HGBA1C 11.6 (A) 06/22/2022   HGBA1C 12.3 (H) 12/14/2020   HGBA1C 10.5 (A) 05/28/2020    Assessment & Plan   1. Type 1 diabetes mellitus with hyperglycemia (HCC) Noncompliant with medication regimen(he has been taking 60 units once daily of basaglar)  split dosing of basaglar at 35 units bid.  Compliance imperative Check blood sugars fasting and at bedtimes. Keep a record of these readings and bring to next visit.   Drink 80-100 ounces water daily - Glucose (CBG) - HgB A1c - atorvastatin (LIPITOR) 20 MG tablet; Take 1 tablet (20 mg total) by mouth daily.  Dispense: 30 tablet; Refill: 3 - Insulin Syringe-Needle U-100 (TRUEPLUS INSULIN SYRINGE) 31G X 5/16" 0.3 ML MISC; Use to inject Novolog TID.  Dispense: 100 each; Refill: 2 - lisinopril (ZESTRIL) 10 MG tablet; Take 1 tablet (10 mg total) by mouth daily.  Dispense: 30 tablet; Refill: 3 - Insulin Pen Needle 31G X 5 MM MISC; 1 each by Does not apply route 4 (four) times daily -  with meals and at bedtime.  Dispense: 100 each; Refill: 0 - Insulin Glargine (BASAGLAR KWIKPEN) 100 UNIT/ML; Inject 35 Units into the skin 2 (two) times daily.  Dispense: 24 mL; Refill: 5 - insulin lispro (HUMALOG KWIKPEN) 100 UNIT/ML KwikPen; Inject 0-12 Units into the skin 3 (three) times daily before meals.  Dispense: 12 mL; Refill: 4 - Comprehensive metabolic panel  2. 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  Dispense: 60 capsule; Refill: 3 - Insulin Pen Needle 31G X 5 MM MISC; 1 each by Does not apply route 4 (four) times daily -  with meals and at bedtime.  Dispense: 100 each; Refill: 0  3. Essential hypertension Please take Lisinopril-educated on kidney protection - lisinopril (ZESTRIL) 10 MG tablet; Take 1 tablet (10 mg total) by mouth daily.  Dispense: 30 tablet; Refill: 3 - CBC with Differential/Platelet  4. Mild intermittent asthma without complication - albuterol (VENTOLIN HFA) 108 (90 Base) MCG/ACT inhaler; Inhale 2 puffs into the lungs every 4 (four) hours as needed for wheezing or shortness of breath.  Dispense: 18 g; Refill: 1  5. Gastroesophageal reflux  disease without esophagitis - omeprazole (PRILOSEC) 20 MG capsule; TAKE 1 CAPSULE (20 MG TOTAL) BY MOUTH DAILY.  Dispense: 30 capsule; Refill: 3 - CBC with Differential/Platelet  6. Non-compliance - lisinopril (ZESTRIL) 10 MG tablet; Take 1 tablet (10 mg total) by mouth daily.  Dispense: 30 tablet; Refill: 3  7. Hyperlipidemia, unspecified hyperlipidemia type - Lipid panel - CBC with Differential/Platelet    Return in about 3 weeks (around 07/13/2022) for [facility] in 3 weeks for DM and PCP in 3 months.  The patient was given clear instructions to go to ER or return to medical center if symptoms don't improve, worsen or new problems develop. The patient verbalized understanding. The patient was told to call to get lab results if they haven't heard anything in the next week.      Freeman Caldron, PA-C [facility] and [facility] [place], [place]   06/22/2022, 8:59 AM

## 2022-06-22 NOTE — Patient Instructions (Signed)
Check blood sugars fasting and at bedtimes.  Keep a record of these readings and bring to next visit.    Drink 80-100 ounces water daily

## 2022-06-23 LAB — COMPREHENSIVE METABOLIC PANEL
ALT: 11 IU/L (ref 0–44)
AST: 12 IU/L (ref 0–40)
Albumin/Globulin Ratio: 1.6 (ref 1.2–2.2)
Albumin: 3.9 g/dL — ABNORMAL LOW (ref 4.1–5.1)
Alkaline Phosphatase: 70 IU/L (ref 44–121)
BUN/Creatinine Ratio: 7 — ABNORMAL LOW (ref 9–20)
BUN: 8 mg/dL (ref 6–20)
Bilirubin Total: 0.3 mg/dL (ref 0.0–1.2)
CO2: 22 mmol/L (ref 20–29)
Calcium: 9 mg/dL (ref 8.7–10.2)
Chloride: 103 mmol/L (ref 96–106)
Creatinine, Ser: 1.09 mg/dL (ref 0.76–1.27)
Globulin, Total: 2.4 g/dL (ref 1.5–4.5)
Glucose: 240 mg/dL — ABNORMAL HIGH (ref 70–99)
Potassium: 4.4 mmol/L (ref 3.5–5.2)
Sodium: 140 mmol/L (ref 134–144)
Total Protein: 6.3 g/dL (ref 6.0–8.5)
eGFR: 92 mL/min/{1.73_m2} (ref >59)

## 2022-06-23 LAB — CBC WITH DIFFERENTIAL/PLATELET
Basophils Absolute: 0 10*3/uL (ref 0.0–0.2)
Basos: 1 %
EOS (ABSOLUTE): 0.2 10*3/uL (ref 0.0–0.4)
Eos: 3 %
Hematocrit: 45.3 % (ref 37.5–51.0)
Hemoglobin: 14.9 g/dL (ref 13.0–17.7)
Immature Grans (Abs): 0 10*3/uL (ref 0.0–0.1)
Immature Granulocytes: 0 %
Lymphocytes Absolute: 2.2 10*3/uL (ref 0.7–3.1)
Lymphs: 39 %
MCH: 27.2 pg (ref 26.6–33.0)
MCHC: 32.9 g/dL (ref 31.5–35.7)
MCV: 83 fL (ref 79–97)
Monocytes Absolute: 0.6 10*3/uL (ref 0.1–0.9)
Monocytes: 10 %
Neutrophils Absolute: 2.8 10*3/uL (ref 1.4–7.0)
Neutrophils: 47 %
Platelets: 197 10*3/uL (ref 150–450)
RBC: 5.47 x10E6/uL (ref 4.14–5.80)
RDW: 13.1 % (ref 11.6–15.4)
WBC: 5.8 10*3/uL (ref 3.4–10.8)

## 2022-06-23 LAB — LIPID PANEL
Chol/HDL Ratio: 2.9 ratio (ref 0.0–5.0)
Cholesterol, Total: 187 mg/dL (ref 100–199)
HDL: 65 mg/dL (ref >39)
LDL Chol Calc (NIH): 109 mg/dL — ABNORMAL HIGH (ref 0–99)
Triglycerides: 68 mg/dL (ref 0–149)
VLDL Cholesterol Cal: 13 mg/dL (ref 5–40)

## 2022-07-28 ENCOUNTER — Encounter: Payer: Self-pay | Admitting: Pharmacist

## 2022-07-28 ENCOUNTER — Other Ambulatory Visit: Payer: Self-pay

## 2022-07-28 ENCOUNTER — Ambulatory Visit: Payer: Self-pay | Attending: Family Medicine | Admitting: Pharmacist

## 2022-07-28 DIAGNOSIS — E1065 Type 1 diabetes mellitus with hyperglycemia: Secondary | ICD-10-CM

## 2022-07-28 MED ORDER — VARENICLINE TARTRATE 0.5 MG PO TABS
ORAL_TABLET | ORAL | 0 refills | Status: DC
  Filled 2022-07-28: qty 11, 4d supply, fill #0

## 2022-07-28 MED ORDER — VARENICLINE TARTRATE 1 MG PO TABS
1.0000 mg | ORAL_TABLET | Freq: Two times a day (BID) | ORAL | 1 refills | Status: DC
  Filled 2022-07-28: qty 60, 30d supply, fill #0

## 2022-07-28 MED ORDER — FREESTYLE LIBRE 2 SENSOR MISC: 3 refills | Status: DC

## 2022-07-28 MED ORDER — FREESTYLE LIBRE 2 READER DEVI: 0 refills | Status: DC

## 2022-07-28 MED ORDER — TRUE METRIX METER W/DEVICE KIT
PACK | 0 refills | Status: AC
  Filled 2022-07-28: qty 1, 1d supply, fill #0

## 2022-07-28 MED ORDER — TRUEPLUS LANCETS 28G MISC
3 refills | Status: AC
  Filled 2022-07-28: qty 100, 33d supply, fill #0

## 2022-07-28 MED ORDER — TRUE METRIX BLOOD GLUCOSE TEST VI STRP
ORAL_STRIP | 3 refills | Status: DC
  Filled 2022-07-28: qty 100, 33d supply, fill #0
  Filled 2022-11-22 (×2): qty 100, 33d supply, fill #1
  Filled 2023-03-31 – 2023-04-14 (×2): qty 100, 33d supply, fill #2

## 2022-07-28 MED ORDER — BASAGLAR KWIKPEN 100 UNIT/ML ~~LOC~~ SOPN
60.0000 [IU] | PEN_INJECTOR | Freq: Every day | SUBCUTANEOUS | 5 refills | Status: DC
  Filled 2022-07-28: qty 24, 40d supply, fill #0
  Filled 2022-11-22: qty 24, 40d supply, fill #1
  Filled 2023-01-26: qty 24, 40d supply, fill #2
  Filled 2023-03-31 – 2023-04-14 (×2): qty 24, 40d supply, fill #3
  Filled 2023-07-06: qty 24, 40d supply, fill #4

## 2022-07-28 MED ORDER — INSULIN LISPRO (1 UNIT DIAL) 100 UNIT/ML (KWIKPEN)
10.0000 [IU] | PEN_INJECTOR | Freq: Three times a day (TID) | SUBCUTANEOUS | 4 refills | Status: DC
  Filled 2022-07-28: qty 12, 40d supply, fill #0
  Filled 2022-11-22: qty 12, 40d supply, fill #1
  Filled 2023-01-26: qty 12, 40d supply, fill #2
  Filled 2023-03-31 – 2023-04-14 (×2): qty 12, 40d supply, fill #3
  Filled 2023-07-06: qty 12, 40d supply, fill #4

## 2022-07-28 NOTE — Progress Notes (Signed)
    S:     No chief complaint on file.  Christopher Douglas is a 31 y.o. male who presents for diabetes evaluation, education, and management.  PMH is significant for T1DM, HTN, GERD, asthma, HLD.  Patient was referred and last seen by Marylene Land on 06/22/2022.   At last visit, he was found to be without his medications and A1c was up to 11.6%. His medications were refilled and adherence encouraged.  Today, patient arrives in good spirits and presents without any assistance.   Patient reports Diabetes was diagnosed in 2014 as T1DM. He has been on insulin since. He endorses compliance with his regimen since his last visit with Marylene Land and does not have any concerns today. Of note, he does inquire about CGM and tells me he used to experience better control if his basal insulin was dosed once vs. BID.    Family/Social History:  -Fhx: DM, HTN, alcohol abuse  -Tobacco: current 0.25 PPD smoker  -Alcohol: none reported   Current diabetes medications include: Basaglar 35u BID (pt takes 30u BID), Humalog SSI - reports taking 10u TID after meals (0.5-1 hr after meals)  Patient reports adherence to taking all medications as prescribed.   Insurance coverage: none  Patient denies hypoglycemic events.  Reported home fasting blood sugars: 170 - 220 since his last visit with Marylene Land.    Patient reports nocturia (nighttime urination).  Patient denies neuropathy (nerve pain). Patient denies visual changes. Patient reports self foot exams.   Patient reported dietary habits:  -Endorses using portion control.  -On some days of the week, he will only snack. -Almost always skips lunch  Patient-reported exercise habits:  -None outside of work.  -He is a single dad and takes care of his 66 YO daughter. He is active with her at home.    O:   ROS  Physical Exam  7 day average blood glucose: no meter today. No CGM in place.   Lab Results  Component Value Date   HGBA1C 11.6 (A) 06/22/2022   There were  no vitals filed for this visit.  Lipid Panel     Component Value Date/Time   CHOL 187 06/22/2022 0904   TRIG 68 06/22/2022 0904   HDL 65 06/22/2022 0904   CHOLHDL 2.9 06/22/2022 0904   CHOLHDL 4 09/16/2014 1546   VLDL 24.8 09/16/2014 1546   LDLCALC 109 (H) 06/22/2022 0904    Clinical Atherosclerotic Cardiovascular Disease (ASCVD): No  The ASCVD Risk score (Arnett DK, et al., 2019) failed to calculate for the following reasons:   The 2019 ASCVD risk score is only valid for ages 59 to 2   A/P: Diabetes longstanding currently uncontrolled. Patient is able to verbalize appropriate hypoglycemia management plan. Medication adherence appears appropriate. -Started Basaglar 60u daily.  -Started Humalog 10u TID BEFORE meals. Will likely need to adjust his insulin at future visits to include a more 50/50 split between basal/bolus. -True Metrix sent downstairs. -Will sent Freestyle Libre to his [facility] so he can inquire about the cash price.   -Extensively discussed pathophysiology of diabetes, recommended lifestyle interventions, dietary effects on blood sugar control.  -Counseled on s/sx of and management of hypoglycemia.  -Next A1c anticipated 09/2022.   Written patient instructions provided. Patient verbalized understanding of treatment plan.  Total time in face to face counseling 30 minutes.    Follow-up:  Pharmacist in 1 month.  Butch Penny, PharmD, Patsy [place], CPP Clinical Pharmacist [facility] [phone]

## 2022-08-03 ENCOUNTER — Other Ambulatory Visit: Payer: Self-pay

## 2022-08-04 ENCOUNTER — Other Ambulatory Visit: Payer: Self-pay

## 2022-08-29 ENCOUNTER — Ambulatory Visit: Payer: Self-pay | Admitting: Pharmacist

## 2022-09-03 ENCOUNTER — Telehealth: Payer: Self-pay | Admitting: Physician Assistant

## 2022-09-03 NOTE — Progress Notes (Signed)
Patient was advised via messaging he would need in person evaluation for STI testing. Message was read, but appt was not cancelled. Provider logged in to visit and waited over 10 minutes with no attempts for patient to log in. Marked no charge

## 2022-10-03 ENCOUNTER — Ambulatory Visit: Payer: Self-pay | Admitting: Pharmacist

## 2022-11-22 ENCOUNTER — Other Ambulatory Visit: Payer: Self-pay

## 2022-12-16 ENCOUNTER — Telehealth: Payer: Self-pay | Admitting: Physician Assistant

## 2022-12-16 ENCOUNTER — Other Ambulatory Visit: Payer: Self-pay

## 2022-12-16 ENCOUNTER — Ambulatory Visit: Payer: Self-pay | Admitting: *Deleted

## 2022-12-16 DIAGNOSIS — J069 Acute upper respiratory infection, unspecified: Secondary | ICD-10-CM

## 2022-12-16 DIAGNOSIS — B9689 Other specified bacterial agents as the cause of diseases classified elsewhere: Secondary | ICD-10-CM

## 2022-12-16 MED ORDER — DOXYCYCLINE HYCLATE 100 MG PO TABS
100.0000 mg | ORAL_TABLET | Freq: Two times a day (BID) | ORAL | 0 refills | Status: DC
  Filled 2022-12-16: qty 20, 10d supply, fill #0

## 2022-12-16 MED ORDER — FLUTICASONE PROPIONATE HFA 110 MCG/ACT IN AERO
1.0000 | INHALATION_SPRAY | Freq: Two times a day (BID) | RESPIRATORY_TRACT | 12 refills | Status: DC
  Filled 2022-12-16: qty 1, fill #0

## 2022-12-16 MED ORDER — ALBUTEROL SULFATE HFA 108 (90 BASE) MCG/ACT IN AERS
1.0000 | INHALATION_SPRAY | Freq: Four times a day (QID) | RESPIRATORY_TRACT | 0 refills | Status: DC | PRN
  Filled 2022-12-16: qty 6.7, 25d supply, fill #0

## 2022-12-16 MED ORDER — PROMETHAZINE-DM 6.25-15 MG/5ML PO SYRP
5.0000 mL | ORAL_SOLUTION | Freq: Four times a day (QID) | ORAL | 0 refills | Status: DC | PRN
  Filled 2022-12-16: qty 118, 6d supply, fill #0

## 2022-12-16 NOTE — Progress Notes (Signed)
Virtual Visit Consent   Christopher Douglas, you are scheduled for a virtual visit with a [facility] provider today. Just as with appointments in the office, your consent must be obtained to participate. Your consent will be active for this visit and any virtual visit you may have with one of our providers in the next 365 days. If you have a MyChart account, a copy of this consent can be sent to you electronically.  As this is a virtual visit, video technology does not allow for your provider to perform a traditional examination. This may limit your provider's ability to fully assess your condition. If your provider identifies any concerns that need to be evaluated in person or the need to arrange testing (such as labs, EKG, etc.), we will make arrangements to do so. Although advances in technology are sophisticated, we cannot ensure that it will always work on either your end or our end. If the connection with a video visit is poor, the visit may have to be switched to a telephone visit. With either a video or telephone visit, we are not always able to ensure that we have a secure connection.  By engaging in this virtual visit, you consent to the provision of healthcare and authorize for your insurance to be billed (if applicable) for the services provided during this visit. Depending on your insurance coverage, you may receive a charge related to this service.  I need to obtain your verbal consent now. Are you willing to proceed with your visit today? Christopher Douglas has provided verbal consent on 12/16/2022 for a virtual visit (video or telephone). Mar Daring, PA-C  Date: 12/16/2022 1:06 PM  Virtual Visit via Video Note   I, Mar Daring, connected with  Christopher Douglas  ([id number], Jun 16, 1991) on 12/16/22 at  1:00 PM EST by a video-enabled telemedicine application and verified that I am speaking with the correct person using two identifiers.  Location: Patient: Virtual Visit Location  Patient: Home Provider: Virtual Visit Location Provider: Office/Clinic   I discussed the limitations of evaluation and management by telemedicine and the availability of in person appointments. The patient expressed understanding and agreed to proceed.    History of Present Illness: Christopher Douglas is a 32 y.o. who identifies as a male who was assigned male at birth, and is being seen today for cough.  HPI: Cough This is a new problem. The current episode started 1 to 4 weeks ago (1 week). The problem has been gradually worsening. The problem occurs every few minutes. The cough is Productive of sputum and productive of purulent sputum. Associated symptoms include chest pain (tightness), chills, ear congestion, headaches, myalgias, nasal congestion, postnasal drip, a sore throat (from coughing), shortness of breath and wheezing (last night). Pertinent negatives include no ear pain, fever (highest 99), rhinorrhea or sweats. The symptoms are aggravated by lying down. He has tried a beta-agonist inhaler and rest (ibuprofen, sinus decongestant) for the symptoms. The treatment provided no relief. His past medical history is significant for asthma and bronchitis. There is no history of pneumonia.      Problems:  Patient Active Problem List   Diagnosis Date Noted   Breast abscess 05/13/2019   Tobacco dependence 05/13/2019   GERD (gastroesophageal reflux disease) 05/12/2017   Hyperlipidemia 04/06/2016   Hypertension 06/29/2015   Diabetic neuropathy ([place]) 09/16/2014   DM type 1 (diabetes mellitus, type 1) ([place]) 01/04/2013   Unspecified constipation 01/04/2013   Asthma 01/03/2013  Allergies:  Allergies  Allergen Reactions   Amoxicillin Hives    Did it involve swelling of the face/tongue/throat, SOB, or low BP? Unknown Did it involve sudden or severe rash/hives, skin peeling, or any reaction on the inside of your mouth or nose? Yes Did you need to seek medical attention at a hospital or doctor's  office? Unknown When did it last happen? childhood       If all above answers are "NO", may proceed with cephalosporin use.    Shellfish Allergy Hives    Specifically shrimp   Medications:  Current Outpatient Medications:    albuterol (VENTOLIN HFA) 108 (90 Base) MCG/ACT inhaler, Inhale 1-2 puffs into the lungs every 6 (six) hours as needed., Disp: 18 g, Rfl: 0   doxycycline (VIBRA-TABS) 100 MG tablet, Take 1 tablet (100 mg total) by mouth 2 (two) times daily., Disp: 20 tablet, Rfl: 0   fluticasone (FLOVENT HFA) 110 MCG/ACT inhaler, Inhale 1 puff into the lungs in the morning and at bedtime., Disp: 1 each, Rfl: 12   promethazine-dextromethorphan (PROMETHAZINE-DM) 6.25-15 MG/5ML syrup, Take 5 mLs by mouth 4 (four) times daily as needed for cough., Disp: 118 mL, Rfl: 0   atorvastatin (LIPITOR) 20 MG tablet, Take 1 tablet (20 mg total) by mouth daily., Disp: 30 tablet, Rfl: 3   Blood Glucose Monitoring Suppl (TRUE METRIX METER) w/Device KIT, Use as instructed three times daily before meals, Disp: 1 kit, Rfl: 0   cetirizine (ZYRTEC) 10 MG tablet, Take 1 tablet (10 mg total) by mouth daily., Disp: 30 tablet, Rfl: 1   Continuous Blood Gluc Receiver (FREESTYLE LIBRE 2 READER) DEVI, Use as instructed three times daily before meals. E10.65, Disp: 1 each, Rfl: 0   Continuous Blood Gluc Sensor (FREESTYLE LIBRE 2 SENSOR) MISC, Use as instructed three times daily before meals. Change sensors every 14 days. E10.65, Disp: 2 each, Rfl: 3   fluticasone (FLONASE) 50 MCG/ACT nasal spray, Place 2 sprays into both nostrils daily., Disp: 16 g, Rfl: 6   gabapentin (NEURONTIN) 300 MG capsule, Take 1 capsule (300 mg total) by mouth 2 (two) times daily., Disp: 60 capsule, Rfl: 3   glucose blood (TRUE METRIX BLOOD GLUCOSE TEST) test strip, Use as instructed three times daily before meals, Disp: 100 each, Rfl: 3   ibuprofen (ADVIL) 800 MG tablet, Take 1 tablet (800 mg total) by mouth 3 (three) times daily., Disp: 21  tablet, Rfl: 0   Insulin Glargine (BASAGLAR KWIKPEN) 100 UNIT/ML, Inject 60 Units into the skin daily., Disp: 24 mL, Rfl: 5   insulin lispro (HUMALOG KWIKPEN) 100 UNIT/ML KwikPen, Inject 10 Units into the skin 3 (three) times daily., Disp: 12 mL, Rfl: 4   Insulin Pen Needle 31G X 5 MM MISC, Use 4 (four) times daily -  with meals and at bedtime., Disp: 100 each, Rfl: 0   Insulin Syringe-Needle U-100 (TRUEPLUS INSULIN SYRINGE) 31G X 5/16" 0.3 ML MISC, Use to inject Novolog Three times daily, Disp: 100 each, Rfl: 2   lisinopril (ZESTRIL) 10 MG tablet, Take 1 tablet (10 mg total) by mouth daily., Disp: 30 tablet, Rfl: 3   montelukast (SINGULAIR) 10 MG tablet, Take 1 tablet (10 mg total) by mouth at bedtime., Disp: 30 tablet, Rfl: 1   omeprazole (PRILOSEC) 20 MG capsule, TAKE 1 CAPSULE (20 MG TOTAL) BY MOUTH DAILY., Disp: 30 capsule, Rfl: 3   TRUEplus Lancets 28G MISC, Use as instructed three times daily before meals, Disp: 100 each, Rfl: 3  varenicline (CHANTIX) 0.5 MG tablet, Take 1 tablet (0.5mg ) daily x3 days then 1 tablet (0.5mg ) BID for 4 days., Disp: 11 tablet, Rfl: 0   varenicline (CHANTIX) 1 MG tablet, Take 1 tablet (1 mg total) by mouth 2 (two) times daily., Disp: 60 tablet, Rfl: 1  Observations/Objective: Patient is well-developed, well-nourished in no acute distress.  Resting comfortably at home.  Head is normocephalic, atraumatic.  No labored breathing.  Speech is clear and coherent with logical content.  Patient is alert and oriented at baseline.    Assessment and Plan: 1. Bacterial upper respiratory infection - doxycycline (VIBRA-TABS) 100 MG tablet; Take 1 tablet (100 mg total) by mouth 2 (two) times daily.  Dispense: 20 tablet; Refill: 0 - fluticasone (FLOVENT HFA) 110 MCG/ACT inhaler; Inhale 1 puff into the lungs in the morning and at bedtime.  Dispense: 1 each; Refill: 12 - albuterol (VENTOLIN HFA) 108 (90 Base) MCG/ACT inhaler; Inhale 1-2 puffs into the lungs every 6 (six)  hours as needed.  Dispense: 18 g; Refill: 0 - promethazine-dextromethorphan (PROMETHAZINE-DM) 6.25-15 MG/5ML syrup; Take 5 mLs by mouth 4 (four) times daily as needed for cough.  Dispense: 118 mL; Refill: 0  - Worsening over a week despite OTC medications - Will treat with Doxycycline, Albuterol refilled, Flovent added (Diabetic patient so trying to avoid oral steroids if possible) and Promethazine DM cough syrup - Can continue Mucinex  - Push fluids.  - Rest.  - Steam and humidifier can help - Seek in person evaluation if worsening or symptoms fail to improve    Follow Up Instructions: I discussed the assessment and treatment plan with the patient. The patient was provided an opportunity to ask questions and all were answered. The patient agreed with the plan and demonstrated an understanding of the instructions.  A copy of instructions were sent to the patient via MyChart unless otherwise noted below.   Patient has requested to receive PHI (AVS, Work Notes, etc) pertaining to this video visit through e-mail as they are currently without active MyChart. They have voiced understand that email is not considered secure and their health information could be viewed by someone other than the patient.   The patient was advised to call back or seek an in-person evaluation if the symptoms worsen or if the condition fails to improve as anticipated.  Time:  I spent 8 minutes with the patient via telehealth technology discussing the above problems/concerns.    Margaretann Loveless, PA-C

## 2022-12-16 NOTE — Telephone Encounter (Signed)
Summary: Cough Advice   Pt is calling to report cough with phelm yellow greenish in color for a few days. No available appts at [facility]. Pt is interested in a virtual option for an appt. Please advise         Reason for Disposition  [1] MILD difficulty breathing (e.g., minimal/no SOB at rest, SOB with walking, pulse <100) AND [2] still present when not coughing  Answer Assessment - Initial Assessment Questions 1. ONSET: "When did the cough begin?"      Saturday- almost 1 week 2. SEVERITY: "How bad is the cough today?"      Coughing during the night 3. SPUTUM: "Describe the color of your sputum" (none, dry cough; clear, white, yellow, green)     Yellow/greenish 4. HEMOPTYSIS: "Are you coughing up any blood?" If so ask: "How much?" (flecks, streaks, tablespoons, etc.)     no 5. DIFFICULTY BREATHING: "Are you having difficulty breathing?" If Yes, ask: "How bad is it?" (e.g., mild, moderate, severe)    - MILD: No SOB at rest, mild SOB with walking, speaks normally in sentences, can lie down, no retractions, pulse < 100.    - MODERATE: SOB at rest, SOB with minimal exertion and prefers to sit, cannot lie down flat, speaks in phrases, mild retractions, audible wheezing, pulse 100-120.    - SEVERE: Very SOB at rest, speaks in single words, struggling to breathe, sitting hunched forward, retractions, pulse > 120      Chest tightness- had to use inhaler last night- mild 6. FEVER: "Do you have a fever?" If Yes, ask: "What is your temperature, how was it measured, and when did it start?"     99 last night 7. CARDIAC HISTORY: "Do you have any history of heart disease?" (e.g., heart attack, congestive heart failure)      no 8. LUNG HISTORY: "Do you have any history of lung disease?"  (e.g., pulmonary embolus, asthma, emphysema)     asthma 9. PE RISK FACTORS: "Do you have a history of blood clots?" (or: recent major surgery, recent prolonged travel, bedridden)     no 10. OTHER SYMPTOMS: "Do you have any  other symptoms?" (e.g., runny nose, wheezing, chest pain)       Sinus pressure  Protocols used: Cough - Acute Productive-A-AH

## 2022-12-16 NOTE — Telephone Encounter (Signed)
  Chief Complaint: cough Symptoms: cough- productive, yellow/green sputum, chest tightness- using inhalers Frequency: 6 days Pertinent Negatives: Patient denies fever Disposition: [] ED /[] Urgent Care (no appt availability in office) / [] Appointment(In office/virtual)/ [x]  [facility]/ [] Home Care/ [] Refused Recommended Disposition /[] [facility]/ []  Follow-up with PCP Additional Notes: Patient has been scheduled for UC virtual visit.

## 2022-12-16 NOTE — Telephone Encounter (Signed)
Per epic patient had a virtual visit.

## 2022-12-16 NOTE — Patient Instructions (Signed)
Christopher Douglas, thank you for joining Mar Daring, PA-C for today's virtual visit.  While this provider is not your primary care provider (PCP), if your PCP is located in our provider database this encounter information will be shared with them immediately following your visit.   [facility] account gives you access to today's visit and all your visits, tests, and labs performed at [facility] " click here if you don't have a [facility] account or go to [email or web address]  Consent: (Patient) Christopher Douglas provided verbal consent for this virtual visit at the beginning of the encounter.  Current Medications:  Current Outpatient Medications:    albuterol (VENTOLIN HFA) 108 (90 Base) MCG/ACT inhaler, Inhale 1-2 puffs into the lungs every 6 (six) hours as needed., Disp: 18 g, Rfl: 0   doxycycline (VIBRA-TABS) 100 MG tablet, Take 1 tablet (100 mg total) by mouth 2 (two) times daily., Disp: 20 tablet, Rfl: 0   fluticasone (FLOVENT HFA) 110 MCG/ACT inhaler, Inhale 1 puff into the lungs in the morning and at bedtime., Disp: 1 each, Rfl: 12   promethazine-dextromethorphan (PROMETHAZINE-DM) 6.25-15 MG/5ML syrup, Take 5 mLs by mouth 4 (four) times daily as needed for cough., Disp: 118 mL, Rfl: 0   atorvastatin (LIPITOR) 20 MG tablet, Take 1 tablet (20 mg total) by mouth daily., Disp: 30 tablet, Rfl: 3   Blood Glucose Monitoring Suppl (TRUE METRIX METER) w/Device KIT, Use as instructed three times daily before meals, Disp: 1 kit, Rfl: 0   cetirizine (ZYRTEC) 10 MG tablet, Take 1 tablet (10 mg total) by mouth daily., Disp: 30 tablet, Rfl: 1   Continuous Blood Gluc Receiver (FREESTYLE LIBRE 2 READER) DEVI, Use as instructed three times daily before meals. E10.65, Disp: 1 each, Rfl: 0   Continuous Blood Gluc Sensor (FREESTYLE LIBRE 2 SENSOR) MISC, Use as instructed three times daily before meals. Change sensors every 14 days. E10.65, Disp: 2 each, Rfl: 3    fluticasone (FLONASE) 50 MCG/ACT nasal spray, Place 2 sprays into both nostrils daily., Disp: 16 g, Rfl: 6   gabapentin (NEURONTIN) 300 MG capsule, Take 1 capsule (300 mg total) by mouth 2 (two) times daily., Disp: 60 capsule, Rfl: 3   glucose blood (TRUE METRIX BLOOD GLUCOSE TEST) test strip, Use as instructed three times daily before meals, Disp: 100 each, Rfl: 3   ibuprofen (ADVIL) 800 MG tablet, Take 1 tablet (800 mg total) by mouth 3 (three) times daily., Disp: 21 tablet, Rfl: 0   Insulin Glargine (BASAGLAR KWIKPEN) 100 UNIT/ML, Inject 60 Units into the skin daily., Disp: 24 mL, Rfl: 5   insulin lispro (HUMALOG KWIKPEN) 100 UNIT/ML KwikPen, Inject 10 Units into the skin 3 (three) times daily., Disp: 12 mL, Rfl: 4   Insulin Pen Needle 31G X 5 MM MISC, Use 4 (four) times daily -  with meals and at bedtime., Disp: 100 each, Rfl: 0   Insulin Syringe-Needle U-100 (TRUEPLUS INSULIN SYRINGE) 31G X 5/16" 0.3 ML MISC, Use to inject Novolog Three times daily, Disp: 100 each, Rfl: 2   lisinopril (ZESTRIL) 10 MG tablet, Take 1 tablet (10 mg total) by mouth daily., Disp: 30 tablet, Rfl: 3   montelukast (SINGULAIR) 10 MG tablet, Take 1 tablet (10 mg total) by mouth at bedtime., Disp: 30 tablet, Rfl: 1   omeprazole (PRILOSEC) 20 MG capsule, TAKE 1 CAPSULE (20 MG TOTAL) BY MOUTH DAILY., Disp: 30 capsule, Rfl: 3   TRUEplus Lancets 28G MISC, Use as instructed three  times daily before meals, Disp: 100 each, Rfl: 3   varenicline (CHANTIX) 0.5 MG tablet, Take 1 tablet (0.5mg ) daily x3 days then 1 tablet (0.5mg ) BID for 4 days., Disp: 11 tablet, Rfl: 0   varenicline (CHANTIX) 1 MG tablet, Take 1 tablet (1 mg total) by mouth 2 (two) times daily., Disp: 60 tablet, Rfl: 1   Medications ordered in this encounter:  Meds ordered this encounter  Medications   doxycycline (VIBRA-TABS) 100 MG tablet    Sig: Take 1 tablet (100 mg total) by mouth 2 (two) times daily.    Dispense:  20 tablet    Refill:  0    Order Specific  Question:   Supervising Provider    Answer:   Merrilee Jansky [[id number]]   fluticasone (FLOVENT HFA) 110 MCG/ACT inhaler    Sig: Inhale 1 puff into the lungs in the morning and at bedtime.    Dispense:  1 each    Refill:  12    Order Specific Question:   Supervising Provider    Answer:   Merrilee Jansky [[id number]]   albuterol (VENTOLIN HFA) 108 (90 Base) MCG/ACT inhaler    Sig: Inhale 1-2 puffs into the lungs every 6 (six) hours as needed.    Dispense:  18 g    Refill:  0    Order Specific Question:   Supervising Provider    Answer:   Merrilee Jansky [id number]   promethazine-dextromethorphan (PROMETHAZINE-DM) 6.25-15 MG/5ML syrup    Sig: Take 5 mLs by mouth 4 (four) times daily as needed for cough.    Dispense:  118 mL    Refill:  0    Order Specific Question:   Supervising Provider    Answer:   Merrilee Jansky [[id number]]     *If you need refills on other medications prior to your next appointment, please contact your pharmacy*  Follow-Up: Call back or seek an in-person evaluation if the symptoms worsen or if the condition fails to improve as anticipated.  [facility] [phone]  Other Instructions Upper Respiratory Infection, Adult An upper respiratory infection (URI) is a common viral infection of the nose, throat, and upper air passages that lead to the lungs. The most common type of URI is the common cold. URIs usually get better on their own, without medical treatment. What are the causes? A URI is caused by a virus. You may catch a virus by: Breathing in droplets from an infected person's cough or sneeze. Touching something that has been exposed to the virus (is contaminated) and then touching your mouth, nose, or eyes. What increases the risk? You are more likely to get a URI if: You are very young or very old. You have close contact with others, such as at work, school, or a health care facility. You smoke. You have long-term (chronic) heart or  lung disease. You have a weakened disease-fighting system (immune system). You have nasal allergies or asthma. You are experiencing a lot of stress. You have poor nutrition. What are the signs or symptoms? A URI usually involves some of the following symptoms: Runny or stuffy (congested) nose. Cough. Sneezing. Sore throat. Headache. Fatigue. Fever. Loss of appetite. Pain in your forehead, behind your eyes, and over your cheekbones (sinus pain). Muscle aches. Redness or irritation of the eyes. Pressure in the ears or face. How is this diagnosed? This condition may be diagnosed based on your medical history and symptoms, and a physical exam. Your health  care provider may use a swab to take a mucus sample from your nose (nasal swab). This sample can be tested to determine what virus is causing the illness. How is this treated? URIs usually get better on their own within 7-10 days. Medicines cannot cure URIs, but your health care provider may recommend certain medicines to help relieve symptoms, such as: Over-the-counter cold medicines. Cough suppressants. Coughing is a type of defense against infection that helps to clear the respiratory system, so take these medicines only as recommended by your health care provider. Fever-reducing medicines. Follow these instructions at home: Activity Rest as needed. If you have a fever, stay home from work or school until your fever is gone or until your health care provider says your URI cannot spread to other people (is no longer contagious). Your health care provider may have you wear a face mask to prevent your infection from spreading. Relieving symptoms Gargle with a mixture of salt and water 3-4 times a day or as needed. To make salt water, completely dissolve -1 tsp (3-6 g) of salt in 1 cup (237 mL) of warm water. Use a cool-mist humidifier to add moisture to the air. This can help you breathe more easily. Eating and drinking  Drink enough  fluid to keep your urine pale yellow. Eat soups and other clear broths. General instructions  Take over-the-counter and prescription medicines only as told by your health care provider. These include cold medicines, fever reducers, and cough suppressants. Do not use any products that contain nicotine or tobacco. These products include cigarettes, chewing tobacco, and vaping devices, such as e-cigarettes. If you need help quitting, ask your health care provider. Stay away from secondhand smoke. Stay up to date on all immunizations, including the yearly (annual) flu vaccine. Keep all follow-up visits. This is important. How to prevent the spread of infection to others URIs can be contagious. To prevent the infection from spreading: Wash your hands with soap and water for at least 20 seconds. If soap and water are not available, use hand sanitizer. Avoid touching your mouth, face, eyes, or nose. Cough or sneeze into a tissue or your sleeve or elbow instead of into your hand or into the air.  Contact a health care provider if: You are getting worse instead of better. You have a fever or chills. Your mucus is brown or red. You have yellow or brown discharge coming from your nose. You have pain in your face, especially when you bend forward. You have swollen neck glands. You have pain while swallowing. You have white areas in the back of your throat. Get help right away if: You have shortness of breath that gets worse. You have severe or persistent: Headache. Ear pain. Sinus pain. Chest pain. You have chronic lung disease along with any of the following: Making high-pitched whistling sounds when you breathe, most often when you breathe out (wheezing). Prolonged cough (more than 14 days). Coughing up blood. A change in your usual mucus. You have a stiff neck. You have changes in your: Vision. Hearing. Thinking. Mood. These symptoms may be an emergency. Get help right away. Call  911. Do not wait to see if the symptoms will go away. Do not drive yourself to the hospital. Summary An upper respiratory infection (URI) is a common infection of the nose, throat, and upper air passages that lead to the lungs. A URI is caused by a virus. URIs usually get better on their own within 7-10 days. Medicines cannot  cure URIs, but your health care provider may recommend certain medicines to help relieve symptoms. This information is not intended to replace advice given to you by your health care provider. Make sure you discuss any questions you have with your health care provider. Document Revised: 06/09/2021 Document Reviewed: 06/09/2021 Elsevier Patient Education  [employer].    If you have been instructed to have an in-person evaluation today at a local Urgent Care facility, please use the link below. It will take you to a list of all of our available [facility], including address, phone number and hours of operation. Please do not delay care.  [facility]  If you or a family member do not have a primary care provider, use the link below to schedule a visit and establish care. When you choose a [facility] primary care physician or advanced practice provider, you gain a long-term partner in health. Find a Primary Care Provider  Learn more about [facility]'s in-office and virtual care options: [facility]

## 2022-12-26 ENCOUNTER — Other Ambulatory Visit: Payer: Self-pay

## 2023-01-26 ENCOUNTER — Other Ambulatory Visit: Payer: Self-pay

## 2023-03-31 ENCOUNTER — Other Ambulatory Visit: Payer: Self-pay

## 2023-04-07 ENCOUNTER — Other Ambulatory Visit: Payer: Self-pay

## 2023-04-14 ENCOUNTER — Other Ambulatory Visit: Payer: Self-pay

## 2023-06-28 ENCOUNTER — Ambulatory Visit: Payer: Self-pay

## 2023-06-28 NOTE — Telephone Encounter (Signed)
  Swelling in right side of face near jaw line. No appts soon enough        Chief Complaint: Thumb sized lump to right jaw. Symptoms: Above Frequency: 4 months ago Pertinent Negatives: Patient denies pain,fever Disposition: [] ED /[] Urgent Care (no appt availability in office) / [x] Appointment(In office/virtual)/ []  [facility]/ [] Home Care/ [] Refused Recommended Disposition /[]  Mobile Bus/ []  Follow-up with PCP Additional Notes: Pt. Agrees with appointment tomorrow.  Reason for Disposition  [1] Mild face swelling (puffiness) AND [2] persists > 3 days  Answer Assessment - Initial Assessment Questions 1. ONSET: "When did the swelling start?" (e.g., minutes, hours, days)     4 months 2. LOCATION: "What part of the face is swollen?"     Right jaw line 3. SEVERITY: "How swollen is it?"     Lump - Thumb size 4. ITCHING: "Is there any itching?" If Yes, ask: "How much?"   (Scale 1-10; mild, moderate or severe)     No 5. PAIN: "Is the swelling painful to touch?" If Yes, ask: "How painful is it?"   (Scale 1-10; mild, moderate or severe)   - NONE (0): no pain   - MILD (1-3): doesn't interfere with normal activities    - MODERATE (4-7): interferes with normal activities or awakens from sleep    - SEVERE (8-10): excruciating pain, unable to do any normal activities      None 6. FEVER: "Do you have a fever?" If Yes, ask: "What is it, how was it measured, and when did it start?"      No 7. CAUSE: "What do you think is causing the face swelling?"     Old tooth extraction 8. RECURRENT SYMPTOM: "Have you had face swelling before?" If Yes, ask: "When was the last time?" "What happened that time?"     No 9. OTHER SYMPTOMS: "Do you have any other symptoms?" (e.g., toothache, leg swelling)     No 10. PREGNANCY: "Is there any chance you are pregnant?" "When was your last menstrual period?"       N/A  Protocols used: Face Swelling-A-AH

## 2023-06-29 ENCOUNTER — Ambulatory Visit: Payer: Self-pay | Admitting: Physician Assistant

## 2023-07-06 ENCOUNTER — Encounter: Payer: Self-pay | Admitting: Physician Assistant

## 2023-07-06 ENCOUNTER — Ambulatory Visit: Payer: Self-pay | Attending: Physician Assistant | Admitting: Physician Assistant

## 2023-07-06 ENCOUNTER — Other Ambulatory Visit: Payer: Self-pay

## 2023-07-06 VITALS — BP 121/80 | HR 93 | Wt 247.4 lb

## 2023-07-06 DIAGNOSIS — K0889 Other specified disorders of teeth and supporting structures: Secondary | ICD-10-CM

## 2023-07-06 DIAGNOSIS — Z91199 Patient's noncompliance with other medical treatment and regimen due to unspecified reason: Secondary | ICD-10-CM

## 2023-07-06 DIAGNOSIS — E7800 Pure hypercholesterolemia, unspecified: Secondary | ICD-10-CM

## 2023-07-06 DIAGNOSIS — F17200 Nicotine dependence, unspecified, uncomplicated: Secondary | ICD-10-CM

## 2023-07-06 DIAGNOSIS — E1049 Type 1 diabetes mellitus with other diabetic neurological complication: Secondary | ICD-10-CM

## 2023-07-06 DIAGNOSIS — J069 Acute upper respiratory infection, unspecified: Secondary | ICD-10-CM

## 2023-07-06 DIAGNOSIS — I10 Essential (primary) hypertension: Secondary | ICD-10-CM

## 2023-07-06 DIAGNOSIS — E1065 Type 1 diabetes mellitus with hyperglycemia: Secondary | ICD-10-CM

## 2023-07-06 LAB — POCT GLYCOSYLATED HEMOGLOBIN (HGB A1C): HbA1c, POC (controlled diabetic range): 12.3 % — AB (ref 0.0–7.0)

## 2023-07-06 LAB — GLUCOSE, POCT (MANUAL RESULT ENTRY): POC Glucose: 239 mg/dl — AB (ref 70–99)

## 2023-07-06 MED ORDER — INSULIN LISPRO (1 UNIT DIAL) 100 UNIT/ML (KWIKPEN)
10.0000 [IU] | PEN_INJECTOR | Freq: Three times a day (TID) | SUBCUTANEOUS | 4 refills | Status: DC
Start: 2023-07-06 — End: 2023-10-10
  Filled 2023-09-11: qty 12, 40d supply, fill #0

## 2023-07-06 MED ORDER — BASAGLAR KWIKPEN 100 UNIT/ML ~~LOC~~ SOPN
35.0000 [IU] | PEN_INJECTOR | Freq: Two times a day (BID) | SUBCUTANEOUS | 5 refills | Status: DC
  Filled 2023-07-06: qty 15, 21d supply, fill #0
  Filled 2023-12-15 – 2024-01-05 (×2): qty 15, 21d supply, fill #1
  Filled 2024-03-01: qty 15, 21d supply, fill #2
  Filled 2024-04-11: qty 15, 21d supply, fill #3
  Filled 2024-05-09: qty 15, 21d supply, fill #4

## 2023-07-06 MED ORDER — VARENICLINE TARTRATE 1 MG PO TABS
1.0000 mg | ORAL_TABLET | Freq: Two times a day (BID) | ORAL | 1 refills | Status: AC
  Filled 2023-07-06: qty 60, 30d supply, fill #0
  Filled 2023-07-06: qty 56, 28d supply, fill #0
  Filled 2023-11-21: qty 60, 30d supply, fill #0

## 2023-07-06 MED ORDER — GABAPENTIN 300 MG PO CAPS
300.0000 mg | ORAL_CAPSULE | Freq: Two times a day (BID) | ORAL | 3 refills | Status: DC
  Filled 2023-07-06: qty 60, 30d supply, fill #0

## 2023-07-06 MED ORDER — AZITHROMYCIN 250 MG PO TABS
ORAL_TABLET | ORAL | 0 refills | Status: AC
Start: 2023-07-06 — End: 2023-07-11
  Filled 2023-07-06: qty 6, 5d supply, fill #0

## 2023-07-06 MED ORDER — ATORVASTATIN CALCIUM 20 MG PO TABS
20.0000 mg | ORAL_TABLET | Freq: Every day | ORAL | 3 refills | Status: AC
  Filled 2023-07-06: qty 30, 30d supply, fill #0
  Filled 2023-11-21: qty 30, 30d supply, fill #1

## 2023-07-06 MED ORDER — LISINOPRIL 10 MG PO TABS
10.0000 mg | ORAL_TABLET | Freq: Every day | ORAL | 3 refills | Status: AC
  Filled 2023-07-06: qty 30, 30d supply, fill #0
  Filled 2023-11-21: qty 30, 30d supply, fill #1

## 2023-07-06 NOTE — Progress Notes (Signed)
Patient ID: PELE STUMPO, male   DOB: 1990-11-22, 32 y.Christopher.   MRN: [id number]   Christopher Douglas, is a 31 y.Christopher. male  [id number]  [id number]  DOB - 1991-01-23  Chief Complaint  Patient presents with   Mass   Cough   Medication Refill       Subjective:   Christopher Douglas is a 32 y.Christopher. male here today for pain where he had a tooth pulled about 2 years ago.  The pain is intermittent.  No fever.    He says he is compliant with meds but does not appear he has picked up meds in a while.  Eventually he does admit he does not take humalog consistently.   Difficult to obtain straight answers.   Also cough with green mucus for over 1 week.    No problems updated.  ALLERGIES: Allergies  Allergen Reactions   Amoxicillin Hives    Did it involve swelling of the face/tongue/throat, SOB, or low BP? Unknown Did it involve sudden or severe rash/hives, skin peeling, or any reaction on the inside of your mouth or nose? Yes Did you need to seek medical attention at a hospital or doctor's office? Unknown When did it last happen? childhood       If all above answers are "NO", may proceed with cephalosporin use.    Shellfish Allergy Hives    Specifically shrimp    PAST MEDICAL HISTORY: Past Medical History:  Diagnosis Date   Asthma    Diabetes mellitus without complication (HCC)    Hypertension     MEDICATIONS AT HOME: Prior to Admission medications   Medication Sig Start Date End Date Taking? Authorizing Provider  azithromycin (ZITHROMAX) 250 MG tablet Take 2 tablets on day 1, then 1 tablet daily on days 2 through 5 07/06/23 07/11/23 Yes Opha Mcghee, Marzella Schlein, PA-C  albuterol (VENTOLIN HFA) 108 (90 Base) MCG/ACT inhaler Inhale 1-2 puffs into the lungs every 6 (six) hours as needed. 12/16/22   Margaretann Loveless, PA-C  atorvastatin (LIPITOR) 20 MG tablet Take 1 tablet (20 mg total) by mouth daily. 07/06/23   Anders Simmonds, PA-C  Blood Glucose Monitoring Suppl (TRUE METRIX METER) w/Device KIT Use  as instructed three times daily before meals 07/28/22   Hoy Register, MD  cetirizine (ZYRTEC) 10 MG tablet Take 1 tablet (10 mg total) by mouth daily. 11/11/20   Hoy Register, MD  Continuous Blood Gluc Receiver (FREESTYLE LIBRE 2 READER) DEVI Use as instructed three times daily before meals. E10.65 07/28/22   Hoy Register, MD  Continuous Blood Gluc Sensor (FREESTYLE LIBRE 2 SENSOR) MISC Use as instructed three times daily before meals. Change sensors every 14 days. E10.65 07/28/22   Hoy Register, MD  doxycycline (VIBRA-TABS) 100 MG tablet Take 1 tablet (100 mg total) by mouth 2 (two) times daily. 12/16/22   Margaretann Loveless, PA-C  fluticasone (FLONASE) 50 MCG/ACT nasal spray Place 2 sprays into both nostrils daily. 11/11/20   Hoy Register, MD  fluticasone (FLOVENT HFA) 110 MCG/ACT inhaler Inhale 1 puff into the lungs in the morning and at bedtime. 12/16/22   Margaretann Loveless, PA-C  gabapentin (NEURONTIN) 300 MG capsule Take 1 capsule (300 mg total) by mouth 2 (two) times daily. 07/06/23   Anders Simmonds, PA-C  glucose blood (TRUE METRIX BLOOD GLUCOSE TEST) test strip Use as instructed three times daily before meals 07/28/22   Hoy Register, MD  ibuprofen (ADVIL) 800 MG tablet Take 1 tablet (800 mg total) by  mouth 3 (three) times daily. 04/05/19   Eustace Moore, MD  Insulin Glargine [facility]) 100 UNIT/ML Inject 35 Units into the skin 2 (two) times daily. 07/06/23   Anders Simmonds, PA-C  insulin lispro (HUMALOG KWIKPEN) 100 UNIT/ML KwikPen Inject 10 Units into the skin 3 (three) times daily. 07/06/23   Anders Simmonds, PA-C  Insulin Pen Needle 31G X 5 MM MISC Use 4 (four) times daily -  with meals and at bedtime. 06/22/22   Anders Simmonds, PA-C  Insulin Syringe-Needle U-100 (TRUEPLUS INSULIN SYRINGE) 31G X 5/16" 0.3 ML MISC Use to inject Novolog Three times daily 06/22/22   Anders Simmonds, PA-C  lisinopril (ZESTRIL) 10 MG tablet Take 1 tablet (10 mg total) by mouth  daily. 07/06/23   Anders Simmonds, PA-C  montelukast (SINGULAIR) 10 MG tablet Take 1 tablet (10 mg total) by mouth at bedtime. 11/11/20   Hoy Register, MD  omeprazole (PRILOSEC) 20 MG capsule TAKE 1 CAPSULE (20 MG TOTAL) BY MOUTH DAILY. 06/22/22 06/22/23  Anders Simmonds, PA-C  promethazine-dextromethorphan (PROMETHAZINE-DM) 6.25-15 MG/5ML syrup Take 5 mLs by mouth 4 (four) times daily as needed for cough. 12/16/22   Margaretann Loveless, PA-C  TRUEplus Lancets 28G MISC Use as instructed three times daily before meals 07/28/22   Hoy Register, MD  varenicline (CHANTIX) 1 MG tablet Take 1 tablet (1 mg total) by mouth 2 (two) times daily. 07/06/23   Marlow Berenguer, Marzella Schlein, PA-C    ROS: Neg cardiac Neg GI Neg GU Neg MS Neg psych Neg neuro  Objective:   Vitals:   07/06/23 1359  BP: 121/80  Pulse: 93  SpO2: 98%  Weight: 247 lb 6.4 oz (112.2 kg)   Exam General appearance : Awake, alert, not in any distress. Speech Clear. Not toxic looking HEENT: Atraumatic and Normocephalic, tooth #30 missing, no erythema or sign of abscess/infection.  Teeth may be shifting vs phantom pain vs poor dental health.  No submandibular LN Neck: Supple, no JVD. No cervical lymphadenopathy.  Chest: Good air entry bilaterally, CTAB.  No rales/rhonchi/wheezing CVS: S1 S2 regular, no murmurs.  Extremities: B/L Lower Ext shows no edema, both legs are warm to touch Neurology: Awake alert, and oriented X 3, CN II-XII intact, Non focal Skin: No Rash  Data Review Lab Results  Component Value Date   HGBA1C 12.3 (A) 07/06/2023   HGBA1C 11.6 (A) 06/22/2022   HGBA1C 12.3 (H) 12/14/2020    Assessment & Plan   1. Type 1 diabetes mellitus with hyperglycemia (HCC) Questionable compliance-will increase and divide dose for improved efficacy - Glucose (CBG) - HgB A1c - lisinopril (ZESTRIL) 10 MG tablet; Take 1 tablet (10 mg total) by mouth daily.  Dispense: 30 tablet; Refill: 3 - atorvastatin (LIPITOR) 20 MG tablet; Take  1 tablet (20 mg total) by mouth daily.  Dispense: 30 tablet; Refill: 3 - Comprehensive metabolic panel - CBC with Differential/Platelet - Insulin Glargine (BASAGLAR KWIKPEN) 100 UNIT/ML; Inject 35 Units into the skin 2 (two) times daily.  Dispense: 15 mL; Refill: 5 - insulin lispro (HUMALOG KWIKPEN) 100 UNIT/ML KwikPen; Inject 10 Units into the skin 3 (three) times daily.  Dispense: 12 mL; Refill: 4  2. Essential hypertension - lisinopril (ZESTRIL) 10 MG tablet; Take 1 tablet (10 mg total) by mouth daily.  Dispense: 30 tablet; Refill: 3 - Comprehensive metabolic panel  3. Non-compliance Compliance imperative-discussed at lenght  4. Upper respiratory tract infection, unspecified type Will cover for atypicals - azithromycin (ZITHROMAX)  250 MG tablet; Take 2 tablets on day 1, then 1 tablet daily on days 2 through 5  Dispense: 6 tablet; Refill: 0  5. 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  Dispense: 60 capsule; Refill: 3  6. Tobacco dependence - varenicline (CHANTIX) 1 MG tablet; Take 1 tablet (1 mg total) by mouth 2 (two) times daily.  Dispense: 60 tablet; Refill: 1  7. Tooth pain - Ambulatory referral to Dentistry - azithromycin (ZITHROMAX) 250 MG tablet; Take 2 tablets on day 1, then 1 tablet daily on days 2 through 5  Dispense: 6 tablet; Refill: 0  8. Pure hypercholesterolemia - Comprehensive metabolic panel - Lipid panel - CBC with Differential/Platelet Resume atorvastatin   Return for [facility] in 1 month;  PCP([facility]) in 3 months.  The patient was given clear instructions to go to ER or return to medical center if symptoms don't improve, worsen or new problems develop. The patient verbalized understanding. The patient was told to call to get lab results if they haven't heard anything in the next week.      Georgian Co,  PA-C [facility] [place], [place] [phone]   07/06/2023, 2:13 PM

## 2023-07-06 NOTE — Patient Instructions (Signed)
Drink 80 ounces water daily.  Work at a goal of eliminating sugary drinks, candy, desserts, sweets, refined sugars, processed foods, and white carbohydrates.   Check blood sugars fasting and with meals and bring readings to next visit

## 2023-07-07 LAB — COMPREHENSIVE METABOLIC PANEL
ALT: 16 IU/L (ref 0–44)
AST: 18 IU/L (ref 0–40)
Albumin: 4.3 g/dL (ref 4.1–5.1)
Alkaline Phosphatase: 83 IU/L (ref 44–121)
BUN/Creatinine Ratio: 10 (ref 9–20)
BUN: 10 mg/dL (ref 6–20)
Bilirubin Total: 0.4 mg/dL (ref 0.0–1.2)
CO2: 24 mmol/L (ref 20–29)
Calcium: 9.5 mg/dL (ref 8.7–10.2)
Chloride: 98 mmol/L (ref 96–106)
Creatinine, Ser: 1.05 mg/dL (ref 0.76–1.27)
Globulin, Total: 2.9 g/dL (ref 1.5–4.5)
Glucose: 230 mg/dL — ABNORMAL HIGH (ref 70–99)
Potassium: 4.1 mmol/L (ref 3.5–5.2)
Sodium: 138 mmol/L (ref 134–144)
Total Protein: 7.2 g/dL (ref 6.0–8.5)
eGFR: 96 mL/min/{1.73_m2} (ref >59)

## 2023-07-07 LAB — CBC WITH DIFFERENTIAL/PLATELET
Basophils Absolute: 0 10*3/uL (ref 0.0–0.2)
Basos: 0 %
EOS (ABSOLUTE): 0.2 10*3/uL (ref 0.0–0.4)
Eos: 3 %
Hematocrit: 45.3 % (ref 37.5–51.0)
Hemoglobin: 14.9 g/dL (ref 13.0–17.7)
Immature Grans (Abs): 0 10*3/uL (ref 0.0–0.1)
Immature Granulocytes: 0 %
Lymphocytes Absolute: 2.3 10*3/uL (ref 0.7–3.1)
Lymphs: 30 %
MCH: 27.2 pg (ref 26.6–33.0)
MCHC: 32.9 g/dL (ref 31.5–35.7)
MCV: 83 fL (ref 79–97)
Monocytes Absolute: 0.7 10*3/uL (ref 0.1–0.9)
Monocytes: 9 %
Neutrophils Absolute: 4.4 10*3/uL (ref 1.4–7.0)
Neutrophils: 58 %
Platelets: 214 10*3/uL (ref 150–450)
RBC: 5.47 x10E6/uL (ref 4.14–5.80)
RDW: 13.9 % (ref 11.6–15.4)
WBC: 7.7 10*3/uL (ref 3.4–10.8)

## 2023-07-07 LAB — LIPID PANEL
Chol/HDL Ratio: 3.2 ratio (ref 0.0–5.0)
Cholesterol, Total: 218 mg/dL — ABNORMAL HIGH (ref 100–199)
HDL: 68 mg/dL (ref >39)
LDL Chol Calc (NIH): 138 mg/dL — ABNORMAL HIGH (ref 0–99)
Triglycerides: 71 mg/dL (ref 0–149)
VLDL Cholesterol Cal: 12 mg/dL (ref 5–40)

## 2023-07-17 ENCOUNTER — Other Ambulatory Visit: Payer: Self-pay

## 2023-07-26 ENCOUNTER — Encounter: Payer: Self-pay | Admitting: Family Medicine

## 2023-08-08 ENCOUNTER — Ambulatory Visit: Payer: Self-pay | Admitting: Pharmacist

## 2023-08-08 NOTE — Progress Notes (Deleted)
S:    32 y.o. male who presents for type 1 diabetes evaluation, education, and management. Patient reports diabetes was diagnosed in 2014. Patient arrives in *** good spirits and presents without *** any assistance. ***Patient is accompanied by ***.   Patient was referred and last seen by Primary Care Provider, Dr. Alvis Lemmings and Marylene Land, on 07/06/23. Last pharmacist visit was 07/28/2022.   PMH is significant for T1DM, HTN, GERD, asthma, HLD.   At last visit, insulin glargine was changed from 60 units daily to 35 units BID. A1c was 12.3%.   Family/Social History:  -Fhx: DM, HTN, alcohol abuse  -Tobacco: current 0.25 PPD smoker  -Alcohol: none reported   Insurance coverage: none   Current diabetes medications include: insulin glargine (Basaglar Kwikpen) 35 units BID, insulin lispro (Humalog Kwikpen) 10 units TID Current hypertension medications include: lisinopril 10 mg Current hyperlipidemia medications include: atorvastatin 20 mg   Patient reports adherence to taking all medications as prescribed.  *** Patient denies adherence with medications, reports missing *** medications *** times per week, on average.  Do you feel that your medications are working for you? {YES NO:22349} Have you been experiencing any side effects to the medications prescribed? {YES NO:22349} Do you have any problems obtaining medications due to transportation or finances? {YES [id number] Insurance coverage: ***  Patient {Actions; denies-reports:120008} hypoglycemic events.  Reported home fasting blood sugars: ***  Reported 2 hour post-meal/random blood sugars: ***.  Patient {Actions; denies-reports:120008} nocturia (nighttime urination).  Patient {Actions; denies-reports:120008} neuropathy (nerve pain). Patient {Actions; denies-reports:120008} visual changes. Patient {Actions; denies-reports:120008} self foot exams.   Patient reported dietary habits: Eats *** meals/day Breakfast: *** Lunch: *** Dinner:  *** Snacks: *** Drinks: ***  -Endorses using portion control.  -On some days of the week, he will only snack. -Almost always skips lunch  Patient-reported exercise habits:  -None outside of work.  -He is a single dad and takes care of his 97 YO daughter. He is active with her at home.   O:  7 day average blood glucose: ***  Libre3 *** CGM Download today *** on *** % Time CGM is active: ***% Average Glucose: *** mg/dL Glucose Management Indicator: ***  Glucose Variability: ***% (goal <36%) Time in Goal:  - Time in range 70-180: ***% - Time above range: ***% - Time below range: ***% Observed patterns:   Lab Results  Component Value Date   HGBA1C 12.3 (A) 07/06/2023   There were no vitals filed for this visit.  Lipid Panel     Component Value Date/Time   CHOL 218 (H) 07/06/2023 1435   TRIG 71 07/06/2023 1435   HDL 68 07/06/2023 1435   CHOLHDL 3.2 07/06/2023 1435   CHOLHDL 4 09/16/2014 1546   VLDL 24.8 09/16/2014 1546   LDLCALC 138 (H) 07/06/2023 1435    Clinical Atherosclerotic Cardiovascular Disease (ASCVD): {YES/NO:21197} The ASCVD Risk score (Arnett DK, et al., 2019) failed to calculate for the following reasons:   The 2019 ASCVD risk score is only valid for ages 25 to 100   Patient is participating in a Managed Medicaid Plan: No   A/P: Diabetes longstanding, currently uncontrolled. Patient is *** able to verbalize appropriate hypoglycemia management plan. Medication adherence appears ***. Control is suboptimal due to ***. -Basaglar __ units daily  -Humalog ___ units TID before meals -Patient educated on purpose, proper use, and potential adverse effects of ***.  -Extensively discussed pathophysiology of diabetes, recommended lifestyle interventions, dietary effects on blood sugar control.  -Counseled  on s/sx of and management of hypoglycemia.  -Next A1c anticipated 09/2023.   ASCVD risk - primary ***secondary prevention in patient with diabetes. Last LDL is  *** not at goal of <70 *** mg/dL. ASCVD risk factors include *** and 10-year ASCVD risk score of ***. {Desc; low/moderate/high:110033} intensity statin indicated.  -{Meds adjust:18428} ***statin *** mg.   Hypertension longstanding *** currently ***. Blood pressure goal of <130/80 *** mmHg. Medication adherence ***. Blood pressure control is suboptimal due to ***. -{Meds adjust:18428} *** mg.  Written patient instructions provided. Patient verbalized understanding of treatment plan.  Total time in face to face counseling 30 minutes.    Follow-up:  Pharmacist *** PCP clinic visit on 08/14/2023 and 10/10/2023  Roslyn Smiling, PharmD PGY1 Pharmacy Resident 08/08/2023 9:36 AM

## 2023-08-14 ENCOUNTER — Encounter: Payer: Self-pay | Admitting: Family Medicine

## 2023-08-14 ENCOUNTER — Ambulatory Visit: Payer: Self-pay | Attending: Family Medicine | Admitting: Family Medicine

## 2023-08-14 VITALS — BP 124/85 | HR 80 | Ht 75.0 in | Wt 254.2 lb

## 2023-08-14 DIAGNOSIS — F17210 Nicotine dependence, cigarettes, uncomplicated: Secondary | ICD-10-CM

## 2023-08-14 DIAGNOSIS — E1065 Type 1 diabetes mellitus with hyperglycemia: Secondary | ICD-10-CM

## 2023-08-14 DIAGNOSIS — F17200 Nicotine dependence, unspecified, uncomplicated: Secondary | ICD-10-CM

## 2023-08-14 DIAGNOSIS — Z0001 Encounter for general adult medical examination with abnormal findings: Secondary | ICD-10-CM

## 2023-08-14 DIAGNOSIS — Z1159 Encounter for screening for other viral diseases: Secondary | ICD-10-CM

## 2023-08-14 DIAGNOSIS — Z23 Encounter for immunization: Secondary | ICD-10-CM

## 2023-08-14 NOTE — Progress Notes (Signed)
Subjective:  Patient ID: Christopher Douglas, male    DOB: [birth year]  Age: 32 y.o. MRN: [id number]  CC: Annual Exam   HPI Christopher Douglas is a 32 y.o. year old male with a history of ype 1 diabetes mellitus (A1c 10.5), hypertension, hyperlipidemia. He had a visit for chronic disease management last month with the physician assistant.  Interval History: Discussed the use of AI scribe software for clinical note transcription with the patient, who gave verbal consent to proceed.  He reports regular physical activity, primarily biking and walking with his daughter. He has a good intake of fruits and vegetables. He has not seen a dentist or an eye doctor in a few years. He smokes 2-3 cigarettes a day and has been smoking since 2015. He has a prescription for Chantix to help quit smoking but has not picked it up yet. He also reports a cough, which he attributes to smoking. He has diabetes and has not had an eye exam in a few years.       Past Medical History:  Diagnosis Date   Asthma    Diabetes mellitus without complication (HCC)    Hypertension     Past Surgical History:  Procedure Laterality Date   FOOT SURGERY      Family History  Problem Relation Age of Onset   Diabetes Mellitus II Father    Hypertension Father    Alcohol abuse Sister    Alcohol abuse Brother    Diabetes Maternal Grandmother    Diabetes Maternal Grandfather    Hypertension Maternal Grandfather    Hypertension Mother     Social History   Socioeconomic History   Marital status: Single    Spouse name: Not on file   Number of children: Not on file   Years of education: Not on file   Highest education level: Not on file  Occupational History   Not on file  Tobacco Use   Smoking status: Every Day    Current packs/day: 0.25    Average packs/day: 0.3 packs/day for 5.0 years (1.3 ttl pk-yrs)    Types: Cigarettes   Smokeless tobacco: Never  Vaping Use   Vaping status: Never Used  Substance and Sexual  Activity   Alcohol use: No   Drug use: No   Sexual activity: Yes    Birth control/protection: Condom  Other Topics Concern   Not on file  Social History Narrative   Not on file   Social Determinants of Health   Financial Resource Strain: Not on file  Food Insecurity: Not on file  Transportation Needs: Not on file  Physical Activity: Not on file  Stress: Not on file  Social Connections: Not on file    Allergies  Allergen Reactions   Amoxicillin Hives    Did it involve swelling of the face/tongue/throat, SOB, or low BP? Unknown Did it involve sudden or severe rash/hives, skin peeling, or any reaction on the inside of your mouth or nose? Yes Did you need to seek medical attention at a hospital or doctor's office? Unknown When did it last happen? childhood       If all above answers are "NO", may proceed with cephalosporin use.    Shellfish Allergy Hives    Specifically shrimp    Outpatient Medications Prior to Visit  Medication Sig Dispense Refill   albuterol (VENTOLIN HFA) 108 (90 Base) MCG/ACT inhaler Inhale 1-2 puffs into the lungs every 6 (six) hours as needed. 6.7 g 0  atorvastatin (LIPITOR) 20 MG tablet Take 1 tablet (20 mg total) by mouth daily. 30 tablet 3   Blood Glucose Monitoring Suppl (TRUE METRIX METER) w/Device KIT Use as instructed three times daily before meals 1 kit 0   cetirizine (ZYRTEC) 10 MG tablet Take 1 tablet (10 mg total) by mouth daily. 30 tablet 1   Continuous Blood Gluc Receiver (FREESTYLE LIBRE 2 READER) DEVI Use as instructed three times daily before meals. E10.65 1 each 0   Continuous Blood Gluc Sensor (FREESTYLE LIBRE 2 SENSOR) MISC Use as instructed three times daily before meals. Change sensors every 14 days. E10.65 2 each 3   doxycycline (VIBRA-TABS) 100 MG tablet Take 1 tablet (100 mg total) by mouth 2 (two) times daily. 20 tablet 0   fluticasone (FLONASE) 50 MCG/ACT nasal spray Place 2 sprays into both nostrils daily. 16 g 6   fluticasone  (FLOVENT HFA) 110 MCG/ACT inhaler Inhale 1 puff into the lungs in the morning and at bedtime. 1 each 12   gabapentin (NEURONTIN) 300 MG capsule Take 1 capsule (300 mg total) by mouth 2 (two) times daily. 60 capsule 3   glucose blood (TRUE METRIX BLOOD GLUCOSE TEST) test strip Use as instructed three times daily before meals 100 each 3   ibuprofen (ADVIL) 800 MG tablet Take 1 tablet (800 mg total) by mouth 3 (three) times daily. 21 tablet 0   Insulin Glargine (BASAGLAR KWIKPEN) 100 UNIT/ML Inject 35 Units into the skin 2 (two) times daily. 15 mL 5   insulin lispro (HUMALOG KWIKPEN) 100 UNIT/ML KwikPen Inject 10 Units into the skin 3 (three) times daily. 12 mL 4   Insulin Pen Needle 31G X 5 MM MISC Use 4 (four) times daily -  with meals and at bedtime. 100 each 0   Insulin Syringe-Needle U-100 (TRUEPLUS INSULIN SYRINGE) 31G X 5/16" 0.3 ML MISC Use to inject Novolog Three times daily 100 each 2   lisinopril (ZESTRIL) 10 MG tablet Take 1 tablet (10 mg total) by mouth daily. 30 tablet 3   montelukast (SINGULAIR) 10 MG tablet Take 1 tablet (10 mg total) by mouth at bedtime. 30 tablet 1   promethazine-dextromethorphan (PROMETHAZINE-DM) 6.25-15 MG/5ML syrup Take 5 mLs by mouth 4 (four) times daily as needed for cough. 118 mL 0   TRUEplus Lancets 28G MISC Use as instructed three times daily before meals 100 each 3   varenicline (CHANTIX) 1 MG tablet Take 1 tablet (1 mg total) by mouth 2 (two) times daily. 60 tablet 1   omeprazole (PRILOSEC) 20 MG capsule TAKE 1 CAPSULE (20 MG TOTAL) BY MOUTH DAILY. 30 capsule 3   No facility-administered medications prior to visit.     ROS Review of Systems  Constitutional:  Negative for activity change and appetite change.  HENT:  Negative for sinus pressure and sore throat.   Eyes:  Negative for visual disturbance.  Respiratory:  Negative for cough, chest tightness and shortness of breath.   Cardiovascular:  Negative for chest pain and leg swelling.   Gastrointestinal:  Negative for abdominal distention, abdominal pain, constipation and diarrhea.  Endocrine: Negative.   Genitourinary:  Negative for dysuria.  Musculoskeletal:  Negative for joint swelling and myalgias.  Skin:  Negative for rash.  Allergic/Immunologic: Negative.   Neurological:  Negative for weakness, light-headedness and numbness.  Psychiatric/Behavioral:  Negative for dysphoric mood and suicidal ideas.     Objective:  BP 124/85   Pulse 80   Ht 6\' 3"  (1.905 m)  Wt 254 lb 3.2 oz (115.3 kg)   SpO2 98%   BMI 31.77 kg/m      08/14/2023    2:59 PM 07/06/2023    1:59 PM 06/22/2022    8:47 AM  BP/Weight  Systolic BP 124 121 131  Diastolic BP 85 80 85  Wt. (Lbs) 254.2 247.4 248  BMI 31.77 kg/m2 30.92 kg/m2 31 kg/m2      Physical Exam Constitutional:      Appearance: He is well-developed.  HENT:     Head: Normocephalic and atraumatic.     Right Ear: External ear normal.     Left Ear: External ear normal.  Eyes:     Conjunctiva/sclera: Conjunctivae normal.     Pupils: Pupils are equal, round, and reactive to light.  Neck:     Trachea: No tracheal deviation.  Cardiovascular:     Rate and Rhythm: Normal rate and regular rhythm.     Heart sounds: Normal heart sounds. No murmur heard. Pulmonary:     Effort: Pulmonary effort is normal. No respiratory distress.     Breath sounds: Normal breath sounds. No wheezing or rales.  Chest:     Chest wall: No tenderness.  Abdominal:     General: Bowel sounds are normal. There is no distension.     Palpations: Abdomen is soft. There is no mass.     Tenderness: There is no abdominal tenderness.  Musculoskeletal:        General: No tenderness. Normal range of motion.     Cervical back: Normal range of motion and neck supple.     Right lower leg: No edema.     Left lower leg: No edema.  Skin:    General: Skin is warm and dry.  Neurological:     Mental Status: He is alert and oriented to person, place, and time.   Psychiatric:        Mood and Affect: Mood normal.    Diabetic Foot Exam - Simple   Simple Foot Form Diabetic Foot exam was performed with the following findings: Yes 08/14/2023  3:27 PM  Visual Inspection See comments: Yes Sensation Testing Intact to touch and monofilament testing bilaterally: Yes Pulse Check Posterior Tibialis and Dorsalis pulse intact bilaterally: Yes Comments Dry skin on heel of right foot.  No calluses or ulcerations        Latest Ref Rng & Units 07/06/2023    2:35 PM 06/22/2022    9:04 AM 12/14/2020   12:11 PM  CMP  Glucose 70 - 99 mg/dL 829  562  130   BUN 6 - 20 mg/dL 10  8  7    Creatinine 0.76 - 1.27 mg/dL 8.65  7.84  6.96   Sodium 134 - 144 mmol/L 138  140  141   Potassium 3.5 - 5.2 mmol/L 4.1  4.4  4.5   Chloride 96 - 106 mmol/L 98  103  103   CO2 20 - 29 mmol/L 24  22  25    Calcium 8.7 - 10.2 mg/dL 9.5  9.0  9.0   Total Protein 6.0 - 8.5 g/dL 7.2  6.3  7.5   Total Bilirubin 0.0 - 1.2 mg/dL 0.4  0.3  0.4   Alkaline Phos 44 - 121 IU/L 83  70  90   AST 0 - 40 IU/L 18  12  47   ALT 0 - 44 IU/L 16  11  29      Lipid Panel     Component  Value Date/Time   CHOL 218 (H) 07/06/2023 1435   TRIG 71 07/06/2023 1435   HDL 68 07/06/2023 1435   CHOLHDL 3.2 07/06/2023 1435   CHOLHDL 4 09/16/2014 1546   VLDL 24.8 09/16/2014 1546   LDLCALC 138 (H) 07/06/2023 1435    CBC    Component Value Date/Time   WBC 7.7 07/06/2023 1435   WBC 6.3 07/03/2017 1538   RBC 5.47 07/06/2023 1435   RBC 5.20 07/03/2017 1538   HGB 14.9 07/06/2023 1435   HCT 45.3 07/06/2023 1435   PLT 214 07/06/2023 1435   MCV 83 07/06/2023 1435   MCH 27.2 07/06/2023 1435   MCH 28.5 07/03/2017 1538   MCHC 32.9 07/06/2023 1435   MCHC 34.6 07/03/2017 1538   RDW 13.9 07/06/2023 1435   LYMPHSABS 2.3 07/06/2023 1435   MONOABS 0.8 04/13/2017 0526   EOSABS 0.2 07/06/2023 1435   BASOSABS 0.0 07/06/2023 1435    Lab Results  Component Value Date   HGBA1C 12.3 (A) 07/06/2023     Assessment & Plan:     Annual physical exam with abnormal findings Counseled on 150 minutes of exercise per week, healthy eating (including decreased daily intake of saturated fats, cholesterol, added sugars, sodium), routine healthcare maintenance.  Tobacco Use Disorder Patient smokes 2-3 cigarettes daily since 2015. Patient has a prescription for Chantix but has not picked it up. Discussed the risks of continued smoking and the benefits of quitting. -Pick up and start Chantix as prescribed. -He plans to consider using chew sticks as a behavioral replacement for smoking.  Type 1 diabetes mellitus  Addressed last month at his chronic disease management visit No acute issues. Discussed the importance of annual foot exams, eye exams, and urine tests to monitor for complications. -Perform foot exam today. -Order urine test to check kidney function. -Refer to ophthalmology for annual eye exam.  General Health Maintenance -Administer influenza vaccine today. -Order Hepatitis C screening. -Schedule follow-up visit in 3 months for diabetes management.          No orders of the defined types were placed in this encounter.   Follow-up: Return in about 3 months (around 11/13/2023) for Chronic medical conditions.       Hoy Register, MD, FAAFP. [facility] [place], [place] [phone]   08/14/2023, 3:26 PM

## 2023-08-14 NOTE — Patient Instructions (Signed)
Health Maintenance, Male Adopting a healthy lifestyle and getting preventive care are important in promoting health and wellness. Ask your health care provider about: The right schedule for you to have regular tests and exams. Things you can do on your own to prevent diseases and keep yourself healthy. What should I know about diet, weight, and exercise? Eat a healthy diet  Eat a diet that includes plenty of vegetables, fruits, low-fat dairy products, and lean protein. Do not eat a lot of foods that are high in solid fats, added sugars, or sodium. Maintain a healthy weight Body mass index (BMI) is a measurement that can be used to identify possible weight problems. It estimates body fat based on height and weight. Your health care provider can help determine your BMI and help you achieve or maintain a healthy weight. Get regular exercise Get regular exercise. This is one of the most important things you can do for your health. Most adults should: Exercise for at least 150 minutes each week. The exercise should increase your heart rate and make you sweat (moderate-intensity exercise). Do strengthening exercises at least twice a week. This is in addition to the moderate-intensity exercise. Spend less time sitting. Even light physical activity can be beneficial. Watch cholesterol and blood lipids Have your blood tested for lipids and cholesterol at 34 years of age, then have this test every 5 years. You may need to have your cholesterol levels checked more often if: Your lipid or cholesterol levels are high. You are older than 34 years of age. You are at high risk for heart disease. What should I know about cancer screening? Many types of cancers can be detected early and may often be prevented. Depending on your health history and family history, you may need to have cancer screening at various ages. This may include screening for: Colorectal cancer. Prostate cancer. Skin cancer. Lung  cancer. What should I know about heart disease, diabetes, and high blood pressure? Blood pressure and heart disease High blood pressure causes heart disease and increases the risk of stroke. This is more likely to develop in people who have high blood pressure readings or are overweight. Talk with your health care provider about your target blood pressure readings. Have your blood pressure checked: Every 3-5 years if you are 18-39 years of age. Every year if you are 40 years old or older. If you are between the ages of 65 and 75 and are a current or former smoker, ask your health care provider if you should have a one-time screening for abdominal aortic aneurysm (AAA). Diabetes Have regular diabetes screenings. This checks your fasting blood sugar level. Have the screening done: Once every three years after age 45 if you are at a normal weight and have a low risk for diabetes. More often and at a younger age if you are overweight or have a high risk for diabetes. What should I know about preventing infection? Hepatitis B If you have a higher risk for hepatitis B, you should be screened for this virus. Talk with your health care provider to find out if you are at risk for hepatitis B infection. Hepatitis C Blood testing is recommended for: Everyone born from 1945 through 1965. Anyone with known risk factors for hepatitis C. Sexually transmitted infections (STIs) You should be screened each year for STIs, including gonorrhea and chlamydia, if: You are sexually active and are younger than 34 years of age. You are older than 34 years of age and your   health care provider tells you that you are at risk for this type of infection. Your sexual activity has changed since you were last screened, and you are at increased risk for chlamydia or gonorrhea. Ask your health care provider if you are at risk. Ask your health care provider about whether you are at high risk for HIV. Your health care provider  may recommend a prescription medicine to help prevent HIV infection. If you choose to take medicine to prevent HIV, you should first get tested for HIV. You should then be tested every 3 months for as long as you are taking the medicine. Follow these instructions at home: Alcohol use Do not drink alcohol if your health care provider tells you not to drink. If you drink alcohol: Limit how much you have to 0-2 drinks a day. Know how much alcohol is in your drink. In the [place] one drink equals one 12 oz bottle of beer (355 mL), one 5 oz glass of wine (148 mL), or one 1 oz glass of hard liquor (44 mL). Lifestyle Do not use any products that contain nicotine or tobacco. These products include cigarettes, chewing tobacco, and vaping devices, such as e-cigarettes. If you need help quitting, ask your health care provider. Do not use street drugs. Do not share needles. Ask your health care provider for help if you need support or information about quitting drugs. General instructions Schedule regular health, dental, and eye exams. Stay current with your vaccines. Tell your health care provider if: You often feel depressed. You have ever been abused or do not feel safe at home. Summary Adopting a healthy lifestyle and getting preventive care are important in promoting health and wellness. Follow your health care provider's instructions about healthy diet, exercising, and getting tested or screened for diseases. Follow your health care provider's instructions on monitoring your cholesterol and blood pressure. This information is not intended to replace advice given to you by your health care provider. Make sure you discuss any questions you have with your health care provider. Document Revised: 03/29/2021 Document Reviewed: 03/29/2021 Elsevier Patient Education  2024 [employer]  

## 2023-08-15 LAB — MICROALBUMIN / CREATININE URINE RATIO
Creatinine, Urine: 256.7 mg/dL
Microalb/Creat Ratio: 7 mg/g creat (ref 0–29)
Microalbumin, Urine: 16.8 ug/mL

## 2023-08-15 LAB — HCV INTERPRETATION

## 2023-08-15 LAB — HCV AB W REFLEX TO QUANT PCR: HCV Ab: NONREACTIVE

## 2023-09-11 ENCOUNTER — Other Ambulatory Visit: Payer: Self-pay

## 2023-09-12 ENCOUNTER — Other Ambulatory Visit: Payer: Self-pay

## 2023-09-26 ENCOUNTER — Encounter: Payer: Self-pay | Admitting: Physician Assistant

## 2023-09-26 ENCOUNTER — Other Ambulatory Visit: Payer: Self-pay

## 2023-09-26 ENCOUNTER — Ambulatory Visit: Payer: Self-pay | Admitting: Physician Assistant

## 2023-09-26 VITALS — BP 161/95 | HR 83 | Temp 98.7°F | Ht 75.0 in | Wt 256.0 lb

## 2023-09-26 DIAGNOSIS — J0110 Acute frontal sinusitis, unspecified: Secondary | ICD-10-CM

## 2023-09-26 DIAGNOSIS — I10 Essential (primary) hypertension: Secondary | ICD-10-CM

## 2023-09-26 MED ORDER — AZITHROMYCIN 250 MG PO TABS
ORAL_TABLET | ORAL | 0 refills | Status: AC
Start: 2023-09-26 — End: 2023-10-01
  Filled 2023-09-26: qty 6, 5d supply, fill #0

## 2023-09-26 MED ORDER — FLUTICASONE PROPIONATE 50 MCG/ACT NA SUSP
2.0000 | Freq: Every day | NASAL | 6 refills | Status: DC
Start: 2023-09-26 — End: 2024-01-15
  Filled 2023-09-26: qty 16, 30d supply, fill #0

## 2023-09-26 NOTE — Patient Instructions (Signed)
You are going to take azithromycin as directed and use flonase as needed.  I encourage you to stay well-hydrated, and get plenty of rest.  Your blood pressure is elevated today, I encourage you to take your lisinopril on a daily basis, and check your blood pressure on a daily basis.  Please keep a written log and have available for all office visits.  If your blood pressure remains elevated, please feel free to return to the mobile unit or follow-up with your primary care provider.  Roney Jaffe, PA-C Physician Assistant [facility] [email or web address]   Sinus Infection, Adult A sinus infection, also called sinusitis, is inflammation of your sinuses. Sinuses are hollow spaces in the bones around your face. Your sinuses are located: Around your eyes. In the middle of your forehead. Behind your nose. In your cheekbones. Mucus normally drains out of your sinuses. When your nasal tissues become inflamed or swollen, mucus can become trapped or blocked. This allows bacteria, viruses, and fungi to grow, which leads to infection. Most infections of the sinuses are caused by a virus. A sinus infection can develop quickly. It can last for up to 4 weeks (acute) or for more than 12 weeks (chronic). A sinus infection often develops after a cold. What are the causes? This condition is caused by anything that creates swelling in the sinuses or stops mucus from draining. This includes: Allergies. Asthma. Infection from bacteria or viruses. Deformities or blockages in your nose or sinuses. Abnormal growths in the nose (nasal polyps). Pollutants, such as chemicals or irritants in the air. Infection from fungi. This is rare. What increases the risk? You are more likely to develop this condition if you: Have a weak body defense system (immune system). Do a lot of swimming or diving. Overuse nasal sprays. Smoke. What are the signs or  symptoms? The main symptoms of this condition are pain and a feeling of pressure around the affected sinuses. Other symptoms include: Stuffy nose or congestion that makes it difficult to breathe through your nose. Thick yellow or greenish drainage from your nose. Tenderness, swelling, and warmth over the affected sinuses. A cough that may get worse at night. Decreased sense of smell and taste. Extra mucus that collects in the throat or the back of the nose (postnasal drip) causing a sore throat or bad breath. Tiredness (fatigue). Fever. How is this diagnosed? This condition is diagnosed based on: Your symptoms. Your medical history. A physical exam. Tests to find out if your condition is acute or chronic. This may include: Checking your nose for nasal polyps. Viewing your sinuses using a device that has a light (endoscope). Testing for allergies or bacteria. Imaging tests, such as an MRI or CT scan. In rare cases, a bone biopsy may be done to rule out more serious types of fungal sinus disease. How is this treated? Treatment for a sinus infection depends on the cause and whether your condition is chronic or acute. If caused by a virus, your symptoms should go away on their own within 10 days. You may be given medicines to relieve symptoms. They include: Medicines that shrink swollen nasal passages (decongestants). A spray that eases inflammation of the nostrils (topical intranasal corticosteroids). Rinses that help get rid of thick mucus in your nose (nasal saline washes). Medicines that treat allergies (antihistamines). Over-the-counter pain relievers. If caused by bacteria, your health care provider may recommend waiting to see if your symptoms improve. Most bacterial infections will get better without antibiotic  medicine. You may be given antibiotics if you have: A severe infection. A weak immune system. If caused by narrow nasal passages or nasal polyps, surgery may be  needed. Follow these instructions at home: Medicines Take, use, or apply over-the-counter and prescription medicines only as told by your health care provider. These may include nasal sprays. If you were prescribed an antibiotic medicine, take it as told by your health care provider. Do not stop taking the antibiotic even if you start to feel better. Hydrate and humidify  Drink enough fluid to keep your urine pale yellow. Staying hydrated will help to thin your mucus. Use a cool mist humidifier to keep the humidity level in your home above 50%. Inhale steam for 10-15 minutes, 3-4 times a day, or as told by your health care provider. You can do this in the bathroom while a hot shower is running. Limit your exposure to cool or dry air. Rest Rest as much as possible. Sleep with your head raised (elevated). Make sure you get enough sleep each night. General instructions  Apply a warm, moist washcloth to your face 3-4 times a day or as told by your health care provider. This will help with discomfort. Use nasal saline washes as often as told by your health care provider. Wash your hands often with soap and water to reduce your exposure to germs. If soap and water are not available, use hand sanitizer. Do not smoke. Avoid being around people who are smoking (secondhand smoke). Keep all follow-up visits. This is important. Contact a health care provider if: You have a fever. Your symptoms get worse. Your symptoms do not improve within 10 days. Get help right away if: You have a severe headache. You have persistent vomiting. You have severe pain or swelling around your face or eyes. You have vision problems. You develop confusion. Your neck is stiff. You have trouble breathing. These symptoms may be an emergency. Get help right away. Call 911. Do not wait to see if the symptoms will go away. Do not drive yourself to the hospital. Summary A sinus infection is soreness and inflammation of  your sinuses. Sinuses are hollow spaces in the bones around your face. This condition is caused by nasal tissues that become inflamed or swollen. The swelling traps or blocks the flow of mucus. This allows bacteria, viruses, and fungi to grow, which leads to infection. If you were prescribed an antibiotic medicine, take it as told by your health care provider. Do not stop taking the antibiotic even if you start to feel better. Keep all follow-up visits. This is important. This information is not intended to replace advice given to you by your health care provider. Make sure you discuss any questions you have with your health care provider. Document Revised: 10/12/2021 Document Reviewed: 10/12/2021 Elsevier Patient Education  2024 [employer].

## 2023-09-26 NOTE — Progress Notes (Unsigned)
Established Patient Office Visit  Subjective   Patient ID: Christopher Douglas, male    DOB: [birth year]  Age: 32 y.o. MRN: [id number]  Chief Complaint  Patient presents with   Sinusitis    Symptoms started last Thursday, Took ear ache OTC     States that he has been experiencing pain and in "underwater feeling" in his right ear for the past 6 days.  States that he has also been experiencing sinus pressure, greenish-yellow nasal discharge.  Denies fever or chills.  Eating and drinking okay  States that daughter has had similar symptoms  States that he tried an over-the-counter eardrop for ear pain without relief.  States that he does take an over-the-counter allergy medication on a daily basis.  States that he does not check his blood pressure at home, states that he has not been taking the lisinopril, states he is concerned about side effects from the medication.    Past Medical History:  Diagnosis Date   Asthma    Diabetes mellitus without complication (HCC)    Hypertension    Social History   Socioeconomic History   Marital status: Single    Spouse name: Not on file   Number of children: Not on file   Years of education: Not on file   Highest education level: Not on file  Occupational History   Not on file  Tobacco Use   Smoking status: Every Day    Current packs/day: 0.25    Average packs/day: 0.3 packs/day for 5.0 years (1.3 ttl pk-yrs)    Types: Cigarettes   Smokeless tobacco: Never  Vaping Use   Vaping status: Never Used  Substance and Sexual Activity   Alcohol use: No   Drug use: No   Sexual activity: Yes    Birth control/protection: Condom  Other Topics Concern   Not on file  Social History Narrative   Not on file   Social Determinants of Health   Financial Resource Strain: Not on file  Food Insecurity: Not on file  Transportation Needs: Not on file  Physical Activity: Not on file  Stress: Not on file  Social Connections: Not on file  Intimate  Partner Violence: Not on file   Family History  Problem Relation Age of Onset   Diabetes Mellitus II Father    Hypertension Father    Alcohol abuse Sister    Alcohol abuse Brother    Diabetes Maternal Grandmother    Diabetes Maternal Grandfather    Hypertension Maternal Grandfather    Hypertension Mother    Allergies  Allergen Reactions   Amoxicillin Hives    Did it involve swelling of the face/tongue/throat, SOB, or low BP? Unknown Did it involve sudden or severe rash/hives, skin peeling, or any reaction on the inside of your mouth or nose? Yes Did you need to seek medical attention at a hospital or doctor's office? Unknown When did it last happen? childhood       If all above answers are "NO", may proceed with cephalosporin use.    Shellfish Allergy Hives    Specifically shrimp    Review of Systems  Constitutional:  Negative for chills and fever.  HENT:  Positive for congestion, ear pain and sinus pain. Negative for sore throat.   Eyes: Negative.   Respiratory:  Negative for cough, sputum production and shortness of breath.   Cardiovascular:  Negative for chest pain.  Gastrointestinal:  Negative for abdominal pain, nausea and vomiting.  Genitourinary: Negative.  Musculoskeletal:  Negative for myalgias.  Skin: Negative.   Neurological: Negative.   Endo/Heme/Allergies: Negative.   Psychiatric/Behavioral: Negative.        Objective:     BP (!) 161/95 (BP Location: Left Arm, Patient Position: Sitting, Cuff Size: Large)   Pulse 83   Temp 98.7 F (37.1 C)   Ht 6\' 3"  (1.905 m)   Wt 256 lb (116.1 kg)   SpO2 99%   BMI 32.00 kg/m  BP Readings from Last 3 Encounters:  09/26/23 (!) 161/95  08/14/23 124/85  07/06/23 121/80   Wt Readings from Last 3 Encounters:  09/26/23 256 lb (116.1 kg)  08/14/23 254 lb 3.2 oz (115.3 kg)  07/06/23 247 lb 6.4 oz (112.2 kg)    Physical Exam Vitals and nursing note reviewed.  Constitutional:      Appearance: Normal appearance.   HENT:     Head: Normocephalic.     Jaw: There is normal jaw occlusion.     Salivary Glands: Right salivary gland is not diffusely enlarged or tender. Left salivary gland is not diffusely enlarged or tender.     Right Ear: Tenderness present. Tympanic membrane is erythematous.     Left Ear: Tympanic membrane, ear canal and external ear normal.     Nose:     Right Turbinates: Enlarged and swollen.     Left Turbinates: Enlarged and swollen.     Right Sinus: Maxillary sinus tenderness and frontal sinus tenderness present.     Left Sinus: Maxillary sinus tenderness and frontal sinus tenderness present.     Mouth/Throat:     Mouth: Mucous membranes are moist.     Pharynx: Oropharynx is clear.  Eyes:     Extraocular Movements: Extraocular movements intact.     Conjunctiva/sclera: Conjunctivae normal.     Pupils: Pupils are equal, round, and reactive to light.  Cardiovascular:     Rate and Rhythm: Normal rate.  Pulmonary:     Effort: Pulmonary effort is normal.     Breath sounds: Normal breath sounds.  Musculoskeletal:        General: Normal range of motion.     Cervical back: Normal range of motion and neck supple.  Skin:    General: Skin is warm and dry.  Neurological:     General: No focal deficit present.     Mental Status: He is alert and oriented to person, place, and time.  Psychiatric:        Mood and Affect: Mood normal.        Behavior: Behavior normal.        Thought Content: Thought content normal.        Judgment: Judgment normal.        Assessment & Plan:   Problem List Items Addressed This Visit   None  1. Acute non-recurrent frontal sinusitis Trial azithromycin, Flonase.  Patient education given on supportive care.  Red flags given for prompt reevaluation. - azithromycin (ZITHROMAX) 250 MG tablet; Take 2 tablets (500 mg total) by mouth daily for 1 day, THEN 1 tablet (250 mg total) daily for 4 days.  Dispense: 6 tablet; Refill: 0 - fluticasone (FLONASE) 50  MCG/ACT nasal spray; Place 2 sprays into both nostrils daily.  Dispense: 16 g; Refill: 6  2. Primary hypertension Patient encouraged to do trial lisinopril.  Patient encouraged to check blood pressure at home on a daily basis, keep a written log and have available for all office visits.  Patient encouraged to follow-up with  the mobile unit or primary care provider if blood pressure readings remain elevated.  Patient understands and agrees.  3. Elevated blood pressure reading in office with diagnosis of hypertension    I have reviewed the patient's medical history (PMH, PSH, Social History, Family History, Medications, and allergies) , and have been updated if relevant. I spent 30 minutes reviewing chart and  face to face time with patient.     No follow-ups on file.    Kasandra Knudsen Mayers, PA-C

## 2023-09-27 ENCOUNTER — Encounter: Payer: Self-pay | Admitting: Physician Assistant

## 2023-10-02 ENCOUNTER — Other Ambulatory Visit: Payer: Self-pay

## 2023-10-02 ENCOUNTER — Ambulatory Visit: Payer: Self-pay | Admitting: Physician Assistant

## 2023-10-02 ENCOUNTER — Encounter: Payer: Self-pay | Admitting: Physician Assistant

## 2023-10-02 VITALS — BP 160/96 | HR 70 | Ht 75.0 in | Wt 256.0 lb

## 2023-10-02 DIAGNOSIS — J0110 Acute frontal sinusitis, unspecified: Secondary | ICD-10-CM

## 2023-10-02 DIAGNOSIS — I10 Essential (primary) hypertension: Secondary | ICD-10-CM

## 2023-10-02 MED ORDER — AMOXICILLIN-POT CLAVULANATE 875-125 MG PO TABS
1.0000 | ORAL_TABLET | Freq: Two times a day (BID) | ORAL | 0 refills | Status: DC
Start: 2023-10-02 — End: 2024-03-01
  Filled 2023-10-02: qty 20, 10d supply, fill #0

## 2023-10-02 NOTE — Patient Instructions (Signed)
You are going to take Augmentin twice a day for 10 days.  Continue using the Flonase and your daily allergy medication as well.  Your blood pressure is elevated again today, continue taking the lisinopril on a daily basis, and I do encourage you to start checking your blood pressure at home, keeping a written log and having it available for all office visits.  Roney Jaffe, PA-C Physician Assistant [facility] [email or web address]   Amoxicillin; Clavulanic Acid Chewable Tablets What is this medication? AMOXICILLIN; CLAVULANIC ACID (a mox i SIL in; KLAV yoo lan ic AS id) treats infections caused by bacteria. It belongs to a group of medications called penicillin antibiotics. It will not treat colds, the flu, or infections caused by viruses. This medicine may be used for other purposes; ask your health care provider or pharmacist if you have questions. COMMON BRAND NAME(S): Augmentin What should I tell my care team before I take this medication? They need to know if you have any of these conditions: Kidney disease Liver disease Mononucleosis Phenylketonuria Stomach or intestine problems such as colitis An unusual or allergic reaction to amoxicillin, clavulanic acid, other medications, foods, dyes, or preservatives Pregnant or trying to get pregnant Breastfeeding How should I use this medication? Take this medication by mouth. Take it as directed on the prescription label at the same time every day. Chew or crush it completely before swallowing. Do not swallow tablets whole. You can take it with or without food. If it upsets your stomach, take it with food. Take all of this medication unless your care team tells you to stop it early. Keep taking it even if you think you are better. Talk to your care team about the use of this medication in children. While it may be prescribed for selected conditions, precautions do apply. Overdosage:  If you think you have taken too much of this medicine contact a poison control center or emergency room at once. NOTE: This medicine is only for you. Do not share this medicine with others. What if I miss a dose? If you miss a dose, take it as soon as you can. If it is almost time for your next dose, take only that dose. Do not take double or extra doses. What may interact with this medication? Allopurinol Anticoagulants Estrogen or progestin hormones Methotrexate Probenecid This list may not describe all possible interactions. Give your health care provider a list of all the medicines, herbs, non-prescription drugs, or dietary supplements you use. Also tell them if you smoke, drink alcohol, or use illegal drugs. Some items may interact with your medicine. What should I watch for while using this medication? Tell your care team if your symptoms do not start to get better or if they get worse. This medication may cause serious skin reactions. They can happen weeks to months after starting the medication. Contact your care team right away if you notice fevers or flu-like symptoms with a rash. The rash may be red or purple and then turn into blisters or peeling of the skin. You may also notice a red rash with swelling of the face, lips, or lymph nodes in your neck or under your arms. This product may contain aspartame, which is a source of phenylalanine. If you have phenylketonuria (PKU), contact your care team for advice. Do not treat diarrhea with over the counter products. Contact your care team if you have diarrhea that lasts more than 2 days or if it is severe and watery.  If you have diabetes, you may get a false-positive result for sugar in your urine. Check with your care team. Estrogen and progestin hormones may not work as well while you are taking this medication. A barrier contraceptive, such as a condom or diaphragm, is recommended if you are using these hormones for contraception. Talk to  your care team about effective forms of contraception. What side effects may I notice from receiving this medication? Side effects that you should report to your care team as soon as possible: Allergic reactions--skin rash, itching, hives, swelling of the face, lips, tongue, or throat Liver injury--right upper belly pain, loss of appetite, nausea, light-colored stool, dark yellow or brown urine, yellowing skin or eyes, unusual weakness or fatigue Redness, blistering, peeling, or loosening of the skin, including inside the mouth Severe diarrhea, fever Severe or prolonged vomiting or diarrhea Unusual vaginal discharge, itching, or odor Side effects that usually do not require medical attention (report these to your care team if they continue or are bothersome): Diarrhea Nausea Vomiting This list may not describe all possible side effects. Call your doctor for medical advice about side effects. You may report side effects to FDA at 1-800-FDA-1088. Where should I keep my medication? Keep out of the reach of children and pets. Store at room temperature between 20 and 25 degrees C (68 and 77 degrees F). Throw away any unused medication after the expiration date. NOTE: This sheet is a summary. It may not cover all possible information. If you have questions about this medicine, talk to your doctor, pharmacist, or health care provider.  2024 Elsevier/Gold Standard (2023-03-29 00:00:00)

## 2023-10-02 NOTE — Progress Notes (Unsigned)
Established Patient Office Visit  Subjective   Patient ID: Christopher Douglas, male    DOB: July 09, 1991  Age: 32 y.o. MRN: [id number]  Chief Complaint  Patient presents with   Ear Fullness    Seen last week and started antibiotic, Patient states it doesn't seem to be helping     HPI Trial doxycycline {History (Optional):23778}  Review of Systems  Constitutional:  Negative for chills and fever.  HENT:  Positive for congestion and ear pain. Negative for sinus pain and sore throat.   Eyes: Negative.   Respiratory:  Negative for cough, sputum production and shortness of breath.   Cardiovascular:  Negative for chest pain.  Gastrointestinal:  Negative for abdominal pain, nausea and vomiting.  Genitourinary: Negative.   Musculoskeletal: Negative.   Skin: Negative.   Neurological: Negative.   Endo/Heme/Allergies: Negative.   Psychiatric/Behavioral: Negative.        Objective:     There were no vitals taken for this visit. {Vitals History (Optional):23777}  Physical Exam Vitals and nursing note reviewed.  Constitutional:      Appearance: Normal appearance.  HENT:     Head: Normocephalic and atraumatic.     Jaw: There is normal jaw occlusion.     Salivary Glands: Right salivary gland is not diffusely enlarged or tender. Left salivary gland is not diffusely enlarged or tender.     Right Ear: No drainage or tenderness. Tympanic membrane is erythematous.     Left Ear: No drainage or tenderness. Tympanic membrane is erythematous.     Nose:     Right Turbinates: Enlarged and swollen.     Left Turbinates: Enlarged and swollen.     Right Sinus: No maxillary sinus tenderness or frontal sinus tenderness.     Left Sinus: No maxillary sinus tenderness or frontal sinus tenderness.     Mouth/Throat:     Lips: Pink.     Mouth: Mucous membranes are moist.     Pharynx: Oropharynx is clear.  Eyes:     Extraocular Movements: Extraocular movements intact.     Conjunctiva/sclera: Conjunctivae  normal.     Pupils: Pupils are equal, round, and reactive to light.  Cardiovascular:     Rate and Rhythm: Normal rate and regular rhythm.     Pulses: Normal pulses.     Heart sounds: Normal heart sounds.  Pulmonary:     Effort: Pulmonary effort is normal.     Breath sounds: Normal breath sounds.  Musculoskeletal:        General: Normal range of motion.     Cervical back: Normal range of motion and neck supple.  Skin:    General: Skin is warm and dry.  Neurological:     General: No focal deficit present.     Mental Status: He is alert and oriented to person, place, and time.  Psychiatric:        Mood and Affect: Mood normal.        Behavior: Behavior normal.        Thought Content: Thought content normal.        Judgment: Judgment normal.      No results found for any visits on 10/02/23.  {Labs (Optional):23779}  The ASCVD Risk score (Arnett DK, et al., 2019) failed to calculate for the following reasons:   The 2019 ASCVD risk score is only valid for ages 30 to 33    Assessment & Plan:   Problem List Items Addressed This Visit   None  No follow-ups on file.    Kasandra Knudsen Mayers, PA-C

## 2023-10-10 ENCOUNTER — Other Ambulatory Visit: Payer: Self-pay

## 2023-10-10 ENCOUNTER — Ambulatory Visit: Payer: Self-pay | Attending: Family Medicine | Admitting: Family Medicine

## 2023-10-10 ENCOUNTER — Encounter: Payer: Self-pay | Admitting: Family Medicine

## 2023-10-10 VITALS — BP 112/70 | HR 74 | Ht 75.0 in | Wt 257.0 lb

## 2023-10-10 DIAGNOSIS — E1065 Type 1 diabetes mellitus with hyperglycemia: Secondary | ICD-10-CM

## 2023-10-10 DIAGNOSIS — J320 Chronic maxillary sinusitis: Secondary | ICD-10-CM

## 2023-10-10 DIAGNOSIS — I10 Essential (primary) hypertension: Secondary | ICD-10-CM

## 2023-10-10 LAB — POCT GLYCOSYLATED HEMOGLOBIN (HGB A1C): HbA1c, POC (controlled diabetic range): 11.1 % — AB (ref 0.0–7.0)

## 2023-10-10 MED ORDER — INSULIN LISPRO (1 UNIT DIAL) 100 UNIT/ML (KWIKPEN)
0.0000 [IU] | PEN_INJECTOR | Freq: Three times a day (TID) | SUBCUTANEOUS | 6 refills | Status: DC
  Filled 2023-10-10 – 2023-11-21 (×2): qty 12, 34d supply, fill #0
  Filled 2024-02-15: qty 9, 25d supply, fill #1
  Filled 2024-03-28: qty 9, 25d supply, fill #2
  Filled 2024-05-03 – 2024-05-31 (×2): qty 9, 25d supply, fill #3
  Filled 2024-09-12: qty 9, 25d supply, fill #4
  Filled 2024-10-07: qty 9, 25d supply, fill #5

## 2023-10-10 MED ORDER — PREDNISONE 20 MG PO TABS
20.0000 mg | ORAL_TABLET | Freq: Every day | ORAL | 0 refills | Status: DC
  Filled 2023-10-10: qty 5, 5d supply, fill #0

## 2023-10-10 NOTE — Patient Instructions (Signed)
VISIT SUMMARY:  During today's visit, we discussed your ongoing ear pain and muffled hearing, which have persisted despite previous treatments. We also reviewed your diabetes management, noting some inconsistencies in your medication regimen. Additionally, we touched on your hypertension and neuropathic pain, both of which are currently stable.  YOUR PLAN:  -SINUSITIS: Sinusitis is an inflammation of the sinuses that can cause symptoms like ear pain, muffled hearing, and headaches. Since your symptoms have not improved with antibiotics, we will start a course of prednisone to help reduce inflammation and pressure in your ear.  -TYPE 2 DIABETES MELLITUS: Type 2 Diabetes Mellitus is a condition where your body does not use insulin properly, leading to high blood sugar levels. Your A1c has improved but is still not at the target level. We will increase your Lantus to 35 units twice daily and adjust your Humalog to a sliding scale dosing. Please check your blood glucose levels regularly and consider a continuous glucose monitor if it becomes available through your insurance.  -HYPERTENSION: Hypertension is high blood pressure. Your condition appears stable, so we will continue your current medication regimen with Lisinopril.  -NEUROPATHIC PAIN: Neuropathic pain is pain caused by nerve damage. Since you have no new complaints, we will continue your current medication regimen with Gabapentin.  INSTRUCTIONS:  Please check your blood glucose levels regularly and adjust your Humalog dose according to the sliding scale provided. Return to the clinic for a follow-up appointment after you complete the prednisone course. We will also recheck your A1c in 3 months.

## 2023-10-10 NOTE — Progress Notes (Signed)
Subjective:  Patient ID: Christopher Douglas, male    DOB: [birth year]  Age: 32 y.o. MRN: [id number]  CC: Medical Management of Chronic Issues (Right ear pain)   HPI Christopher Douglas is a 32 y.o. year old male with a history of type 1 diabetes mellitus (A1c 11.1), hypertension, hyperlipidemia  Interval History: Discussed the use of AI scribe software for clinical note transcription with the patient, who gave verbal consent to proceed.  He presents with a three-week history of ear pain and muffled hearing.  He was treated for a sinus infection at the mobile unit and despite two courses of antibiotics (azithromycin and Augmentin), symptoms persist. He has been using over-the-counter allergy medication and Flonase, but reports no improvement. He denies fever and maintains a good appetite.  Regarding his diabetes, the patient reports blood glucose levels between 170 and 225. He has been taking Humalog after meals and Lantus twice daily, but admits to occasionally forgetting the evening dose of Lantus. He has been taking 30 units of Lantus instead of the prescribed 35 units. His most recent A1C was 11.1, a slight improvement from 12.3, but still not at goal.        Past Medical History:  Diagnosis Date   Asthma    Diabetes mellitus without complication (HCC)    Hypertension     Past Surgical History:  Procedure Laterality Date   FOOT SURGERY      Family History  Problem Relation Age of Onset   Diabetes Mellitus II Father    Hypertension Father    Alcohol abuse Sister    Alcohol abuse Brother    Diabetes Maternal Grandmother    Diabetes Maternal Grandfather    Hypertension Maternal Grandfather    Hypertension Mother     Social History   Socioeconomic History   Marital status: Single    Spouse name: Not on file   Number of children: Not on file   Years of education: Not on file   Highest education level: Not on file  Occupational History   Not on file  Tobacco Use   Smoking  status: Every Day    Current packs/day: 0.25    Average packs/day: 0.3 packs/day for 5.0 years (1.3 ttl pk-yrs)    Types: Cigarettes   Smokeless tobacco: Never  Vaping Use   Vaping status: Never Used  Substance and Sexual Activity   Alcohol use: No   Drug use: No   Sexual activity: Yes    Birth control/protection: Condom  Other Topics Concern   Not on file  Social History Narrative   Not on file   Social Determinants of Health   Financial Resource Strain: Low Risk  (10/10/2023)   Overall Financial Resource Strain (CARDIA)    Difficulty of Paying Living Expenses: Not very hard  Food Insecurity: Food Insecurity Present (10/10/2023)   Hunger Vital Sign    Worried About Running Out of Food in the Last Year: Never true    Ran Out of Food in the Last Year: Sometimes true  Transportation Needs: No Transportation Needs (10/10/2023)   PRAPARE - Administrator, Civil Service (Medical): No    Lack of Transportation (Non-Medical): No  Physical Activity: Sufficiently Active (10/10/2023)   Exercise Vital Sign    Days of Exercise per Week: 3 days    Minutes of Exercise per Session: 60 min  Stress: No Stress Concern Present (10/10/2023)   [employer] of Occupational Health - Occupational Stress Questionnaire  Feeling of Stress : Not at all  Social Connections: Moderately Isolated (10/10/2023)   Social Connection and Isolation Panel [NHANES]    Frequency of Communication with Friends and Family: Twice a week    Frequency of Social Gatherings with Friends and Family: Once a week    Attends Religious Services: More than 4 times per year    Active Member of [employer] or Organizations: No    Attends Banker Meetings: Never    Marital Status: Never married    Allergies  Allergen Reactions   Amoxicillin Hives    Did it involve swelling of the face/tongue/throat, SOB, or low BP? Unknown Did it involve sudden or severe rash/hives, skin peeling, or any reaction on  the inside of your mouth or nose? Yes Did you need to seek medical attention at a hospital or doctor's office? Unknown When did it last happen? childhood       If all above answers are "NO", may proceed with cephalosporin use.    Shellfish Allergy Hives    Specifically shrimp    Outpatient Medications Prior to Visit  Medication Sig Dispense Refill   albuterol (VENTOLIN HFA) 108 (90 Base) MCG/ACT inhaler Inhale 1-2 puffs into the lungs every 6 (six) hours as needed. 6.7 g 0   amoxicillin-clavulanate (AUGMENTIN) 875-125 MG tablet Take 1 tablet by mouth 2 (two) times daily. 20 tablet 0   atorvastatin (LIPITOR) 20 MG tablet Take 1 tablet (20 mg total) by mouth daily. 30 tablet 3   Blood Glucose Monitoring Suppl (TRUE METRIX METER) w/Device KIT Use as instructed three times daily before meals 1 kit 0   cetirizine (ZYRTEC) 10 MG tablet Take 1 tablet (10 mg total) by mouth daily. 30 tablet 1   Continuous Blood Gluc Receiver (FREESTYLE LIBRE 2 READER) DEVI Use as instructed three times daily before meals. E10.65 1 each 0   Continuous Blood Gluc Sensor (FREESTYLE LIBRE 2 SENSOR) MISC Use as instructed three times daily before meals. Change sensors every 14 days. E10.65 2 each 3   fluticasone (FLONASE) 50 MCG/ACT nasal spray Place 2 sprays into both nostrils daily. 16 g 6   gabapentin (NEURONTIN) 300 MG capsule Take 1 capsule (300 mg total) by mouth 2 (two) times daily. 60 capsule 3   glucose blood (TRUE METRIX BLOOD GLUCOSE TEST) test strip Use as instructed three times daily before meals 100 each 3   Insulin Glargine (BASAGLAR KWIKPEN) 100 UNIT/ML Inject 35 Units into the skin 2 (two) times daily. 15 mL 5   Insulin Pen Needle 31G X 5 MM MISC Use 4 (four) times daily -  with meals and at bedtime. 100 each 0   Insulin Syringe-Needle U-100 (TRUEPLUS INSULIN SYRINGE) 31G X 5/16" 0.3 ML MISC Use to inject Novolog Three times daily 100 each 2   lisinopril (ZESTRIL) 10 MG tablet Take 1 tablet (10 mg total)  by mouth daily. 30 tablet 3   montelukast (SINGULAIR) 10 MG tablet Take 1 tablet (10 mg total) by mouth at bedtime. 30 tablet 1   promethazine-dextromethorphan (PROMETHAZINE-DM) 6.25-15 MG/5ML syrup Take 5 mLs by mouth 4 (four) times daily as needed for cough. 118 mL 0   TRUEplus Lancets 28G MISC Use as instructed three times daily before meals 100 each 3   varenicline (CHANTIX) 1 MG tablet Take 1 tablet (1 mg total) by mouth 2 (two) times daily. 60 tablet 1   insulin lispro (HUMALOG KWIKPEN) 100 UNIT/ML KwikPen Inject 10 Units into the skin 3 (  three) times daily. 12 mL 4   fluticasone (FLOVENT HFA) 110 MCG/ACT inhaler Inhale 1 puff into the lungs in the morning and at bedtime. (Patient not taking: Reported on 10/10/2023) 1 each 12   ibuprofen (ADVIL) 800 MG tablet Take 1 tablet (800 mg total) by mouth 3 (three) times daily. (Patient not taking: Reported on 10/10/2023) 21 tablet 0   omeprazole (PRILOSEC) 20 MG capsule TAKE 1 CAPSULE (20 MG TOTAL) BY MOUTH DAILY. 30 capsule 3   No facility-administered medications prior to visit.     ROS Review of Systems  Constitutional:  Negative for activity change and appetite change.  HENT:  Positive for ear pain. Negative for sinus pressure and sore throat.   Respiratory:  Negative for chest tightness, shortness of breath and wheezing.   Cardiovascular:  Negative for chest pain and palpitations.  Gastrointestinal:  Negative for abdominal distention, abdominal pain and constipation.  Genitourinary: Negative.   Musculoskeletal: Negative.   Psychiatric/Behavioral:  Negative for behavioral problems and dysphoric mood.     Objective:  BP 112/70   Pulse 74   Ht 6\' 3"  (1.905 m)   Wt 257 lb (116.6 kg)   SpO2 99%   BMI 32.12 kg/m      10/10/2023    2:03 PM 10/02/2023   11:41 AM 10/02/2023   11:39 AM  BP/Weight  Systolic BP 112 160 161  Diastolic BP 70 96 98  Wt. (Lbs) 257  256  BMI 32.12 kg/m2  32 kg/m2      Physical Exam Constitutional:       Appearance: He is well-developed.  HENT:     Head:     Comments: Right maxillary sinus tenderness Minimal effusion in right ear    Left Ear: Tympanic membrane normal.  Cardiovascular:     Rate and Rhythm: Normal rate.     Heart sounds: Normal heart sounds. No murmur heard. Pulmonary:     Effort: Pulmonary effort is normal.     Breath sounds: Normal breath sounds. No wheezing or rales.  Chest:     Chest wall: No tenderness.  Abdominal:     General: Bowel sounds are normal. There is no distension.     Palpations: Abdomen is soft. There is no mass.     Tenderness: There is no abdominal tenderness.  Musculoskeletal:        General: Normal range of motion.     Right lower leg: No edema.     Left lower leg: No edema.  Neurological:     Mental Status: He is alert and oriented to person, place, and time.  Psychiatric:        Mood and Affect: Mood normal.        Latest Ref Rng & Units 07/06/2023    2:35 PM 06/22/2022    9:04 AM 12/14/2020   12:11 PM  CMP  Glucose 70 - 99 mg/dL 161  096  045   BUN 6 - 20 mg/dL 10  8  7    Creatinine 0.76 - 1.27 mg/dL 4.09  8.11  9.14   Sodium 134 - 144 mmol/L 138  140  141   Potassium 3.5 - 5.2 mmol/L 4.1  4.4  4.5   Chloride 96 - 106 mmol/L 98  103  103   CO2 20 - 29 mmol/L 24  22  25    Calcium 8.7 - 10.2 mg/dL 9.5  9.0  9.0   Total Protein 6.0 - 8.5 g/dL 7.2  6.3  7.5  Total Bilirubin 0.0 - 1.2 mg/dL 0.4  0.3  0.4   Alkaline Phos 44 - 121 IU/L 83  70  90   AST 0 - 40 IU/L 18  12  47   ALT 0 - 44 IU/L 16  11  29      Lipid Panel     Component Value Date/Time   CHOL 218 (H) 07/06/2023 1435   TRIG 71 07/06/2023 1435   HDL 68 07/06/2023 1435   CHOLHDL 3.2 07/06/2023 1435   CHOLHDL 4 09/16/2014 1546   VLDL 24.8 09/16/2014 1546   LDLCALC 138 (H) 07/06/2023 1435    CBC    Component Value Date/Time   WBC 7.7 07/06/2023 1435   WBC 6.3 07/03/2017 1538   RBC 5.47 07/06/2023 1435   RBC 5.20 07/03/2017 1538   HGB 14.9 07/06/2023 1435    HCT 45.3 07/06/2023 1435   PLT 214 07/06/2023 1435   MCV 83 07/06/2023 1435   MCH 27.2 07/06/2023 1435   MCH 28.5 07/03/2017 1538   MCHC 32.9 07/06/2023 1435   MCHC 34.6 07/03/2017 1538   RDW 13.9 07/06/2023 1435   LYMPHSABS 2.3 07/06/2023 1435   MONOABS 0.8 04/13/2017 0526   EOSABS 0.2 07/06/2023 1435   BASOSABS 0.0 07/06/2023 1435    Lab Results  Component Value Date   HGBA1C 11.1 (A) 10/10/2023    Assessment & Plan:      Chronic sinusitis Persistent symptoms despite two courses of antibiotics (azithromycin and Augmentin). No fever, but reports muffled hearing and headaches. -Plan to prescribe a course of prednisone to reduce inflammation and pressure in the ear.  Type 1 Diabetes Mellitus A1c of 11.1, down from 12.3, but still not at goal. Reports blood glucose levels between 170-225. Admits to inconsistent dosing of Lantus, taking 30 units instead of prescribed 35 units twice daily, and sometimes forgetting the evening dose. -Increase Lantus to 35 units twice daily. -Adjust Humalog to sliding scale dosing For blood sugars 0-150 give 0 units of insulin, 151-200 give 2 units of insulin, 201-250 give 4 units, 251-300 give 6 units, 301-350 give 8 units, 351-400 give 10 units,> 400 give 12 units and call M.D. Discussed hypoglycemia protocol. -Consider continuous glucose monitor if insurance coverage becomes available as he will be an ideal candidate -Check A1c in 3 months.  Hypertension No specific complaints or changes reported. -Continue current medication regimen (Lisinopril).  Neuropathic Pain No specific complaints or changes reported. -Continue current medication regimen (Gabapentin).  Follow-up -Check blood glucose levels regularly and adjust Humalog dose according to sliding scale. -Return to clinic for follow-up after completion of prednisone course.          Meds ordered this encounter  Medications   predniSONE (DELTASONE) 20 MG tablet    Sig: Take 1  tablet (20 mg total) by mouth daily with breakfast.    Dispense:  5 tablet    Refill:  0   insulin lispro (HUMALOG KWIKPEN) 100 UNIT/ML KwikPen    Sig: Inject 0-12 Units into the skin 3 (three) times daily. According to sliding scale    Dispense:  12 mL    Refill:  6    For blood sugars 0-150 give 0 units of insulin, 151-200 give 2 units of insulin, 201-250 give 4 units, 251-300 give 6 units, 301-350 give 8 units, 351-400 give 10 units,> 400 give 12 units    Follow-up: Return in about 3 months (around 01/10/2024) for Chronic medical conditions, cancel previous appointment.  Hoy Register, MD, FAAFP. [facility] [place], [place] [phone]   10/10/2023, 2:27 PM

## 2023-10-11 ENCOUNTER — Ambulatory Visit: Payer: Self-pay | Admitting: Physician Assistant

## 2023-11-20 ENCOUNTER — Ambulatory Visit: Payer: Self-pay

## 2023-11-20 NOTE — Telephone Encounter (Signed)
  Chief Complaint: right ear pain s/p sinus issues  Symptoms: right ear pain . Sinus issues started last Thursday . Cough.  Frequency: Friday  Pertinent Negatives: Patient denies fever Disposition: [] ED /[] Urgent Care (no appt availability in office) / [] Appointment(In office/virtual)/ []  [facility]/ [] Home Care/ [] Refused Recommended Disposition /[x] [facility]/ []  Follow-up with PCP Additional Notes:   No available apt today . Recommended mobile bus tomorrow or UC. Please advise if appt can be scheduled.    Summary: right ear pain, sinus issues   Pt called in to schedule an appt. No appt available. Pt is having sinus issues and ear pain in his right ear. Pt states that this happened last month as well. Please advise.             Reason for Disposition  Earache  (Exceptions: brief ear pain of < 60 minutes duration, earache occurring during air travel  Answer Assessment - Initial Assessment Questions 1. LOCATION: "Which ear is involved?"     Right ear  2. ONSET: "When did the ear start hurting"      Thursday  3. SEVERITY: "How bad is the pain?"  (Scale 1-10; mild, moderate or severe)   - MILD (1-3): doesn't interfere with normal activities    - MODERATE (4-7): interferes with normal activities or awakens from sleep    - SEVERE (8-10): excruciating pain, unable to do any normal activities      Difficulty sleeping at night 4. URI SYMPTOMS: "Do you have a runny nose or cough?"     Sinus issues cough last week  5. FEVER: "Do you have a fever?" If Yes, ask: "What is your temperature, how was it measured, and when did it start?"     na 6. CAUSE: "Have you been swimming recently?", "How often do you use Q-TIPS?", "Have you had any recent air travel or scuba diving?"     na 7. OTHER SYMPTOMS: "Do you have any other symptoms?" (e.g., headache, stiff neck, dizziness, vomiting, runny nose, decreased hearing)     Sinus pain , headache right ear pain 8. PREGNANCY:  "Is there any chance you are pregnant?" "When was your last menstrual period?"     na  Protocols used: Davina Poke

## 2023-11-20 NOTE — Telephone Encounter (Signed)
Patient called, left VM to return the call to the office to speak to the NT.   Summary: right ear pain, sinus issues   Pt called in to schedule an appt. No appt available. Pt is having sinus issues and ear pain in his right ear. Pt states that this happened last month as well. Please advise.

## 2023-11-21 ENCOUNTER — Encounter: Payer: Self-pay | Admitting: Physician Assistant

## 2023-11-21 ENCOUNTER — Other Ambulatory Visit: Payer: Self-pay | Admitting: Family Medicine

## 2023-11-21 ENCOUNTER — Other Ambulatory Visit: Payer: Self-pay

## 2023-11-21 ENCOUNTER — Ambulatory Visit: Payer: Self-pay | Admitting: Physician Assistant

## 2023-11-21 VITALS — BP 140/87 | HR 68 | Ht 75.0 in | Wt 256.0 lb

## 2023-11-21 DIAGNOSIS — I10 Essential (primary) hypertension: Secondary | ICD-10-CM

## 2023-11-21 DIAGNOSIS — E1065 Type 1 diabetes mellitus with hyperglycemia: Secondary | ICD-10-CM

## 2023-11-21 DIAGNOSIS — F17210 Nicotine dependence, cigarettes, uncomplicated: Secondary | ICD-10-CM

## 2023-11-21 DIAGNOSIS — H9201 Otalgia, right ear: Secondary | ICD-10-CM

## 2023-11-21 MED ORDER — PREDNISONE 20 MG PO TABS
20.0000 mg | ORAL_TABLET | Freq: Every day | ORAL | 0 refills | Status: DC
  Filled 2023-11-21: qty 5, 5d supply, fill #0

## 2023-11-21 MED ORDER — TRUE METRIX BLOOD GLUCOSE TEST VI STRP
ORAL_STRIP | 3 refills | Status: AC
  Filled 2023-11-21: qty 100, 33d supply, fill #0
  Filled 2024-04-11: qty 100, 33d supply, fill #1

## 2023-11-21 NOTE — Progress Notes (Signed)
 Established Patient Office Visit  Subjective   Patient ID: Christopher Douglas, male    DOB: 1991/07/31  Age: 32 y.o. MRN: [id number]  Chief Complaint  Patient presents with   Ear Pain    Right ear, denies any congestion currently    Discussed the use of AI scribe software for clinical note transcription with the patient, who gave verbal consent to proceed.  History of Present Illness         History of Present Illness The patient, with a history of hypertension, presented with a recurrence of symptoms that began approximately two weeks ago. Initially, they experienced chest congestion, which was followed by a severe headache localized to the right side. After the headache subsided, they were left with right ear discomfort and a sensation akin to a 'muffled sound.' They denied any nasal congestion and reported no difficulty in breathing.  To alleviate the symptoms, the patient had been using sinus and cough medications, which reportedly improved the chest congestion. They had also been taking Singulair  and Flonase , particularly during seasonal changes when their allergies tend to flare up. However, these interventions provided no relief for the ear discomfort.  The patient reported a single episode of night sweats occurring shortly after Christmas but denied any fever, chills, nausea, or vomiting. They also reported no illness among household members.   The patient has been compliant with their hypertension medication, although they have not yet obtained a home blood pressure monitor.  The patient also mentioned the use of in-ear Bluetooth devices, primarily for work-related communication. They expressed concern that these devices might be contributing to their ear discomfort. They reported a previous successful treatment with oral steroids for similar symptoms. Past Medical History:  Diagnosis Date   Asthma    Diabetes mellitus without complication (HCC)    Hypertension    Social History    Socioeconomic History   Marital status: Single    Spouse name: Not on file   Number of children: Not on file   Years of education: Not on file   Highest education level: Not on file  Occupational History   Not on file  Tobacco Use   Smoking status: Every Day    Current packs/day: 0.25    Average packs/day: 0.3 packs/day for 5.0 years (1.3 ttl pk-yrs)    Types: Cigarettes   Smokeless tobacco: Never  Vaping Use   Vaping status: Never Used  Substance and Sexual Activity   Alcohol use: No   Drug use: No   Sexual activity: Yes    Birth control/protection: Condom  Other Topics Concern   Not on file  Social History Narrative   Not on file   Social Drivers of Health   Financial Resource Strain: Low Risk  (10/10/2023)   Overall Financial Resource Strain (CARDIA)    Difficulty of Paying Living Expenses: Not very hard  Food Insecurity: Food Insecurity Present (10/10/2023)   Hunger Vital Sign    Worried About Running Out of Food in the Last Year: Never true    Ran Out of Food in the Last Year: Sometimes true  Transportation Needs: No Transportation Needs (10/10/2023)   PRAPARE - Administrator, Civil Service (Medical): No    Lack of Transportation (Non-Medical): No  Physical Activity: Sufficiently Active (10/10/2023)   Exercise Vital Sign    Days of Exercise per Week: 3 days    Minutes of Exercise per Session: 60 min  Stress: No Stress Concern Present (10/10/2023)  [employer] of Occupational Health - Occupational Stress Questionnaire    Feeling of Stress : Not at all  Social Connections: Moderately Isolated (10/10/2023)   Social Connection and Isolation Panel [NHANES]    Frequency of Communication with Friends and Family: Twice a week    Frequency of Social Gatherings with Friends and Family: Once a week    Attends Religious Services: More than 4 times per year    Active Member of [employer] or Organizations: No    Attends Banker Meetings: Never     Marital Status: Never married  Intimate Partner Violence: Not At Risk (10/10/2023)   Humiliation, Afraid, Rape, and Kick questionnaire    Fear of Current or Ex-Partner: No    Emotionally Abused: No    Physically Abused: No    Sexually Abused: No   Family History  Problem Relation Age of Onset   Diabetes Mellitus II Father    Hypertension Father    Alcohol abuse Sister    Alcohol abuse Brother    Diabetes Maternal Grandmother    Diabetes Maternal Grandfather    Hypertension Maternal Grandfather    Hypertension Mother    Allergies  Allergen Reactions   Amoxicillin  Hives    Did it involve swelling of the face/tongue/throat, SOB, or low BP? Unknown Did it involve sudden or severe rash/hives, skin peeling, or any reaction on the inside of your mouth or nose? Yes Did you need to seek medical attention at a hospital or doctor's office? Unknown When did it last happen? childhood       If all above answers are "NO", may proceed with cephalosporin use.    Shellfish Allergy Hives    Specifically shrimp    Review of Systems  Constitutional:  Negative for chills and fever.  HENT:  Positive for ear pain. Negative for congestion, ear discharge, hearing loss, sore throat and tinnitus.   Eyes: Negative.   Respiratory:  Negative for cough and shortness of breath.   Cardiovascular:  Negative for chest pain.  Gastrointestinal: Negative.   Genitourinary: Negative.   Musculoskeletal: Negative.   Skin: Negative.   Neurological: Negative.   Endo/Heme/Allergies: Negative.   Psychiatric/Behavioral: Negative.        Objective:     BP (!) 140/87 (BP Location: Left Arm, Patient Position: Sitting, Cuff Size: Large)   Pulse 68   Ht 6' 3 (1.905 m)   Wt 256 lb (116.1 kg)   SpO2 99%   BMI 32.00 kg/m  BP Readings from Last 3 Encounters:  11/21/23 (!) 140/87  10/10/23 112/70  10/02/23 (!) 160/96   Wt Readings from Last 3 Encounters:  11/21/23 256 lb (116.1 kg)  10/10/23 257 lb  (116.6 kg)  10/02/23 256 lb (116.1 kg)    Physical Exam Vitals and nursing note reviewed.  Constitutional:      Appearance: Normal appearance.  HENT:     Head: Normocephalic and atraumatic.     Right Ear: Tenderness present. There is no impacted cerumen. Tympanic membrane is erythematous.     Left Ear: Tenderness present. There is no impacted cerumen. Tympanic membrane is erythematous.     Ears:     Comments: Both canals with erythema    Nose: Nose normal.     Mouth/Throat:     Mouth: Mucous membranes are moist.     Pharynx: Oropharynx is clear.  Eyes:     Extraocular Movements: Extraocular movements intact.     Conjunctiva/sclera: Conjunctivae normal.  Pupils: Pupils are equal, round, and reactive to light.  Cardiovascular:     Rate and Rhythm: Normal rate and regular rhythm.     Pulses: Normal pulses.     Heart sounds: Normal heart sounds.  Pulmonary:     Effort: Pulmonary effort is normal.     Breath sounds: Normal breath sounds.  Musculoskeletal:        General: Normal range of motion.     Cervical back: Normal range of motion and neck supple.  Skin:    General: Skin is warm and dry.  Neurological:     General: No focal deficit present.     Mental Status: He is alert and oriented to person, place, and time.  Psychiatric:        Mood and Affect: Mood normal.        Behavior: Behavior normal.        Thought Content: Thought content normal.        Judgment: Judgment normal.        Assessment & Plan:   Problem List Items Addressed This Visit       Cardiovascular and Mediastinum   Hypertension   Other Visit Diagnoses       Right ear pain    -  Primary   Relevant Medications   predniSONE  (DELTASONE ) 20 MG tablet   Other Relevant Orders   Ambulatory referral to ENT     1. Right ear pain (Primary)  Recurrent Ear Pain Right-sided ear pain with a sensation of fullness and muffled hearing. No signs of infection. Possible irritation from in-ear Bluetooth  device. Previous relief with oral steroids. -Refer to Ear, Nose, and Throat specialist for further evaluation. -Prescribe oral steroids as they provided relief previously. -Advise to consider over-the-ear device for phone use at work. - predniSONE  (DELTASONE ) 20 MG tablet; Take 1 tablet (20 mg total) by mouth daily with breakfast.  Dispense: 5 tablet; Refill: 0  2. Primary hypertension Continue current regimen, encouraged to check blood pressure at home, keep a written log and have available for all office visits.   I have reviewed the patient's medical history (PMH, PSH, Social History, Family History, Medications, and allergies) , and have been updated if relevant. I spent 30 minutes reviewing chart and  face to face time with patient.     Return if symptoms worsen or fail to improve.    Kirk RAMAN Mayers, PA-C

## 2023-11-21 NOTE — Telephone Encounter (Signed)
 Patient being seen at [facility] today

## 2023-11-21 NOTE — Patient Instructions (Signed)
 You are going to take prednisone  once daily for 5 days.  I have started a referral for you to be seen by ear nose and throat for further evaluation.  I encourage you to take the Singulair  on a daily basis, and use the Flonase  as needed.  Kirk CANDIE Sage, PA-C Physician Assistant [facility] [email or web address]   Earache, Adult An earache, or ear pain, can be caused by many things, including: An infection. Ear wax buildup. Ear pressure. Something in the ear that should not be there (foreign body). A sore throat. Tooth problems. Jaw problems. Treatment of the earache will depend on the cause. If the cause is not clear or cannot be known, you may need to watch your symptoms until your earache goes away or until a cause is found. Follow these instructions at home: Medicines Take or apply over-the-counter and prescription medicines only as told by your health care provider. If you were prescribed antibiotics, use them as told by your health care provider. Do not stop using the antibiotic even if you start to feel better. Do not put anything in your ear other than medicine that is prescribed by your health care provider. Managing pain     If directed, apply heat to the affected area as often as told by your health care provider. Use the heat source that your health care provider recommends, such as a moist heat pack or a heating pad. Place a towel between your skin and the heat source. Leave the heat on for 20-30 minutes. If your skin turns bright red, remove the heat right away to prevent burns. The risk of burns is higher if you cannot feel pain, heat, or cold. If directed, put ice on the affected area. To do this: Put ice in a plastic bag. Place a towel between your skin and the bag. Leave the ice on for 20 minutes, 2-3 times a day. If your skin turns bright red, remove the ice right away to prevent skin damage. The risk of skin  damage is higher if you cannot feel pain, heat, or cold.  General instructions Pay attention to any changes in your symptoms. Try resting in an upright position instead of lying down. This may help to reduce pressure in your ear and relieve pain. Chew gum if it helps to relieve your ear pain. Treat any allergies as told by your health care provider. Drink enough fluid to keep your urine pale yellow. It is up to you to get the results of any tests that were done. Ask your health care provider, or the department that is doing the tests, when your results will be ready. Contact a health care provider if: Your pain does not improve within 2 days. Your earache gets worse. You have new symptoms. You have a fever. Get help right away if: You have a severe headache. You have a stiff neck. You have trouble swallowing. You have redness or swelling behind your ear. You have fluid or blood coming from your ear. You have hearing loss. You feel dizzy. This information is not intended to replace advice given to you by your health care provider. Make sure you discuss any questions you have with your health care provider. Document Revised: 03/21/2022 Document Reviewed: 03/21/2022 Elsevier Patient Education  2024 [employer].

## 2023-11-22 ENCOUNTER — Encounter: Payer: Self-pay | Admitting: Physician Assistant

## 2023-11-24 ENCOUNTER — Other Ambulatory Visit: Payer: Self-pay

## 2023-11-28 ENCOUNTER — Ambulatory Visit: Payer: Self-pay | Admitting: Family Medicine

## 2023-12-07 ENCOUNTER — Other Ambulatory Visit: Payer: Self-pay

## 2023-12-07 DIAGNOSIS — J329 Chronic sinusitis, unspecified: Secondary | ICD-10-CM | POA: Diagnosis not present

## 2023-12-07 DIAGNOSIS — H6501 Acute serous otitis media, right ear: Secondary | ICD-10-CM | POA: Diagnosis not present

## 2023-12-07 MED ORDER — DOXYCYCLINE MONOHYDRATE 100 MG PO CAPS
100.0000 mg | ORAL_CAPSULE | Freq: Two times a day (BID) | ORAL | 0 refills | Status: DC
  Filled 2023-12-07: qty 20, 10d supply, fill #0

## 2023-12-21 ENCOUNTER — Other Ambulatory Visit: Payer: Self-pay

## 2023-12-21 DIAGNOSIS — H6501 Acute serous otitis media, right ear: Secondary | ICD-10-CM | POA: Diagnosis not present

## 2023-12-21 DIAGNOSIS — J329 Chronic sinusitis, unspecified: Secondary | ICD-10-CM | POA: Diagnosis not present

## 2023-12-21 DIAGNOSIS — H9011 Conductive hearing loss, unilateral, right ear, with unrestricted hearing on the contralateral side: Secondary | ICD-10-CM | POA: Diagnosis not present

## 2023-12-21 DIAGNOSIS — J392 Other diseases of pharynx: Secondary | ICD-10-CM | POA: Diagnosis not present

## 2023-12-21 MED ORDER — PREDNISONE 10 MG (21) PO TBPK
ORAL_TABLET | ORAL | 0 refills | Status: AC
  Filled 2023-12-21: qty 21, 6d supply, fill #0

## 2023-12-22 ENCOUNTER — Other Ambulatory Visit: Payer: Self-pay | Admitting: Physician Assistant

## 2023-12-22 ENCOUNTER — Other Ambulatory Visit: Payer: Self-pay

## 2023-12-22 DIAGNOSIS — J392 Other diseases of pharynx: Secondary | ICD-10-CM

## 2023-12-26 ENCOUNTER — Telehealth (INDEPENDENT_AMBULATORY_CARE_PROVIDER_SITE_OTHER): Payer: Self-pay | Admitting: Otolaryngology

## 2023-12-26 ENCOUNTER — Other Ambulatory Visit: Payer: Self-pay

## 2023-12-26 NOTE — Telephone Encounter (Signed)
Left vm to confirm appt and address for 12/27/2023.

## 2023-12-27 ENCOUNTER — Institutional Professional Consult (permissible substitution) (INDEPENDENT_AMBULATORY_CARE_PROVIDER_SITE_OTHER): Payer: Self-pay

## 2024-01-05 ENCOUNTER — Other Ambulatory Visit: Payer: Self-pay

## 2024-01-08 ENCOUNTER — Ambulatory Visit
Admission: RE | Admit: 2024-01-08 | Discharge: 2024-01-08 | Disposition: A | Discharge status: Home / Self Care | Payer: BC Managed Care – PPO | Source: Ambulatory Visit | Attending: Physician Assistant | Admitting: Physician Assistant

## 2024-01-08 DIAGNOSIS — J45909 Unspecified asthma, uncomplicated: Secondary | ICD-10-CM | POA: Diagnosis not present

## 2024-01-08 DIAGNOSIS — J392 Other diseases of pharynx: Secondary | ICD-10-CM | POA: Diagnosis not present

## 2024-01-08 DIAGNOSIS — I10 Essential (primary) hypertension: Secondary | ICD-10-CM | POA: Diagnosis not present

## 2024-01-08 DIAGNOSIS — E119 Type 2 diabetes mellitus without complications: Secondary | ICD-10-CM | POA: Diagnosis not present

## 2024-01-08 MED ORDER — IOPAMIDOL (ISOVUE-370) INJECTION 76%
75.0000 mL | Freq: Once | INTRAVENOUS | Status: AC | PRN
  Administered 2024-01-08: 75 mL via INTRAVENOUS

## 2024-01-15 ENCOUNTER — Other Ambulatory Visit: Payer: Self-pay

## 2024-01-15 ENCOUNTER — Ambulatory Visit: Payer: BC Managed Care – PPO | Attending: Family Medicine | Admitting: Family Medicine

## 2024-01-15 ENCOUNTER — Other Ambulatory Visit: Payer: Self-pay | Admitting: Pharmacist

## 2024-01-15 ENCOUNTER — Encounter: Payer: Self-pay | Admitting: Family Medicine

## 2024-01-15 VITALS — BP 126/76 | HR 92 | Ht 75.0 in | Wt 260.8 lb

## 2024-01-15 DIAGNOSIS — E785 Hyperlipidemia, unspecified: Secondary | ICD-10-CM

## 2024-01-15 DIAGNOSIS — J45909 Unspecified asthma, uncomplicated: Secondary | ICD-10-CM | POA: Diagnosis not present

## 2024-01-15 DIAGNOSIS — F17210 Nicotine dependence, cigarettes, uncomplicated: Secondary | ICD-10-CM

## 2024-01-15 DIAGNOSIS — E1049 Type 1 diabetes mellitus with other diabetic neurological complication: Secondary | ICD-10-CM

## 2024-01-15 DIAGNOSIS — J320 Chronic maxillary sinusitis: Secondary | ICD-10-CM

## 2024-01-15 DIAGNOSIS — R29818 Other symptoms and signs involving the nervous system: Secondary | ICD-10-CM

## 2024-01-15 DIAGNOSIS — I152 Hypertension secondary to endocrine disorders: Secondary | ICD-10-CM

## 2024-01-15 DIAGNOSIS — J329 Chronic sinusitis, unspecified: Secondary | ICD-10-CM | POA: Diagnosis not present

## 2024-01-15 DIAGNOSIS — K219 Gastro-esophageal reflux disease without esophagitis: Secondary | ICD-10-CM | POA: Diagnosis not present

## 2024-01-15 DIAGNOSIS — R0683 Snoring: Secondary | ICD-10-CM

## 2024-01-15 DIAGNOSIS — Z23 Encounter for immunization: Secondary | ICD-10-CM | POA: Diagnosis not present

## 2024-01-15 DIAGNOSIS — E1065 Type 1 diabetes mellitus with hyperglycemia: Secondary | ICD-10-CM

## 2024-01-15 DIAGNOSIS — E119 Type 2 diabetes mellitus without complications: Secondary | ICD-10-CM | POA: Diagnosis not present

## 2024-01-15 DIAGNOSIS — E1159 Type 2 diabetes mellitus with other circulatory complications: Secondary | ICD-10-CM

## 2024-01-15 DIAGNOSIS — R059 Cough, unspecified: Secondary | ICD-10-CM

## 2024-01-15 DIAGNOSIS — J0110 Acute frontal sinusitis, unspecified: Secondary | ICD-10-CM

## 2024-01-15 DIAGNOSIS — J4520 Mild intermittent asthma, uncomplicated: Secondary | ICD-10-CM

## 2024-01-15 DIAGNOSIS — Z794 Long term (current) use of insulin: Secondary | ICD-10-CM

## 2024-01-15 DIAGNOSIS — R5383 Other fatigue: Secondary | ICD-10-CM

## 2024-01-15 LAB — POCT GLYCOSYLATED HEMOGLOBIN (HGB A1C): HbA1c, POC (controlled diabetic range): 11.6 % — AB (ref 0.0–7.0)

## 2024-01-15 MED ORDER — FLUTICASONE PROPIONATE 50 MCG/ACT NA SUSP
2.0000 | Freq: Every day | NASAL | 6 refills | Status: AC
Start: 2024-01-15 — End: ?
  Filled 2024-01-15: qty 16, 30d supply, fill #0

## 2024-01-15 MED ORDER — GABAPENTIN 300 MG PO CAPS
300.0000 mg | ORAL_CAPSULE | Freq: Two times a day (BID) | ORAL | 1 refills | Status: AC
  Filled 2024-01-15: qty 180, 90d supply, fill #0

## 2024-01-15 MED ORDER — MONTELUKAST SODIUM 10 MG PO TABS
10.0000 mg | ORAL_TABLET | Freq: Every day | ORAL | 1 refills | Status: AC
Start: 2024-01-15 — End: ?
  Filled 2024-01-15: qty 90, 90d supply, fill #0

## 2024-01-15 MED ORDER — OMEPRAZOLE 20 MG PO CPDR
20.0000 mg | DELAYED_RELEASE_CAPSULE | Freq: Every day | ORAL | 3 refills | Status: AC
  Filled 2024-01-15: qty 30, 30d supply, fill #0

## 2024-01-15 MED ORDER — FLUTICASONE PROPIONATE HFA 110 MCG/ACT IN AERO
1.0000 | INHALATION_SPRAY | Freq: Two times a day (BID) | RESPIRATORY_TRACT | 12 refills | Status: DC
  Filled 2024-01-15: qty 12, 25d supply, fill #0

## 2024-01-15 MED ORDER — ARNUITY ELLIPTA 100 MCG/ACT IN AEPB
1.0000 | INHALATION_SPRAY | Freq: Every day | RESPIRATORY_TRACT | 2 refills | Status: AC
  Filled 2024-01-15: qty 30, 30d supply, fill #0

## 2024-01-15 MED ORDER — ALBUTEROL SULFATE HFA 108 (90 BASE) MCG/ACT IN AERS
1.0000 | INHALATION_SPRAY | Freq: Four times a day (QID) | RESPIRATORY_TRACT | 6 refills | Status: AC | PRN
  Filled 2024-01-15: qty 6.7, 25d supply, fill #0

## 2024-01-15 MED ORDER — FREESTYLE LIBRE 3 SENSOR MISC
1.0000 | 6 refills | Status: DC
  Filled 2024-01-15: qty 2, 28d supply, fill #0
  Filled 2024-06-06: qty 3, 30d supply, fill #0

## 2024-01-15 NOTE — Patient Instructions (Signed)
 VISIT SUMMARY:  During today's visit, we discussed several health concerns including your acid reflux, asthma, sinus issues, diabetes management, potential sleep apnea, and smoking cessation. We have made adjustments to your medications and planned further evaluations to better manage your conditions.  YOUR PLAN:  -GASTROESOPHAGEAL REFLUX DISEASE (GERD): GERD is a condition where stomach acid frequently flows back into the tube connecting your mouth and stomach, causing irritation. We are starting you on Protonix to help manage your acid reflux. Please avoid foods and drinks that trigger your reflux and do not lie down immediately after eating.  -CHRONIC SINUSITIS: Chronic sinusitis is a condition where the cavities around your nasal passages become inflamed and swollen for at least 12 weeks. To help with mucus drainage, please resume using the Flonase nasal spray as prescribed.  -ASTHMA: Asthma is a condition in which your airways narrow and swell and may produce extra mucus, leading to difficulty in breathing. To help manage your asthma symptoms, please resume using Singulair as prescribed.  -TYPE 2 DIABETES MELLITUS: Type 2 diabetes is a chronic condition that affects the way your body processes blood sugar (glucose). Your A1c level indicates poor blood sugar control. We are increasing your Basaglar insulin to 35 units twice daily. You will also be referred to a nutritionist for dietary management and scheduled for a follow-up with a pharmacist in 1 month to adjust your insulin based on blood glucose meter readings. We are also ordering a Freestyle Libre glucometer for real-time blood glucose monitoring.  -SLEEP APNEA: Sleep apnea is a potentially serious sleep disorder in which breathing repeatedly stops and starts. We are ordering a sleep study to evaluate for sleep apnea due to your reports of snoring and daytime fatigue.  -HYPERLIPIDEMIA: Hyperlipidemia is a condition where there are high  levels of fat particles (lipids) in the blood. We are ordering a fasting lipid panel for next week to check your cholesterol levels.  -SMOKING CESSATION: Smoking cessation is the process of discontinuing tobacco smoking. You have reported reduced smoking frequency while on Chantix. Please continue taking Chantix as prescribed to help you quit smoking.  INSTRUCTIONS:  Please return for blood work next week. You have a follow-up appointment in 1 month with the pharmacist to adjust your insulin regimen based on Freestyle Libre readings. Additionally, we will schedule a sleep study to evaluate for sleep apnea.

## 2024-01-15 NOTE — Progress Notes (Signed)
 Subjective:  Patient ID: Christopher Douglas, male    DOB: July 15, 1991  Age: 33 y.o. MRN: [id number]  CC: Medical Management of Chronic Issues (Coughing 1 week/Acid reflux/Not sleeping)   HPI Christopher Douglas is a 33 y.o. year old male with a history of type 1 diabetes mellitus (A1c 11.6), hypertension, hyperlipidemia, asthma and nicotine dependence  Interval History: Discussed the use of AI scribe software for clinical note transcription with the patient, who gave verbal consent to proceed.  He presents with multiple concerns. He reports experiencing severe acid reflux, which is triggered by certain foods and drinks, particularly acidic ones. Over-the-counter remedies like Tums have not been effective. The patient also reports a persistent cough, which he believes may be linked to his acid reflux and is exacerbating his asthma. He has been using his inhaler more frequently as a result. The patient also mentions experiencing post-nasal drip and has been using a saline nasal spray to manage this.  The patient has recently seen an ear, nose, and throat specialist and completed a course of steroids and antibiotics. He reports that his ears have been fine since then.  He is nasopharyngoscope he did reveal slightly enlarged adenoids.  However, he has not been using the prescribed Flonase nasal spray.  The patient also reports sleep disturbances, which he attributes to his coughing and possible sleep apnea. He mentions snoring and feeling tired during the day, but has not had a sleep study done. He also reports occasional headaches during the day, which he manages with ibuprofen.  Regarding his diabetes, the patient admits to not consistently checking his blood sugar levels and not always taking his Humalog as prescribed. He also mentions that he has been taking 60 units of Basaglar daily but he should be taking 35 units twice daily.      Past Medical History:  Diagnosis Date   Asthma    Diabetes mellitus  without complication (HCC)    Hypertension     Past Surgical History:  Procedure Laterality Date   FOOT SURGERY      Family History  Problem Relation Age of Onset   Diabetes Mellitus II Father    Hypertension Father    Alcohol abuse Sister    Alcohol abuse Brother    Diabetes Maternal Grandmother    Diabetes Maternal Grandfather    Hypertension Maternal Grandfather    Hypertension Mother     Social History   Socioeconomic History   Marital status: Single    Spouse name: Not on file   Number of children: Not on file   Years of education: Not on file   Highest education level: Not on file  Occupational History   Not on file  Tobacco Use   Smoking status: Every Day    Current packs/day: 0.25    Average packs/day: 0.3 packs/day for 5.0 years (1.3 ttl pk-yrs)    Types: Cigarettes   Smokeless tobacco: Never  Vaping Use   Vaping status: Never Used  Substance and Sexual Activity   Alcohol use: No   Drug use: No   Sexual activity: Yes    Birth control/protection: Condom  Other Topics Concern   Not on file  Social History Narrative   Not on file   Social Drivers of Health   Financial Resource Strain: Low Risk  (10/10/2023)   Overall Financial Resource Strain (CARDIA)    Difficulty of Paying Living Expenses: Not very hard  Food Insecurity: Food Insecurity Present (10/10/2023)   Hunger  Vital Sign    Worried About Programme researcher, broadcasting/film/video in the Last Year: Never true    Ran Out of Food in the Last Year: Sometimes true  Transportation Needs: No Transportation Needs (10/10/2023)   PRAPARE - Administrator, Civil Service (Medical): No    Lack of Transportation (Non-Medical): No  Physical Activity: Sufficiently Active (10/10/2023)   Exercise Vital Sign    Days of Exercise per Week: 3 days    Minutes of Exercise per Session: 60 min  Stress: No Stress Concern Present (10/10/2023)   [employer] of Occupational Health - Occupational Stress Questionnaire     Feeling of Stress : Not at all  Social Connections: Moderately Isolated (10/10/2023)   Social Connection and Isolation Panel [NHANES]    Frequency of Communication with Friends and Family: Twice a week    Frequency of Social Gatherings with Friends and Family: Once a week    Attends Religious Services: More than 4 times per year    Active Member of [employer] or Organizations: No    Attends Banker Meetings: Never    Marital Status: Never married    Allergies  Allergen Reactions   Amoxicillin Hives    Did it involve swelling of the face/tongue/throat, SOB, or low BP? Unknown Did it involve sudden or severe rash/hives, skin peeling, or any reaction on the inside of your mouth or nose? Yes Did you need to seek medical attention at a hospital or doctor's office? Unknown When did it last happen? childhood       If all above answers are "NO", may proceed with cephalosporin use.    Shellfish Allergy Hives    Specifically shrimp    Outpatient Medications Prior to Visit  Medication Sig Dispense Refill   amoxicillin-clavulanate (AUGMENTIN) 875-125 MG tablet Take 1 tablet by mouth 2 (two) times daily. 20 tablet 0   atorvastatin (LIPITOR) 20 MG tablet Take 1 tablet (20 mg total) by mouth daily. 30 tablet 3   Blood Glucose Monitoring Suppl (TRUE METRIX METER) w/Device KIT Use as instructed three times daily before meals 1 kit 0   cetirizine (ZYRTEC) 10 MG tablet Take 1 tablet (10 mg total) by mouth daily. 30 tablet 1   doxycycline (MONODOX) 100 MG capsule Take 1 capsule (100 mg total) by mouth 2 (two) times daily for 10 days. Take with a full glass of water. Do not lie down for at least 30 minutes. 20 capsule 0   glucose blood (TRUE METRIX BLOOD GLUCOSE TEST) test strip Use as instructed three times daily before meals 100 each 3   ibuprofen (ADVIL) 800 MG tablet Take 1 tablet (800 mg total) by mouth 3 (three) times daily. (Patient not taking: Reported on 10/10/2023) 21 tablet 0   Insulin  Glargine (BASAGLAR KWIKPEN) 100 UNIT/ML Inject 35 Units into the skin 2 (two) times daily. 15 mL 5   insulin lispro (HUMALOG KWIKPEN) 100 UNIT/ML KwikPen Inject 0-12 Units into the skin 3 (three) times daily.For blood sugars 0-150 give 0 units of insulin, 151-200 give 2 units of insulin, 201-250 give 4 units, 251-300 give 6 units, 301-350 give 8 units, 351-400 give 10 units,> 400 give 12 units 12 mL 6   Insulin Pen Needle 31G X 5 MM MISC Use 4 (four) times daily -  with meals and at bedtime. 100 each 0   Insulin Syringe-Needle U-100 (TRUEPLUS INSULIN SYRINGE) 31G X 5/16" 0.3 ML MISC Use to inject Novolog Three times daily 100  each 2   lisinopril (ZESTRIL) 10 MG tablet Take 1 tablet (10 mg total) by mouth daily. 30 tablet 3   predniSONE (DELTASONE) 20 MG tablet Take 1 tablet (20 mg total) by mouth daily with breakfast. 5 tablet 0   promethazine-dextromethorphan (PROMETHAZINE-DM) 6.25-15 MG/5ML syrup Take 5 mLs by mouth 4 (four) times daily as needed for cough. 118 mL 0   TRUEplus Lancets 28G MISC Use as instructed three times daily before meals 100 each 3   varenicline (CHANTIX) 1 MG tablet Take 1 tablet (1 mg total) by mouth 2 (two) times daily. 60 tablet 1   albuterol (VENTOLIN HFA) 108 (90 Base) MCG/ACT inhaler Inhale 1-2 puffs into the lungs every 6 (six) hours as needed. 6.7 g 0   Continuous Blood Gluc Receiver (FREESTYLE LIBRE 2 READER) DEVI Use as instructed three times daily before meals. E10.65 1 each 0   Continuous Blood Gluc Sensor (FREESTYLE LIBRE 2 SENSOR) MISC Use as instructed three times daily before meals. Change sensors every 14 days. E10.65 2 each 3   fluticasone (FLONASE) 50 MCG/ACT nasal spray Place 2 sprays into both nostrils daily. 16 g 6   fluticasone (FLOVENT HFA) 110 MCG/ACT inhaler Inhale 1 puff into the lungs in the morning and at bedtime. (Patient not taking: Reported on 11/21/2023) 1 each 12   gabapentin (NEURONTIN) 300 MG capsule Take 1 capsule (300 mg total) by mouth 2  (two) times daily. 60 capsule 3   montelukast (SINGULAIR) 10 MG tablet Take 1 tablet (10 mg total) by mouth at bedtime. 30 tablet 1   omeprazole (PRILOSEC) 20 MG capsule TAKE 1 CAPSULE (20 MG TOTAL) BY MOUTH DAILY. 30 capsule 3   No facility-administered medications prior to visit.     ROS Review of Systems  Constitutional:  Negative for activity change and appetite change.  HENT:  Negative for sinus pressure and sore throat.   Respiratory:  Positive for cough. Negative for chest tightness, shortness of breath and wheezing.   Cardiovascular:  Negative for chest pain and palpitations.  Gastrointestinal:  Negative for abdominal distention, abdominal pain and constipation.  Genitourinary: Negative.   Musculoskeletal: Negative.   Psychiatric/Behavioral:  Positive for sleep disturbance. Negative for behavioral problems and dysphoric mood.     Objective:  BP 126/76   Pulse 92   Ht 6\' 3"  (1.905 m)   Wt 260 lb 12.8 oz (118.3 kg)   SpO2 98%   BMI 32.60 kg/m      01/15/2024    1:38 PM 11/21/2023    9:41 AM 11/21/2023    9:39 AM  BP/Weight  Systolic BP 126 140 151  Diastolic BP 76 87 95  Wt. (Lbs) 260.8  256  BMI 32.6 kg/m2  32 kg/m2      Physical Exam Constitutional:      Appearance: He is well-developed.  HENT:     Right Ear: Tympanic membrane normal.     Left Ear: Tympanic membrane normal.     Mouth/Throat:     Mouth: Mucous membranes are moist.  Cardiovascular:     Rate and Rhythm: Normal rate.     Heart sounds: Normal heart sounds. No murmur heard. Pulmonary:     Effort: Pulmonary effort is normal.     Breath sounds: Normal breath sounds. No wheezing or rales.  Chest:     Chest wall: No tenderness.  Abdominal:     General: Bowel sounds are normal. There is no distension.     Palpations: Abdomen is  soft. There is no mass.     Tenderness: There is no abdominal tenderness.  Musculoskeletal:        General: Normal range of motion.     Right lower leg: No edema.      Left lower leg: No edema.  Neurological:     Mental Status: He is alert and oriented to person, place, and time.  Psychiatric:        Mood and Affect: Mood normal.        Latest Ref Rng & Units 07/06/2023    2:35 PM 06/22/2022    9:04 AM 12/14/2020   12:11 PM  CMP  Glucose 70 - 99 mg/dL 782  956  213   BUN 6 - 20 mg/dL 10  8  7    Creatinine 0.76 - 1.27 mg/dL 0.86  5.78  4.69   Sodium 134 - 144 mmol/L 138  140  141   Potassium 3.5 - 5.2 mmol/L 4.1  4.4  4.5   Chloride 96 - 106 mmol/L 98  103  103   CO2 20 - 29 mmol/L 24  22  25    Calcium 8.7 - 10.2 mg/dL 9.5  9.0  9.0   Total Protein 6.0 - 8.5 g/dL 7.2  6.3  7.5   Total Bilirubin 0.0 - 1.2 mg/dL 0.4  0.3  0.4   Alkaline Phos 44 - 121 IU/L 83  70  90   AST 0 - 40 IU/L 18  12  47   ALT 0 - 44 IU/L 16  11  29      Lipid Panel     Component Value Date/Time   CHOL 218 (H) 07/06/2023 1435   TRIG 71 07/06/2023 1435   HDL 68 07/06/2023 1435   CHOLHDL 3.2 07/06/2023 1435   CHOLHDL 4 09/16/2014 1546   VLDL 24.8 09/16/2014 1546   LDLCALC 138 (H) 07/06/2023 1435    CBC    Component Value Date/Time   WBC 7.7 07/06/2023 1435   WBC 6.3 07/03/2017 1538   RBC 5.47 07/06/2023 1435   RBC 5.20 07/03/2017 1538   HGB 14.9 07/06/2023 1435   HCT 45.3 07/06/2023 1435   PLT 214 07/06/2023 1435   MCV 83 07/06/2023 1435   MCH 27.2 07/06/2023 1435   MCH 28.5 07/03/2017 1538   MCHC 32.9 07/06/2023 1435   MCHC 34.6 07/03/2017 1538   RDW 13.9 07/06/2023 1435   LYMPHSABS 2.3 07/06/2023 1435   MONOABS 0.8 04/13/2017 0526   EOSABS 0.2 07/06/2023 1435   BASOSABS 0.0 07/06/2023 1435    Lab Results  Component Value Date   HGBA1C 11.6 (A) 01/15/2024    Assessment & Plan:      Gastroesophageal Reflux Disease (GERD) Experiencing severe acid reflux triggered by certain foods and drinks. Over-the-counter antacids (Tums) not providing relief. -Start omeprazole for acid reflux. -Avoid foods and drinks that trigger reflux. -Do not lie  down immediately after eating.  Chronic Sinusitis Complaints of mucus drainage and coughing, possibly triggering asthma symptoms. Not currently using prescribed Flonase. -Resume use of Flonase nasal spray to help with mucus drainage.  Asthma Increased use of albuterol inhaler due to coughing, possibly triggered by mucus drainage and/or acid reflux. -Resume use of Singulair and Flovent to help manage asthma symptoms.  Type 2 Diabetes Mellitus A1c of 11.6, indicating poor glycemic control. Currently taking 60 units of Basaglar insulin daily, but blood glucose levels remain high. -Increase Basaglar insulin to 35 units twice daily (total of 70 units  daily). -Refer to a nutritionist for dietary management of diabetes. -Schedule follow-up with pharmacist in 1 month to adjust insulin based on blood glucose meter readings. -Order Freestyle Libre glucometer for real-time blood glucose monitoring.  Suspected sleep Apnea Reports of snoring and daytime fatigue, suggesting possible sleep apnea. No previous sleep study conducted. -Order sleep study to evaluate for sleep apnea.  Hyperlipidemia No recent cholesterol check. -Order fasting lipid panel for next week.  Smoking Cessation Reports of reduced smoking frequency while on Chantix (varenicline). -Continue Chantix for smoking cessation.  General Health Maintenance / Followup Plans -Return for blood work next week. -Follow-up appointment in 1 month with pharmacist to adjust insulin regimen based on Freestyle Libre readings.          Meds ordered this encounter  Medications   Continuous Glucose Sensor (FREESTYLE LIBRE 3 SENSOR) MISC    Sig: Place 1 sensor on the skin every 14 days. Use to check glucose continuously    Dispense:  3 each    Refill:  6   montelukast (SINGULAIR) 10 MG tablet    Sig: Take 1 tablet (10 mg total) by mouth at bedtime.    Dispense:  90 tablet    Refill:  1   omeprazole (PRILOSEC) 20 MG capsule    Sig: Take  1 capsule (20 mg total) by mouth daily.    Dispense:  30 capsule    Refill:  3   albuterol (VENTOLIN HFA) 108 (90 Base) MCG/ACT inhaler    Sig: Inhale 1-2 puffs into the lungs every 6 (six) hours as needed.    Dispense:  6.7 g    Refill:  6   fluticasone (FLONASE) 50 MCG/ACT nasal spray    Sig: Place 2 sprays into both nostrils daily.    Dispense:  16 g    Refill:  6   DISCONTD: fluticasone (FLOVENT HFA) 110 MCG/ACT inhaler    Sig: Inhale 1 puff into the lungs in the morning and at bedtime.    Dispense:  12 g    Refill:  12   gabapentin (NEURONTIN) 300 MG capsule    Sig: Take 1 capsule (300 mg total) by mouth 2 (two) times daily.    Dispense:  180 capsule    Refill:  1    Follow-up: Return in about 1 month (around 02/12/2024) for Blood sugar evaluation with Franky Macho, Medical conditions with PCP, in 3 months.       Hoy Register, MD, FAAFP. [facility] [place], [place] [phone]   01/15/2024, 3:18 PM

## 2024-01-24 ENCOUNTER — Other Ambulatory Visit: Payer: Self-pay

## 2024-01-25 ENCOUNTER — Other Ambulatory Visit: Payer: Self-pay

## 2024-01-25 DIAGNOSIS — H6501 Acute serous otitis media, right ear: Secondary | ICD-10-CM | POA: Diagnosis not present

## 2024-01-25 DIAGNOSIS — J392 Other diseases of pharynx: Secondary | ICD-10-CM | POA: Diagnosis not present

## 2024-02-15 ENCOUNTER — Ambulatory Visit: Payer: BC Managed Care – PPO | Admitting: Pharmacist

## 2024-02-15 ENCOUNTER — Other Ambulatory Visit: Payer: Self-pay

## 2024-03-01 ENCOUNTER — Encounter (HOSPITAL_BASED_OUTPATIENT_CLINIC_OR_DEPARTMENT_OTHER): Payer: Self-pay | Admitting: Otolaryngology

## 2024-03-01 ENCOUNTER — Other Ambulatory Visit: Payer: Self-pay

## 2024-03-01 NOTE — Progress Notes (Signed)
   03/01/24 1435  PAT Phone Screen  Is the patient taking a GLP-1 receptor agonist? No  Do You Have Diabetes? Yes  Do You Have Hypertension? Yes  Have You Ever Been to the ER for Asthma? No  Have You Taken Oral Steroids in the Past 3 Months? No  Do you Take Phenteramine or any Other Diet Drugs? No  Recent  Lab Work, EKG, CXR? Yes  Where was this test performed? 01/15/24 a1C 11.6  Do you have a history of heart problems? No  Any Recent Hospitalizations? No  Height 6\' 3"  (1.905 m)  Weight 118 kg  Pat Appointment Scheduled Yes (BMET EKG)   Reviewed A1c with Dr Jean Rosenthal. Will evaluate BMET/glucose at PAT visit and on DOS.

## 2024-03-05 ENCOUNTER — Other Ambulatory Visit: Payer: Self-pay

## 2024-03-05 NOTE — H&P (Signed)
 HPI:   Christopher Douglas is a 33 y.o. male who presents as a new patient.  Christopher Douglas presents today with complaint of of stopped up right ear. He states he had a upper respiratory infection in October. This was treated with 2 courses of antibiotics and more recently a course of prednisone. He states that the ear still feels stopped up. There is no drainage or bleeding. He does smoke.  PMH/Meds/All/SocHx/FamHx/ROS:   Past Medical History:  Diagnosis Date  Diabetes (CMD)  High cholesterol   History reviewed. No pertinent surgical history.  No family history of bleeding disorders, wound healing problems or difficulty with anesthesia.     Current Outpatient Medications:  albuterol HFA (PROVENTIL HFA;VENTOLIN HFA;PROAIR HFA) 90 mcg/actuation inhaler, Inhale 1-2 puffs., Disp: , Rfl:  atorvastatin (LIPITOR) 20 mg tablet, Take 20 mg by mouth daily., Disp: , Rfl:  fluticasone propionate (FLONASE) 50 mcg/spray nasal spray, Administer 2 sprays into affected nostril(s)., Disp: , Rfl:  gabapentin (NEURONTIN) 300 mg capsule, Take 300 mg by mouth., Disp: , Rfl:  insulin glargine (Basaglar KwikPen U-100 Insulin) 100 unit/mL (3 mL) pen, Inject 35 Units under the skin., Disp: , Rfl:  insulin lispro (HumaLOG KwikPen) 100 unit/mL KwikPen, Inject 0-12 Units under the skin., Disp: , Rfl:  lisinopriL (PRINIVIL) 10 mg tablet, Take 10 mg by mouth daily., Disp: , Rfl:  varenicline tartrate (CHANTIX) 1 mg tablet, Take 1 mg by mouth., Disp: , Rfl:  doxycycline (VIBRAMYCIN) 100 mg capsule, Take 100 mg by mouth 2 (two) times a day. (Patient not taking: Reported on 12/07/2023), Disp: 20 capsule, Rfl: 0 valACYclovir (VALTREX) 500 mg tablet, Take 1 tablet by mouth every day for suppression or 1 tab twice daily for 3 days for outbreaks. (Patient not taking: Reported on 12/07/2023), Disp: 30 tablet, Rfl: 2  A complete ROS was performed with pertinent positives/negatives noted in the HPI. The remainder of the ROS are  negative.   Physical Exam:   BP 142/88  Pulse 94  Temp 96.3 F (35.7 C)  Ht 1.905 m (6\' 3" )  Wt 117 kg (257 lb 12.8 oz)  BMI 32.22 kg/m   Constitutional:  Patient appears well-nourished and well-developed. No acute distress.  Head/Face: Facial features are symmetric. Skull is normocephalic. Hair and scalp are normal. Normal temporal artery pulses. TMJ shows no joint deformity swelling or erythema.  Eyes: Pupils are equal, round and reactive to light. Conjunctiva and lids are normal. Normal extraocular mobility. Normal vision by patient report.  Ears:  Right: Pinna and external meatus normal, normal ear canal skin and caliber without excessive cerumen or drainage. Tympanic membranes intact with serous effusion. No infection.  Left: Pinna and external meatus normal, normal ear canal skin and caliber without excessive cerumen or drainage. Tympanic membranes intact without effusion or infection.   Nose/Sinus/Nasopharynx: Septum is normal. Normal nasal mucosa. Normal inferior turbinates.   Oral cavity/Oropharynx: Lips normal, teeth and gums normal with good dentition, normal oral vestibule. Normal floor of mouth, tongue and oral mucosa, no mucosal lesions, ulcer or mass, normal tongue mobility.  Hard and soft palate normal with normal mobility. One plus tonsils, no erythema or exudate. Base of tongue, retromolar trigone and oral pharynx normal. Normal sensation, mobility and gag.  Neck: No cervical lymphadenopathy, mass or swelling. Salivary glands normal to palpation without swelling, erythema or mass. Normal facial nerve function. Normal thyroid gland palpation.  Neurological: Alert and oriented to self, place and time. Normal reflexes and motor skills, balance and coordination.  Psychiatric:  No unusual anxiety or evidence of depression. Appropriate affect.  Independent Review of Additional Tests or Records:  None  Procedures:  Procedure Note -flexible nasal  Endoscopy  Risks/benefits and possible complications of this procedure were discussed in detail and the patient understood and agreed to proceed.  The nose was sprayed with oxymetazoline and 4% lidocaine. With the patient in the upright position, the flexible endscope was inserted into the nasal passage bilaterlly. Where visible, nasal secretions and mucosal crusting were removed with suction. The overall appearance of the nasal cavity and paranasal sinuses were noted and the findings are described below.  Findings: Copious thick discolored mucus on both sides. Fullness of adenoidal pad on the right side  The patient tolerated the procedure without difficulty and was discharged in stable condition.

## 2024-03-07 ENCOUNTER — Encounter (HOSPITAL_BASED_OUTPATIENT_CLINIC_OR_DEPARTMENT_OTHER)
Admission: RE | Admit: 2024-03-07 | Discharge: 2024-03-07 | Disposition: A | Discharge status: Home / Self Care | Source: Ambulatory Visit | Attending: Otolaryngology | Admitting: Otolaryngology

## 2024-03-07 DIAGNOSIS — E109 Type 1 diabetes mellitus without complications: Secondary | ICD-10-CM | POA: Diagnosis not present

## 2024-03-07 DIAGNOSIS — Z794 Long term (current) use of insulin: Secondary | ICD-10-CM | POA: Insufficient documentation

## 2024-03-07 DIAGNOSIS — Z01818 Encounter for other preprocedural examination: Secondary | ICD-10-CM | POA: Diagnosis not present

## 2024-03-07 LAB — BASIC METABOLIC PANEL WITH GFR
Anion gap: 7 (ref 5–15)
BUN: 5 mg/dL — ABNORMAL LOW (ref 6–20)
CO2: 27 mmol/L (ref 22–32)
Calcium: 9.4 mg/dL (ref 8.9–10.3)
Chloride: 104 mmol/L (ref 98–111)
Creatinine, Ser: 1 mg/dL (ref 0.61–1.24)
GFR, Estimated: >60 mL/min (ref >60)
Glucose, Bld: 212 mg/dL — ABNORMAL HIGH (ref 70–99)
Potassium: 4.2 mmol/L (ref 3.5–5.1)
Sodium: 138 mmol/L (ref 135–145)

## 2024-03-11 ENCOUNTER — Encounter (HOSPITAL_BASED_OUTPATIENT_CLINIC_OR_DEPARTMENT_OTHER): Payer: Self-pay | Admitting: Otolaryngology

## 2024-03-11 ENCOUNTER — Ambulatory Visit (HOSPITAL_BASED_OUTPATIENT_CLINIC_OR_DEPARTMENT_OTHER)
Admission: RE | Admit: 2024-03-11 | Discharge: 2024-03-11 | Disposition: A | Discharge status: Home / Self Care | Source: Ambulatory Visit | Attending: Otolaryngology | Admitting: Otolaryngology

## 2024-03-11 ENCOUNTER — Ambulatory Visit (HOSPITAL_BASED_OUTPATIENT_CLINIC_OR_DEPARTMENT_OTHER): Admitting: Anesthesiology

## 2024-03-11 ENCOUNTER — Other Ambulatory Visit: Payer: Self-pay

## 2024-03-11 ENCOUNTER — Encounter (HOSPITAL_BASED_OUTPATIENT_CLINIC_OR_DEPARTMENT_OTHER)
Admission: RE | Disposition: A | Discharge status: Home / Self Care | Payer: Self-pay | Source: Ambulatory Visit | Attending: Otolaryngology

## 2024-03-11 DIAGNOSIS — I10 Essential (primary) hypertension: Secondary | ICD-10-CM | POA: Diagnosis not present

## 2024-03-11 DIAGNOSIS — Z794 Long term (current) use of insulin: Secondary | ICD-10-CM | POA: Diagnosis not present

## 2024-03-11 DIAGNOSIS — J45909 Unspecified asthma, uncomplicated: Secondary | ICD-10-CM | POA: Diagnosis not present

## 2024-03-11 DIAGNOSIS — Z79899 Other long term (current) drug therapy: Secondary | ICD-10-CM | POA: Diagnosis not present

## 2024-03-11 DIAGNOSIS — J00 Acute nasopharyngitis [common cold]: Secondary | ICD-10-CM | POA: Diagnosis not present

## 2024-03-11 DIAGNOSIS — K219 Gastro-esophageal reflux disease without esophagitis: Secondary | ICD-10-CM | POA: Diagnosis not present

## 2024-03-11 DIAGNOSIS — F17210 Nicotine dependence, cigarettes, uncomplicated: Secondary | ICD-10-CM | POA: Diagnosis not present

## 2024-03-11 DIAGNOSIS — J392 Other diseases of pharynx: Secondary | ICD-10-CM | POA: Diagnosis not present

## 2024-03-11 DIAGNOSIS — J3489 Other specified disorders of nose and nasal sinuses: Secondary | ICD-10-CM | POA: Insufficient documentation

## 2024-03-11 DIAGNOSIS — E109 Type 1 diabetes mellitus without complications: Secondary | ICD-10-CM

## 2024-03-11 DIAGNOSIS — E119 Type 2 diabetes mellitus without complications: Secondary | ICD-10-CM | POA: Insufficient documentation

## 2024-03-11 HISTORY — DX: Chronic sinusitis, unspecified: J32.9

## 2024-03-11 HISTORY — DX: Gastro-esophageal reflux disease without esophagitis: K21.9

## 2024-03-11 HISTORY — DX: Other diseases of pharynx: J39.2

## 2024-03-11 HISTORY — PX: NASAL ENDOSCOPY: SHX6577

## 2024-03-11 LAB — GLUCOSE, CAPILLARY
Glucose-Capillary: 177 mg/dL — ABNORMAL HIGH (ref 70–99)
Glucose-Capillary: 208 mg/dL — ABNORMAL HIGH (ref 70–99)

## 2024-03-11 SURGERY — ENDOSCOPY, NOSE
Anesthesia: General | Site: Nose

## 2024-03-11 MED ORDER — DEXMEDETOMIDINE HCL IN NACL 80 MCG/20ML IV SOLN
INTRAVENOUS | Status: DC | PRN
Start: 2024-03-11 — End: 2024-03-11
  Administered 2024-03-11: 12 ug via INTRAVENOUS
  Administered 2024-03-11: 8 ug via INTRAVENOUS

## 2024-03-11 MED ORDER — OXYCODONE HCL 5 MG/5ML PO SOLN: 5.0000 mg | Freq: Once | ORAL | Status: DC | PRN

## 2024-03-11 MED ORDER — DEXAMETHASONE SODIUM PHOSPHATE 4 MG/ML IJ SOLN
INTRAMUSCULAR | Status: DC | PRN
  Administered 2024-03-11: 10 mg via INTRAVENOUS

## 2024-03-11 MED ORDER — ACETAMINOPHEN 500 MG PO TABS
ORAL_TABLET | ORAL | Status: AC
  Filled 2024-03-11: qty 2

## 2024-03-11 MED ORDER — LIDOCAINE-EPINEPHRINE 1 %-1:100000 IJ SOLN
INTRAMUSCULAR | Status: AC
  Filled 2024-03-11: qty 1

## 2024-03-11 MED ORDER — ONDANSETRON HCL 4 MG/2ML IJ SOLN
INTRAMUSCULAR | Status: DC | PRN
  Administered 2024-03-11: 4 mg via INTRAVENOUS

## 2024-03-11 MED ORDER — OXYMETAZOLINE HCL 0.05 % NA SOLN
2.0000 | NASAL | Status: DC
  Administered 2024-03-11: 2 via NASAL

## 2024-03-11 MED ORDER — OXYMETAZOLINE HCL 0.05 % NA SOLN
NASAL | Status: DC | PRN
  Administered 2024-03-11: 1 via TOPICAL

## 2024-03-11 MED ORDER — AMISULPRIDE (ANTIEMETIC) 5 MG/2ML IV SOLN: 10.0000 mg | Freq: Once | INTRAVENOUS | Status: DC | PRN

## 2024-03-11 MED ORDER — ONDANSETRON HCL 4 MG/2ML IJ SOLN: 4.0000 mg | Freq: Once | INTRAMUSCULAR | Status: DC | PRN

## 2024-03-11 MED ORDER — OXYMETAZOLINE HCL 0.05 % NA SOLN
NASAL | Status: AC
  Filled 2024-03-11: qty 30

## 2024-03-11 MED ORDER — MIDAZOLAM HCL 5 MG/5ML IJ SOLN
INTRAMUSCULAR | Status: DC | PRN
  Administered 2024-03-11: 2 mg via INTRAVENOUS

## 2024-03-11 MED ORDER — ONDANSETRON HCL 4 MG/2ML IJ SOLN
INTRAMUSCULAR | Status: AC
  Filled 2024-03-11: qty 2

## 2024-03-11 MED ORDER — MEPERIDINE HCL 25 MG/ML IJ SOLN: 6.2500 mg | INTRAMUSCULAR | Status: DC | PRN

## 2024-03-11 MED ORDER — LIDOCAINE 2% (20 MG/ML) 5 ML SYRINGE
INTRAMUSCULAR | Status: AC
  Filled 2024-03-11: qty 5

## 2024-03-11 MED ORDER — LIDOCAINE HCL (CARDIAC) PF 100 MG/5ML IV SOSY
PREFILLED_SYRINGE | INTRAVENOUS | Status: DC | PRN
  Administered 2024-03-11: 60 mg via INTRAVENOUS

## 2024-03-11 MED ORDER — PROPOFOL 10 MG/ML IV BOLUS
INTRAVENOUS | Status: DC | PRN
  Administered 2024-03-11: 300 mg via INTRAVENOUS

## 2024-03-11 MED ORDER — FENTANYL CITRATE (PF) 100 MCG/2ML IJ SOLN
INTRAMUSCULAR | Status: DC | PRN
  Administered 2024-03-11: 100 ug via INTRAVENOUS

## 2024-03-11 MED ORDER — DEXAMETHASONE SODIUM PHOSPHATE 10 MG/ML IJ SOLN
INTRAMUSCULAR | Status: AC
  Filled 2024-03-11: qty 1

## 2024-03-11 MED ORDER — HYDROMORPHONE HCL 1 MG/ML IJ SOLN
INTRAMUSCULAR | Status: AC
  Filled 2024-03-11: qty 0.5

## 2024-03-11 MED ORDER — OXYCODONE HCL 5 MG PO TABS: 5.0000 mg | ORAL_TABLET | Freq: Once | ORAL | Status: DC | PRN

## 2024-03-11 MED ORDER — SUCCINYLCHOLINE CHLORIDE 200 MG/10ML IV SOSY
PREFILLED_SYRINGE | INTRAVENOUS | Status: DC | PRN
  Administered 2024-03-11: 150 mg via INTRAVENOUS

## 2024-03-11 MED ORDER — FENTANYL CITRATE (PF) 100 MCG/2ML IJ SOLN
INTRAMUSCULAR | Status: AC
  Filled 2024-03-11: qty 2

## 2024-03-11 MED ORDER — PROPOFOL 500 MG/50ML IV EMUL
INTRAVENOUS | Status: AC
  Filled 2024-03-11: qty 50

## 2024-03-11 MED ORDER — MIDAZOLAM HCL 2 MG/2ML IJ SOLN
INTRAMUSCULAR | Status: AC
  Filled 2024-03-11: qty 2

## 2024-03-11 MED ORDER — LACTATED RINGERS IV SOLN: INTRAVENOUS | Status: DC

## 2024-03-11 MED ORDER — LIDOCAINE-EPINEPHRINE 1 %-1:100000 IJ SOLN
INTRAMUSCULAR | Status: DC | PRN
  Administered 2024-03-11: 3 mL

## 2024-03-11 MED ORDER — ACETAMINOPHEN 500 MG PO TABS
1000.0000 mg | ORAL_TABLET | Freq: Once | ORAL | Status: AC
  Administered 2024-03-11: 1000 mg via ORAL

## 2024-03-11 MED ORDER — HYDROMORPHONE HCL 1 MG/ML IJ SOLN
0.2500 mg | INTRAMUSCULAR | Status: DC | PRN
  Administered 2024-03-11: 0.5 mg via INTRAVENOUS

## 2024-03-11 SURGICAL SUPPLY — 32 items
BLADE TRICUT ROTATE M4 4 5PK (BLADE) IMPLANT
CANISTER SUC SOCK COL 7IN (MISCELLANEOUS) IMPLANT
CANISTER SUCT 1200ML W/VALVE (MISCELLANEOUS) ×1 IMPLANT
CORD BIPOLAR FORCEPS 12FT (ELECTRODE) IMPLANT
DEFOGGER MIRROR 1QT (MISCELLANEOUS) ×1 IMPLANT
DRESSING NASAL KENNEDY 3.5X.9 (MISCELLANEOUS) IMPLANT
DRSG CURAD 3X16 NADH (PACKING) IMPLANT
DRSG NASAL KENNEDY LMNT 8CM (GAUZE/BANDAGES/DRESSINGS) IMPLANT
DRSG NASOPORE 8CM (GAUZE/BANDAGES/DRESSINGS) IMPLANT
DRSG TELFA 3X8 NADH STRL (GAUZE/BANDAGES/DRESSINGS) IMPLANT
GAUZE 4X4 16PLY ~~LOC~~+RFID DBL (SPONGE) IMPLANT
GAUZE SPONGE 2X2 STRL 8-PLY (GAUZE/BANDAGES/DRESSINGS) ×1 IMPLANT
GAUZE VASELINE FOILPK 1/2 X 72 (GAUZE/BANDAGES/DRESSINGS) IMPLANT
GLOVE ECLIPSE 7.5 STRL STRAW (GLOVE) ×1 IMPLANT
GOWN STRL REUS W/ TWL LRG LVL3 (GOWN DISPOSABLE) ×2 IMPLANT
HEMOSTAT SURGICEL .5X2 ABSORB (HEMOSTASIS) IMPLANT
IV NS 500ML BAXH (IV SOLUTION) IMPLANT
NDL PRECISIONGLIDE 27X1.5 (NEEDLE) ×1 IMPLANT
NDL SPNL 25GX3.5 QUINCKE BL (NEEDLE) IMPLANT
NEEDLE PRECISIONGLIDE 27X1.5 (NEEDLE) ×1 IMPLANT
NEEDLE SPNL 25GX3.5 QUINCKE BL (NEEDLE) ×1 IMPLANT
NS IRRIG 1000ML POUR BTL (IV SOLUTION) IMPLANT
PACK BASIN DAY SURGERY FS (CUSTOM PROCEDURE TRAY) ×1 IMPLANT
PACK ENT DAY SURGERY (CUSTOM PROCEDURE TRAY) ×1 IMPLANT
PAD MAGNETIC INSTR ST 16X20 (MISCELLANEOUS) IMPLANT
PATTIES SURGICAL .5 X3 (DISPOSABLE) ×1 IMPLANT
SLEEVE SCD COMPRESS KNEE MED (STOCKING) IMPLANT
SPIKE FLUID TRANSFER (MISCELLANEOUS) IMPLANT
SPONGE SURGIFOAM ABS GEL 12-7 (HEMOSTASIS) IMPLANT
TOWEL GREEN STERILE FF (TOWEL DISPOSABLE) ×2 IMPLANT
TUBE CONNECTING 20X1/4 (TUBING) IMPLANT
YANKAUER SUCT BULB TIP NO VENT (SUCTIONS) ×1 IMPLANT

## 2024-03-11 NOTE — Anesthesia Procedure Notes (Signed)
 Procedure Name: Intubation Date/Time: 03/11/2024 9:38 AM  Performed by: Arvilla Birmingham, CRNAPre-anesthesia Checklist: Patient identified, Emergency Drugs available, Suction available and Patient being monitored Patient Re-evaluated:Patient Re-evaluated prior to induction Oxygen  Delivery Method: Circle system utilized Preoxygenation: Pre-oxygenation with 100% oxygen  Induction Type: IV induction Ventilation: Mask ventilation without difficulty Laryngoscope Size: Mac and 4 Grade View: Grade I Tube type: Oral Tube size: 7.5 mm Number of attempts: 1 Airway Equipment and Method: Stylet and Oral airway Placement Confirmation: ETT inserted through vocal cords under direct vision, positive ETCO2 and breath sounds checked- equal and bilateral Secured at: 23 cm Tube secured with: Tape Dental Injury: Teeth and Oropharynx as per pre-operative assessment

## 2024-03-11 NOTE — Transfer of Care (Signed)
 Immediate Anesthesia Transfer of Care Note  Patient: Christopher Douglas  Procedure(s) Performed: ENDOSCOPY, NOSE (Nose)  Patient Location: PACU  Anesthesia Type:General  Level of Consciousness: sedated  Airway & Oxygen  Therapy: Patient Spontanous Breathing and Patient connected to face mask oxygen   Post-op Assessment: Report given to RN and Post -op Vital signs reviewed and stable  Post vital signs: Reviewed and stable  Last Vitals:  Vitals Value Taken Time  BP 111/68 03/11/24 1009  Temp    Pulse 74 03/11/24 1010  Resp 21 03/11/24 1010  SpO2 100 % 03/11/24 1010  Vitals shown include unfiled device data.  Last Pain:  Vitals:   03/11/24 0843  TempSrc: Oral  PainSc: 0-No pain      Patients Stated Pain Goal: 8 (03/11/24 0843)  Complications: No notable events documented.

## 2024-03-11 NOTE — Anesthesia Preprocedure Evaluation (Addendum)
 Anesthesia Evaluation  Patient identified by MRN, date of birth, ID band Patient awake    Reviewed: Allergy & Precautions, NPO status , Patient's Chart, lab work & pertinent test results  Airway Mallampati: III  TM Distance: >3 FB Neck ROM: Full    Dental  (+) Teeth Intact, Dental Advisory Given, Chipped,    Pulmonary asthma , Current Smoker and Patient abstained from smoking. Snores at night, no sleep study 4-5cig/d Albuterol  few times per week in allergy season    Pulmonary exam normal breath sounds clear to auscultation       Cardiovascular hypertension (155/90 preop), Pt. on medications Normal cardiovascular exam Rhythm:Regular Rate:Normal     Neuro/Psych  PSYCHIATRIC DISORDERS Anxiety     negative neurological ROS     GI/Hepatic Neg liver ROS,GERD  Medicated and Controlled,,  Endo/Other  diabetes, Well Controlled, Type 2, Insulin  Dependent  BMI 33 FS 177 this AM  Renal/GU negative Renal ROS  negative genitourinary   Musculoskeletal negative musculoskeletal ROS (+)    Abdominal  (+) + obese  Peds  Hematology negative hematology ROS (+)   Anesthesia Other Findings   Reproductive/Obstetrics negative OB ROS                              Anesthesia Physical Anesthesia Plan  ASA: 2  Anesthesia Plan: General   Post-op Pain Management: Tylenol  PO (pre-op)* and Precedex    Induction: Intravenous  PONV Risk Score and Plan: 2 and Ondansetron , Dexamethasone , Midazolam  and Treatment may vary due to age or medical condition  Airway Management Planned: Oral ETT  Additional Equipment: None  Intra-op Plan:   Post-operative Plan: Extubation in OR  Informed Consent: I have reviewed the patients History and Physical, chart, labs and discussed the procedure including the risks, benefits and alternatives for the proposed anesthesia with the patient or authorized representative who has  indicated his/her understanding and acceptance.     Dental advisory given  Plan Discussed with: CRNA  Anesthesia Plan Comments:         Anesthesia Quick Evaluation

## 2024-03-11 NOTE — Interval H&P Note (Signed)
 History and Physical Interval Note:  03/11/2024 8:51 AM  Christopher Douglas  has presented today for surgery, with the diagnosis of Nasopharyngeal mass Non-recurrent acute serous otitis media of right ear.  The various methods of treatment have been discussed with the patient and family. After consideration of risks, benefits and other options for treatment, the patient has consented to  Procedure(s) with comments: ENDOSCOPY, NOSE (N/A) - NASAL ENDOSCOPY WITH BIOPSY OF NASOPHARYNX as a surgical intervention.  The patient's history has been reviewed, patient examined, no change in status, stable for surgery.  I have reviewed the patient's chart and labs.  Questions were answered to the patient's satisfaction.     [name]

## 2024-03-11 NOTE — Anesthesia Postprocedure Evaluation (Signed)
 Anesthesia Post Note  Patient: Christopher Douglas  Procedure(s) Performed: ENDOSCOPY, NOSE (Nose)     Patient location during evaluation: PACU Anesthesia Type: General Level of consciousness: awake and alert, oriented and patient cooperative Pain management: pain level controlled Vital Signs Assessment: post-procedure vital signs reviewed and stable Respiratory status: spontaneous breathing, nonlabored ventilation and respiratory function stable Cardiovascular status: blood pressure returned to baseline and stable Postop Assessment: no apparent nausea or vomiting Anesthetic complications: no   No notable events documented.  Last Vitals:  Vitals:   03/11/24 1100 03/11/24 1120  BP: (!) 127/90 132/84  Pulse: 72 70  Resp: 14 20  Temp:  36.4 C  SpO2: 95% 96%    Last Pain:  Vitals:   03/11/24 1120  TempSrc: Temporal  PainSc:                  Jacquelyne Matte

## 2024-03-11 NOTE — Discharge Instructions (Addendum)
 Resume diet and activities as tolerated.  Dr. Donalee Fruits will call with biopsy results when available.   Post Anesthesia Home Care Instructions  Activity: Get plenty of rest for the remainder of the day. A responsible individual must stay with you for 24 hours following the procedure.  For the next 24 hours, DO NOT: -Drive a car -Advertising copywriter -Drink alcoholic beverages -Take any medication unless instructed by your physician -Make any legal decisions or sign important papers.  Meals: Start with liquid foods such as gelatin or soup. Progress to regular foods as tolerated. Avoid greasy, spicy, heavy foods. If nausea and/or vomiting occur, drink only clear liquids until the nausea and/or vomiting subsides. Call your physician if vomiting continues.  Special Instructions/Symptoms: Your throat may feel dry or sore from the anesthesia or the breathing tube placed in your throat during surgery. If this causes discomfort, gargle with warm salt water . The discomfort should disappear within 24 hours.

## 2024-03-11 NOTE — Op Note (Signed)
 OPERATIVE REPORT  DATE OF SURGERY: 03/11/2024  PATIENT:  Christopher Douglas,  33 y.o. male  PRE-OPERATIVE DIAGNOSIS:  Nasopharyngeal mass  POST-OPERATIVE DIAGNOSIS:  Nasopharyngeal mass  PROCEDURE:  Procedure(s): ENDOSCOPY, nasal, with biopsy of nasopharyngeal mass  SURGEON:  Shermon Divine, MD  ASSISTANTS: None  ANESTHESIA:   General   EBL: 20 ml  DRAINS: None  LOCAL MEDICATIONS USED: 1% Xylocaine  with epinephrine   SPECIMEN: Nasopharyngeal mass  COUNTS:  Correct  PROCEDURE DETAILS: The patient was taken to the operating room and placed on the operating table in the supine position. Following induction of general endotracheal anesthesia, the nose was draped in standard fashion.  Oxymetazoline  spray was used preoperatively in the nasal cavities.  A 0 degree nasal endoscope was used to visualize the nasopharynx from both sides.  There is a slightly firm mass completely filling the nasopharynx.  Local anesthetic was infiltrated with a spinal needle.  Multiple biopsies were taken from the right side.  These were sent for pathologic evaluation.  Topical Afrin was applied on pledgets.  These were then removed.  The nasopharynx was suctioned.  Patient was awakened extubated and transferred to recovery in stable condition.    PATIENT DISPOSITION:  To PACU, stable

## 2024-03-12 ENCOUNTER — Encounter (HOSPITAL_BASED_OUTPATIENT_CLINIC_OR_DEPARTMENT_OTHER): Payer: Self-pay | Admitting: Otolaryngology

## 2024-03-14 ENCOUNTER — Other Ambulatory Visit: Payer: Self-pay

## 2024-03-14 LAB — SURGICAL PATHOLOGY

## 2024-03-14 MED ORDER — PREDNISONE 10 MG (21) PO TBPK
ORAL_TABLET | ORAL | 0 refills | Status: AC
  Filled 2024-03-14 – 2024-07-04 (×3): qty 21, 6d supply, fill #0

## 2024-03-14 MED ORDER — CLINDAMYCIN HCL 300 MG PO CAPS
300.0000 mg | ORAL_CAPSULE | Freq: Three times a day (TID) | ORAL | 0 refills | Status: AC
  Filled 2024-03-14 – 2024-07-04 (×2): qty 30, 10d supply, fill #0

## 2024-03-15 ENCOUNTER — Other Ambulatory Visit: Payer: Self-pay

## 2024-03-21 DIAGNOSIS — R6889 Other general symptoms and signs: Secondary | ICD-10-CM | POA: Diagnosis not present

## 2024-03-26 ENCOUNTER — Other Ambulatory Visit: Payer: Self-pay

## 2024-03-26 DIAGNOSIS — J392 Other diseases of pharynx: Secondary | ICD-10-CM | POA: Diagnosis not present

## 2024-03-26 DIAGNOSIS — H6501 Acute serous otitis media, right ear: Secondary | ICD-10-CM | POA: Diagnosis not present

## 2024-03-26 MED ORDER — CLINDAMYCIN HCL 300 MG PO CAPS
300.0000 mg | ORAL_CAPSULE | Freq: Three times a day (TID) | ORAL | 0 refills | Status: DC
  Filled 2024-03-26: qty 30, 10d supply, fill #0

## 2024-03-26 MED ORDER — PREDNISONE 10 MG (21) PO TBPK
ORAL_TABLET | ORAL | 0 refills | Status: AC
  Filled 2024-03-26: qty 21, 6d supply, fill #0

## 2024-03-28 ENCOUNTER — Other Ambulatory Visit: Payer: Self-pay

## 2024-04-11 ENCOUNTER — Other Ambulatory Visit: Payer: Self-pay

## 2024-04-12 ENCOUNTER — Telehealth: Payer: Self-pay | Admitting: Family Medicine

## 2024-04-12 NOTE — Telephone Encounter (Addendum)
 Spoke to the patient. Verified name & DOB. Patient was scheduled to see Dr. Adan Holms May 27th, but unfortunately Dr. Newlin will be out of the office that day. Patient requested refills for current medications.

## 2024-04-12 NOTE — Telephone Encounter (Signed)
 Patient was called and he was wanting to make sure he had enough medication to last. After reviewing epic patient has enough medication.

## 2024-04-16 ENCOUNTER — Ambulatory Visit: Payer: BC Managed Care – PPO | Admitting: Family Medicine

## 2024-04-17 ENCOUNTER — Other Ambulatory Visit: Payer: Self-pay

## 2024-04-21 DIAGNOSIS — R6889 Other general symptoms and signs: Secondary | ICD-10-CM | POA: Diagnosis not present

## 2024-04-23 ENCOUNTER — Other Ambulatory Visit: Payer: Self-pay

## 2024-05-03 ENCOUNTER — Other Ambulatory Visit: Payer: Self-pay

## 2024-05-09 ENCOUNTER — Other Ambulatory Visit: Payer: Self-pay

## 2024-05-10 ENCOUNTER — Other Ambulatory Visit: Payer: Self-pay

## 2024-05-14 ENCOUNTER — Other Ambulatory Visit: Payer: Self-pay

## 2024-05-14 DIAGNOSIS — J392 Other diseases of pharynx: Secondary | ICD-10-CM | POA: Diagnosis not present

## 2024-05-14 DIAGNOSIS — H6501 Acute serous otitis media, right ear: Secondary | ICD-10-CM | POA: Diagnosis not present

## 2024-05-14 DIAGNOSIS — J329 Chronic sinusitis, unspecified: Secondary | ICD-10-CM | POA: Diagnosis not present

## 2024-05-28 ENCOUNTER — Telehealth: Payer: Self-pay | Admitting: Family Medicine

## 2024-05-28 NOTE — Telephone Encounter (Signed)
Contacted pt confirmed appt

## 2024-05-30 ENCOUNTER — Other Ambulatory Visit: Payer: Self-pay

## 2024-05-30 ENCOUNTER — Ambulatory Visit: Attending: Family Medicine | Admitting: Family Medicine

## 2024-05-30 ENCOUNTER — Encounter: Payer: Self-pay | Admitting: Family Medicine

## 2024-05-30 ENCOUNTER — Other Ambulatory Visit: Payer: Self-pay | Admitting: Pharmacist

## 2024-05-30 VITALS — BP 129/84 | HR 84 | Resp 19 | Ht 75.0 in | Wt 258.1 lb

## 2024-05-30 DIAGNOSIS — E782 Mixed hyperlipidemia: Secondary | ICD-10-CM

## 2024-05-30 DIAGNOSIS — J4520 Mild intermittent asthma, uncomplicated: Secondary | ICD-10-CM

## 2024-05-30 DIAGNOSIS — E1040 Type 1 diabetes mellitus with diabetic neuropathy, unspecified: Secondary | ICD-10-CM | POA: Diagnosis not present

## 2024-05-30 DIAGNOSIS — F17210 Nicotine dependence, cigarettes, uncomplicated: Secondary | ICD-10-CM | POA: Diagnosis not present

## 2024-05-30 DIAGNOSIS — F17200 Nicotine dependence, unspecified, uncomplicated: Secondary | ICD-10-CM

## 2024-05-30 DIAGNOSIS — E1069 Type 1 diabetes mellitus with other specified complication: Secondary | ICD-10-CM

## 2024-05-30 DIAGNOSIS — E1065 Type 1 diabetes mellitus with hyperglycemia: Secondary | ICD-10-CM

## 2024-05-30 DIAGNOSIS — E1159 Type 2 diabetes mellitus with other circulatory complications: Secondary | ICD-10-CM

## 2024-05-30 DIAGNOSIS — Z794 Long term (current) use of insulin: Secondary | ICD-10-CM

## 2024-05-30 DIAGNOSIS — I10 Essential (primary) hypertension: Secondary | ICD-10-CM

## 2024-05-30 DIAGNOSIS — K2100 Gastro-esophageal reflux disease with esophagitis, without bleeding: Secondary | ICD-10-CM

## 2024-05-30 DIAGNOSIS — E1049 Type 1 diabetes mellitus with other diabetic neurological complication: Secondary | ICD-10-CM

## 2024-05-30 LAB — POCT GLYCOSYLATED HEMOGLOBIN (HGB A1C): Hemoglobin A1C: 10.1 % — AB (ref 4.0–5.6)

## 2024-05-30 MED ORDER — BASAGLAR KWIKPEN 100 UNIT/ML ~~LOC~~ SOPN
PEN_INJECTOR | SUBCUTANEOUS | 5 refills | Status: DC
  Filled 2024-05-30: qty 15, 17d supply, fill #0
  Filled 2024-05-30: qty 15, 18d supply, fill #0

## 2024-05-30 MED ORDER — TOUJEO MAX SOLOSTAR 300 UNIT/ML ~~LOC~~ SOPN
PEN_INJECTOR | SUBCUTANEOUS | 6 refills | Status: AC
  Filled 2024-05-30: qty 9, 30d supply, fill #0
  Filled 2024-07-01: qty 9, 30d supply, fill #1
  Filled 2024-07-31: qty 9, 30d supply, fill #2
  Filled 2024-09-12: qty 9, 30d supply, fill #3
  Filled 2024-10-07: qty 9, 30d supply, fill #4
  Filled 2024-11-11: qty 9, 30d supply, fill #5

## 2024-05-30 NOTE — Progress Notes (Signed)
 Subjective:  Patient ID: Christopher Douglas, male    DOB: 1991/06/13  Age: 33 y.o. MRN: [id number]  CC: Diabetes     Discussed the use of AI scribe software for clinical note transcription with the patient, who gave verbal consent to proceed.  History of Present Illness Christopher Douglas is a 33 year old male with a history of type 1 diabetes mellitus, hypertension, hyperlipidemia, asthma and nicotine dependence  who presents for a follow-up on his blood sugar control.  He experiences fluctuating blood sugar levels, with recent readings from 170 to 270. The lowest reading is around 120, typically in the morning or before lunch. He uses Basaglar , 40 units in the morning and afternoon, and Humalog  on a sliding scale. His A1c has decreased from 11.6 to 10.1.  He has asthma, which is seasonal and stable without recent inhaler use. He takes atorvastatin  for cholesterol and lisinopril  for blood pressure regularly. He also takes gabapentin  for neuropathy but has not picked up omeprazole  for acid reflux recently.  He smokes three to four cigarettes daily, with some abstinent days, and was previously on Chantix . He follows diabetic dietary guidelines with meal boxes. He has not picked up his [place] prescription due to cost but monitors blood sugar by finger pricking.    Past Medical History:  Diagnosis Date   Asthma    Diabetes mellitus without complication (HCC)    GERD (gastroesophageal reflux disease)    Hypertension    Nasopharyngeal mass    Sinusitis     Past Surgical History:  Procedure Laterality Date   FOOT SURGERY     NASAL ENDOSCOPY N/A 03/11/2024   Procedure: ENDOSCOPY, NOSE;  Surgeon: Jesus Oliphant, MD;  Location: [facility];  Service: ENT;  Laterality: N/A;  NASAL ENDOSCOPY WITH BIOPSY OF NASOPHARYNX    Family History  Problem Relation Age of Onset   Diabetes Mellitus II Father    Hypertension Father    Alcohol abuse Sister    Alcohol abuse Brother    Diabetes  Maternal Grandmother    Diabetes Maternal Grandfather    Hypertension Maternal Grandfather    Hypertension Mother     Social History   Socioeconomic History   Marital status: Single    Spouse name: Not on file   Number of children: Not on file   Years of education: Not on file   Highest education level: Not on file  Occupational History   Not on file  Tobacco Use   Smoking status: Every Day    Current packs/day: 0.25    Average packs/day: 0.3 packs/day for 5.0 years (1.3 ttl pk-yrs)    Types: Cigarettes   Smokeless tobacco: Never  Vaping Use   Vaping status: Never Used  Substance and Sexual Activity   Alcohol use: No   Drug use: No   Sexual activity: Yes    Birth control/protection: Condom  Other Topics Concern   Not on file  Social History Narrative   Not on file   Social Drivers of Health   Financial Resource Strain: Low Risk  (10/10/2023)   Overall Financial Resource Strain (CARDIA)    Difficulty of Paying Living Expenses: Not very hard  Food Insecurity: Food Insecurity Present (10/10/2023)   Hunger Vital Sign    Worried About Running Out of Food in the Last Year: Never true    Ran Out of Food in the Last Year: Sometimes true  Transportation Needs: No Transportation Needs (10/10/2023)   PRAPARE -  Administrator, Civil Service (Medical): No    Lack of Transportation (Non-Medical): No  Physical Activity: Sufficiently Active (10/10/2023)   Exercise Vital Sign    Days of Exercise per Week: 3 days    Minutes of Exercise per Session: 60 min  Stress: No Stress Concern Present (10/10/2023)   [employer] of Occupational Health - Occupational Stress Questionnaire    Feeling of Stress : Not at all  Social Connections: Moderately Isolated (10/10/2023)   Social Connection and Isolation Panel    Frequency of Communication with Friends and Family: Twice a week    Frequency of Social Gatherings with Friends and Family: Once a week    Attends Religious  Services: More than 4 times per year    Active Member of [employer] or Organizations: No    Attends Banker Meetings: Never    Marital Status: Never married    Allergies  Allergen Reactions   Amoxicillin  Hives    Did it involve swelling of the face/tongue/throat, SOB, or low BP? Unknown Did it involve sudden or severe rash/hives, skin peeling, or any reaction on the inside of your mouth or nose? Yes Did you need to seek medical attention at a hospital or doctor's office? Unknown When did it last happen? childhood       If all above answers are "NO", may proceed with cephalosporin use.    Shellfish Allergy Hives    Specifically shrimp    Outpatient Medications Prior to Visit  Medication Sig Dispense Refill   albuterol  (VENTOLIN  HFA) 108 (90 Base) MCG/ACT inhaler Inhale 1-2 puffs into the lungs every 6 (six) hours as needed. 6.7 g 6   atorvastatin  (LIPITOR) 20 MG tablet Take 1 tablet (20 mg total) by mouth daily. 30 tablet 3   Blood Glucose Monitoring Suppl (TRUE METRIX METER) w/Device KIT Use as instructed three times daily before meals 1 kit 0   cetirizine  (ZYRTEC ) 10 MG tablet Take 1 tablet (10 mg total) by mouth daily. 30 tablet 1   clindamycin  (CLEOCIN ) 300 MG capsule Take 1 capsule (300 mg total) by mouth 3 (three) times daily for 10 days. 30 capsule 0   Continuous Glucose Sensor (FREESTYLE LIBRE 3 SENSOR) MISC Place 1 sensor on the skin every 14 days. Use to check glucose continuously 3 each 6   doxycycline  (MONODOX ) 100 MG capsule Take 1 capsule (100 mg total) by mouth 2 (two) times daily for 10 days. Take with a full glass of water . Do not lie down for at least 30 minutes. 20 capsule 0   fluticasone  (FLONASE ) 50 MCG/ACT nasal spray Place 2 sprays into both nostrils daily. 16 g 6   Fluticasone  Furoate (ARNUITY ELLIPTA ) 100 MCG/ACT AEPB Inhale 1 Inhalation into the lungs daily. 30 each 2   gabapentin  (NEURONTIN ) 300 MG capsule Take 1 capsule (300 mg total) by mouth 2 (two)  times daily. 180 capsule 1   glucose blood (TRUE METRIX BLOOD GLUCOSE TEST) test strip Use as instructed three times daily before meals 100 each 3   ibuprofen  (ADVIL ) 800 MG tablet Take 1 tablet (800 mg total) by mouth 3 (three) times daily. 21 tablet 0   insulin  lispro (HUMALOG  KWIKPEN) 100 UNIT/ML KwikPen Inject 0-12 Units into the skin 3 (three) times daily.For blood sugars 0-150 give 0 units of insulin , 151-200 give 2 units of insulin , 201-250 give 4 units, 251-300 give 6 units, 301-350 give 8 units, 351-400 give 10 units,> 400 give 12 units 12 mL  6   Insulin  Pen Needle 31G X 5 MM MISC Use 4 (four) times daily -  with meals and at bedtime. 100 each 0   Insulin  Syringe-Needle U-100 (TRUEPLUS INSULIN  SYRINGE) 31G X 5/16 0.3 ML MISC Use to inject Novolog  Three times daily 100 each 2   lisinopril  (ZESTRIL ) 10 MG tablet Take 1 tablet (10 mg total) by mouth daily. 30 tablet 3   omeprazole  (PRILOSEC) 20 MG capsule Take 1 capsule (20 mg total) by mouth daily. 30 capsule 3   promethazine -dextromethorphan (PROMETHAZINE -DM) 6.25-15 MG/5ML syrup Take 5 mLs by mouth 4 (four) times daily as needed for cough. 118 mL 0   TRUEplus Lancets 28G MISC Use as instructed three times daily before meals 100 each 3   varenicline  (CHANTIX ) 1 MG tablet Take 1 tablet (1 mg total) by mouth 2 (two) times daily. 60 tablet 1   Insulin  Glargine (BASAGLAR  KWIKPEN) 100 UNIT/ML Inject 35 Units into the skin 2 (two) times daily. 15 mL 5   montelukast  (SINGULAIR ) 10 MG tablet Take 1 tablet (10 mg total) by mouth at bedtime. (Patient not taking: Reported on 05/30/2024) 90 tablet 1   No facility-administered medications prior to visit.     ROS Review of Systems  Constitutional:  Negative for activity change and appetite change.  HENT:  Negative for sinus pressure and sore throat.   Respiratory:  Negative for chest tightness, shortness of breath and wheezing.   Cardiovascular:  Negative for chest pain and palpitations.   Gastrointestinal:  Negative for abdominal distention, abdominal pain and constipation.  Genitourinary: Negative.   Musculoskeletal: Negative.   Psychiatric/Behavioral:  Negative for behavioral problems and dysphoric mood.     Objective:  BP 129/84 (BP Location: Left Arm, Patient Position: Sitting, Cuff Size: Normal)   Pulse 84   Resp 19   Ht 6' 3 (1.905 m)   Wt 258 lb 1.6 oz (117.1 kg)   SpO2 99%   BMI 32.26 kg/m      05/30/2024    8:49 AM 03/11/2024   11:20 AM 03/11/2024   11:00 AM  BP/Weight  Systolic BP 129 132 127  Diastolic BP 84 84 90  Wt. (Lbs) 258.1    BMI 32.26 kg/m2        Physical Exam Constitutional:      Appearance: He is well-developed.  Cardiovascular:     Rate and Rhythm: Normal rate.     Heart sounds: Normal heart sounds. No murmur heard. Pulmonary:     Effort: Pulmonary effort is normal.     Breath sounds: Normal breath sounds. No wheezing or rales.  Chest:     Chest wall: No tenderness.  Abdominal:     General: Bowel sounds are normal. There is no distension.     Palpations: Abdomen is soft. There is no mass.     Tenderness: There is no abdominal tenderness.  Musculoskeletal:        General: Normal range of motion.     Right lower leg: No edema.     Left lower leg: No edema.  Neurological:     Mental Status: He is alert and oriented to person, place, and time.  Psychiatric:        Mood and Affect: Mood normal.        Latest Ref Rng & Units 03/07/2024   10:39 AM 07/06/2023    2:35 PM 06/22/2022    9:04 AM  CMP  Glucose 70 - 99 mg/dL 787  769  759  BUN 6 - 20 mg/dL 5  10  8    Creatinine 0.61 - 1.24 mg/dL 8.99  8.94  8.90   Sodium 135 - 145 mmol/L 138  138  140   Potassium 3.5 - 5.1 mmol/L 4.2  4.1  4.4   Chloride 98 - 111 mmol/L 104  98  103   CO2 22 - 32 mmol/L 27  24  22    Calcium  8.9 - 10.3 mg/dL 9.4  9.5  9.0   Total Protein 6.0 - 8.5 g/dL  7.2  6.3   Total Bilirubin 0.0 - 1.2 mg/dL  0.4  0.3   Alkaline Phos 44 - 121 IU/L  83   70   AST 0 - 40 IU/L  18  12   ALT 0 - 44 IU/L  16  11     Lipid Panel     Component Value Date/Time   CHOL 218 (H) 07/06/2023 1435   TRIG 71 07/06/2023 1435   HDL 68 07/06/2023 1435   CHOLHDL 3.2 07/06/2023 1435   CHOLHDL 4 09/16/2014 1546   VLDL 24.8 09/16/2014 1546   LDLCALC 138 (H) 07/06/2023 1435    CBC    Component Value Date/Time   WBC 7.7 07/06/2023 1435   WBC 6.3 07/03/2017 1538   RBC 5.47 07/06/2023 1435   RBC 5.20 07/03/2017 1538   HGB 14.9 07/06/2023 1435   HCT 45.3 07/06/2023 1435   PLT 214 07/06/2023 1435   MCV 83 07/06/2023 1435   MCH 27.2 07/06/2023 1435   MCH 28.5 07/03/2017 1538   MCHC 32.9 07/06/2023 1435   MCHC 34.6 07/03/2017 1538   RDW 13.9 07/06/2023 1435   LYMPHSABS 2.3 07/06/2023 1435   MONOABS 0.8 04/13/2017 0526   EOSABS 0.2 07/06/2023 1435   BASOSABS 0.0 07/06/2023 1435    Lab Results  Component Value Date   HGBA1C 10.1 (A) 05/30/2024    Lab Results  Component Value Date   HGBA1C 10.1 (A) 05/30/2024   HGBA1C 11.6 (A) 01/15/2024   HGBA1C 11.1 (A) 10/10/2023       Assessment & Plan Type 1 Diabetes Mellitus Suboptimal glycemic control with A1c at 10.1%. Blood glucose levels range from 120 to 270 mg/dL, with occasional hypoglycemia. Insulin  regimen adjustments needed. - Increase Basaglar  to 44 units AM and 42 units PM. - Continue Humalog  sliding scale. - Encourage diabetic diet and regular exercise. - Perform regular blood glucose monitoring. - Reassess A1c in 3 months.  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 - Continue Gabapentin  300 mg oral twice daily.  Nicotine Dependence Smoking reduced to 3-4 cigarettes/day with periods of abstinence. Continued cessation efforts needed. - Continue Varenicline  (Chantix ) 1 mg oral twice daily. - Encourage continued smoking cessation efforts.  Hyperlipidemia associated with type 1 DM Managed with atorvastatin . Lipid levels need reassessment. - Order  lipid panel.  Hypertension with type 1 DM Well-controlled with Lisinopril . Current BP 129/84 mmHg. - Continue Lisinopril  10 mg oral daily.  Asthma Seasonal asthma well-controlled. - Continue Albuterol  inhaler as needed.  Gastroesophageal Reflux Disease (GERD) Managed with omeprazole . Potential non-adherence noted. - Encourage adherence to omeprazole  20 mg oral daily.  General Health Maintenance Annual eye exams recommended for diabetic retinopathy monitoring. - Refer to ophthalmologist for eye exam.    Meds ordered this encounter  Medications   Insulin  Glargine (BASAGLAR  KWIKPEN) 100 UNIT/ML    Sig: Inject 44 Units into the skin in the morning AND 42 Units every evening.  Dispense:  15 mL    Refill:  5    Follow-up: Return in about 3 months (around 08/30/2024) for Chronic medical conditions.       Corrina Sabin, MD, FAAFP. [facility] [place], [place] [phone]   05/30/2024, 9:36 AM

## 2024-05-30 NOTE — Patient Instructions (Signed)
 VISIT SUMMARY:  Today, you had a follow-up appointment to discuss your blood sugar control and other health concerns. We reviewed your recent blood sugar readings, medication adherence, and lifestyle habits. Adjustments were made to your insulin  regimen, and we discussed the importance of regular monitoring and adherence to your medications.  YOUR PLAN:  -TYPE 1 DIABETES MELLITUS: Type 1 Diabetes Mellitus is a condition where your body does not produce insulin , leading to high blood sugar levels. We have adjusted your Basaglar  insulin  to 44 units in the morning and 42 units in the evening. Please continue using Humalog  on a sliding scale, follow your diabetic diet, exercise regularly, and monitor your blood sugar levels frequently. We will reassess your A1c in 3 months.  -DIABETIC NEUROPATHY: Diabetic Neuropathy is nerve damage caused by high blood sugar levels. Continue taking Gabapentin  300 mg twice daily to manage this condition.  -NICOTINE DEPENDENCE: Nicotine Dependence is an addiction to tobacco products. You have reduced your smoking to 3-4 cigarettes a day. Continue taking Varenicline  (Chantix ) 1 mg twice daily and keep working on quitting smoking.  -HYPERLIPIDEMIA: Hyperlipidemia is having high levels of fats in your blood, which can increase the risk of heart disease. Continue taking Atorvastatin  as prescribed. We will reassess your lipid levels with a blood test.  -HYPERTENSION: Hypertension is high blood pressure. Your blood pressure is well-controlled with Lisinopril . Continue taking Lisinopril  10 mg daily.  -ASTHMA: Asthma is a condition where your airways narrow and swell, making it hard to breathe. Your seasonal asthma is well-controlled. Continue using your Albuterol  inhaler as needed.  -GASTROESOPHAGEAL REFLUX DISEASE (GERD): GERD is a condition where stomach acid frequently flows back into the tube connecting your mouth and stomach. Please make sure to take Omeprazole  20 mg daily  as prescribed to manage this condition.  -GENERAL HEALTH MAINTENANCE: For your overall health, it is important to have annual eye exams to monitor for diabetic retinopathy. We will refer you to an ophthalmologist for an eye exam.  INSTRUCTIONS:  Please follow up in 3 months to reassess your A1c levels. Additionally, get a lipid panel done to check your cholesterol levels. Make sure to schedule an appointment with an ophthalmologist for your annual eye exam.

## 2024-05-31 ENCOUNTER — Ambulatory Visit: Payer: Self-pay | Admitting: Family Medicine

## 2024-05-31 ENCOUNTER — Other Ambulatory Visit: Payer: Self-pay

## 2024-05-31 LAB — LP+NON-HDL CHOLESTEROL
Cholesterol, Total: 189 mg/dL (ref 100–199)
HDL: 76 mg/dL (ref >39)
LDL Chol Calc (NIH): 99 mg/dL (ref 0–99)
Total Non-HDL-Chol (LDL+VLDL): 113 mg/dL (ref 0–129)
Triglycerides: 76 mg/dL (ref 0–149)
VLDL Cholesterol Cal: 14 mg/dL (ref 5–40)

## 2024-06-06 ENCOUNTER — Other Ambulatory Visit: Payer: Self-pay | Admitting: Pharmacist

## 2024-06-06 ENCOUNTER — Other Ambulatory Visit: Payer: Self-pay

## 2024-06-06 MED ORDER — DEXCOM G7 SENSOR MISC
6 refills | Status: AC
  Filled 2024-06-06: qty 3, 30d supply, fill #0

## 2024-06-06 MED ORDER — DEXCOM G7 RECEIVER DEVI
0 refills | Status: AC
  Filled 2024-06-06: qty 1, 90d supply, fill #0

## 2024-06-17 ENCOUNTER — Other Ambulatory Visit: Payer: Self-pay

## 2024-06-18 ENCOUNTER — Other Ambulatory Visit: Payer: Self-pay

## 2024-06-18 ENCOUNTER — Encounter (HOSPITAL_BASED_OUTPATIENT_CLINIC_OR_DEPARTMENT_OTHER): Payer: Self-pay | Admitting: Otolaryngology

## 2024-06-20 ENCOUNTER — Encounter (HOSPITAL_BASED_OUTPATIENT_CLINIC_OR_DEPARTMENT_OTHER)
Admission: RE | Admit: 2024-06-20 | Discharge: 2024-06-20 | Disposition: A | Discharge status: Home / Self Care | Source: Ambulatory Visit | Attending: Otolaryngology | Admitting: Otolaryngology

## 2024-06-20 DIAGNOSIS — Z01812 Encounter for preprocedural laboratory examination: Secondary | ICD-10-CM | POA: Diagnosis not present

## 2024-06-20 DIAGNOSIS — E109 Type 1 diabetes mellitus without complications: Secondary | ICD-10-CM | POA: Insufficient documentation

## 2024-06-20 LAB — BASIC METABOLIC PANEL WITH GFR
Anion gap: 7 (ref 5–15)
BUN: <5 mg/dL — ABNORMAL LOW (ref 6–20)
CO2: 26 mmol/L (ref 22–32)
Calcium: 9.2 mg/dL (ref 8.9–10.3)
Chloride: 104 mmol/L (ref 98–111)
Creatinine, Ser: 1.12 mg/dL (ref 0.61–1.24)
GFR, Estimated: >60 mL/min (ref >60)
Glucose, Bld: 233 mg/dL — ABNORMAL HIGH (ref 70–99)
Potassium: 4.5 mmol/L (ref 3.5–5.1)
Sodium: 137 mmol/L (ref 135–145)

## 2024-06-21 NOTE — H&P (Signed)
 Return visit. The ears doing better. He still has occasional pressure sensation. On exam the left ear is healthy and clear. The right side contains a tube which is clean and dry. Nasal endoscopy reveals persistent nasopharyngeal soft tissue fullness. No obvious worsening and no obstruction. No palpable adenopathy. Recommend repeat CT sinus to evaluate the nasopharyngeal changes. Electronically signed by Ida VEAR Loader, MD at 05/14/2024 3:00 PM EDT    HPI:   Christopher Douglas is a 33 y.o. male who presents as a new patient.  Mr. [name] presents today with complaint of of stopped up right ear. He states he had a upper respiratory infection in October. This was treated with 2 courses of antibiotics and more recently a course of prednisone . He states that the ear still feels stopped up. There is no drainage or bleeding. He does smoke.  PMH/Meds/All/SocHx/FamHx/ROS:   Past Medical History:  Diagnosis Date  Diabetes (CMD)  High cholesterol   History reviewed. No pertinent surgical history.  No family history of bleeding disorders, wound healing problems or difficulty with anesthesia.     Current Outpatient Medications:  albuterol  HFA (PROVENTIL  HFA;VENTOLIN  HFA;PROAIR  HFA) 90 mcg/actuation inhaler, Inhale 1-2 puffs., Disp: , Rfl:  atorvastatin  (LIPITOR) 20 mg tablet, Take 20 mg by mouth daily., Disp: , Rfl:  fluticasone  propionate (FLONASE ) 50 mcg/spray nasal spray, Administer 2 sprays into affected nostril(s)., Disp: , Rfl:  gabapentin  (NEURONTIN ) 300 mg capsule, Take 300 mg by mouth., Disp: , Rfl:  insulin  glargine (Basaglar  KwikPen U-100 Insulin ) 100 unit/mL (3 mL) pen, Inject 35 Units under the skin., Disp: , Rfl:  insulin  lispro (HumaLOG  KwikPen) 100 unit/mL KwikPen, Inject 0-12 Units under the skin., Disp: , Rfl:  lisinopriL  (PRINIVIL ) 10 mg tablet, Take 10 mg by mouth daily., Disp: , Rfl:  varenicline  tartrate (CHANTIX ) 1 mg tablet, Take 1 mg by mouth., Disp: , Rfl:  doxycycline   (VIBRAMYCIN ) 100 mg capsule, Take 100 mg by mouth 2 (two) times a day. (Patient not taking: Reported on 12/07/2023), Disp: 20 capsule, Rfl: 0 valACYclovir (VALTREX) 500 mg tablet, Take 1 tablet by mouth every day for suppression or 1 tab twice daily for 3 days for outbreaks. (Patient not taking: Reported on 12/07/2023), Disp: 30 tablet, Rfl: 2  A complete ROS was performed with pertinent positives/negatives noted in the HPI. The remainder of the ROS are negative.   Physical Exam:   BP 142/88  Pulse 94  Temp 96.3 F (35.7 C)  Ht 1.905 m (6' 3)  Wt 117 kg (257 lb 12.8 oz)  BMI 32.22 kg/m   Constitutional:  Patient appears well-nourished and well-developed. No acute distress.  Head/Face: Facial features are symmetric. Skull is normocephalic. Hair and scalp are normal. Normal temporal artery pulses. TMJ shows no joint deformity swelling or erythema.  Eyes: Pupils are equal, round and reactive to light. Conjunctiva and lids are normal. Normal extraocular mobility. Normal vision by patient report.  Ears:  Right: Pinna and external meatus normal, normal ear canal skin and caliber without excessive cerumen or drainage. Tympanic membranes intact with serous effusion. No infection.  Left: Pinna and external meatus normal, normal ear canal skin and caliber without excessive cerumen or drainage. Tympanic membranes intact without effusion or infection.   Nose/Sinus/Nasopharynx: Septum is normal. Normal nasal mucosa. Normal inferior turbinates.   Oral cavity/Oropharynx: Lips normal, teeth and gums normal with good dentition, normal oral vestibule. Normal floor of mouth, tongue and oral mucosa, no mucosal lesions, ulcer or mass, normal tongue mobility.  Hard and  soft palate normal with normal mobility. One plus tonsils, no erythema or exudate. Base of tongue, retromolar trigone and oral pharynx normal. Normal sensation, mobility and gag.  Neck: No cervical lymphadenopathy, mass or  swelling. Salivary glands normal to palpation without swelling, erythema or mass. Normal facial nerve function. Normal thyroid gland palpation.  Neurological: Alert and oriented to self, place and time. Normal reflexes and motor skills, balance and coordination.  Psychiatric: No unusual anxiety or evidence of depression. Appropriate affect.  Independent Review of Additional Tests or Records:  None  Procedures:  Procedure Note -flexible nasal Endoscopy  Risks/benefits and possible complications of this procedure were discussed in detail and the patient understood and agreed to proceed.  The nose was sprayed with oxymetazoline  and 4% lidocaine . With the patient in the upright position, the flexible endscope was inserted into the nasal passage bilaterlly. Where visible, nasal secretions and mucosal crusting were removed with suction. The overall appearance of the nasal cavity and paranasal sinuses were noted and the findings are described below.  Findings: Copious thick discolored mucus on both sides. Fullness of adenoidal pad on the right side  The patient tolerated the procedure without difficulty and was discharged in stable condition.  Alm RAMAN. [place]hour, PA-C [facility]  Impression & Plans:   1) serous otitis media-right ear 2) sinusitis

## 2024-06-24 ENCOUNTER — Ambulatory Visit (HOSPITAL_BASED_OUTPATIENT_CLINIC_OR_DEPARTMENT_OTHER): Admitting: Anesthesiology

## 2024-06-24 ENCOUNTER — Ambulatory Visit (HOSPITAL_BASED_OUTPATIENT_CLINIC_OR_DEPARTMENT_OTHER)
Admission: RE | Admit: 2024-06-24 | Discharge: 2024-06-24 | Disposition: A | Discharge status: Home / Self Care | Attending: Otolaryngology | Admitting: Otolaryngology

## 2024-06-24 ENCOUNTER — Other Ambulatory Visit: Payer: Self-pay

## 2024-06-24 ENCOUNTER — Encounter (HOSPITAL_BASED_OUTPATIENT_CLINIC_OR_DEPARTMENT_OTHER): Payer: Self-pay | Admitting: Otolaryngology

## 2024-06-24 ENCOUNTER — Encounter (HOSPITAL_BASED_OUTPATIENT_CLINIC_OR_DEPARTMENT_OTHER)
Admission: RE | Disposition: A | Discharge status: Home / Self Care | Payer: Self-pay | Source: Home / Self Care | Attending: Otolaryngology

## 2024-06-24 DIAGNOSIS — Z794 Long term (current) use of insulin: Secondary | ICD-10-CM | POA: Insufficient documentation

## 2024-06-24 DIAGNOSIS — J45909 Unspecified asthma, uncomplicated: Secondary | ICD-10-CM | POA: Diagnosis not present

## 2024-06-24 DIAGNOSIS — F17200 Nicotine dependence, unspecified, uncomplicated: Secondary | ICD-10-CM | POA: Diagnosis not present

## 2024-06-24 DIAGNOSIS — Z79899 Other long term (current) drug therapy: Secondary | ICD-10-CM | POA: Diagnosis not present

## 2024-06-24 DIAGNOSIS — J392 Other diseases of pharynx: Secondary | ICD-10-CM | POA: Diagnosis not present

## 2024-06-24 DIAGNOSIS — E109 Type 1 diabetes mellitus without complications: Secondary | ICD-10-CM | POA: Insufficient documentation

## 2024-06-24 DIAGNOSIS — H6501 Acute serous otitis media, right ear: Secondary | ICD-10-CM | POA: Insufficient documentation

## 2024-06-24 DIAGNOSIS — J329 Chronic sinusitis, unspecified: Secondary | ICD-10-CM | POA: Insufficient documentation

## 2024-06-24 DIAGNOSIS — I10 Essential (primary) hypertension: Secondary | ICD-10-CM | POA: Diagnosis not present

## 2024-06-24 DIAGNOSIS — J3489 Other specified disorders of nose and nasal sinuses: Secondary | ICD-10-CM | POA: Insufficient documentation

## 2024-06-24 DIAGNOSIS — K219 Gastro-esophageal reflux disease without esophagitis: Secondary | ICD-10-CM | POA: Diagnosis not present

## 2024-06-24 HISTORY — PX: ADENOIDECTOMY: SHX5191

## 2024-06-24 LAB — GLUCOSE, CAPILLARY
Glucose-Capillary: 112 mg/dL — ABNORMAL HIGH (ref 70–99)
Glucose-Capillary: 96 mg/dL (ref 70–99)

## 2024-06-24 SURGERY — ADENOIDECTOMY
Anesthesia: General | Site: Mouth | Laterality: Bilateral

## 2024-06-24 MED ORDER — SUGAMMADEX SODIUM 200 MG/2ML IV SOLN
INTRAVENOUS | Status: DC | PRN
  Administered 2024-06-24: 400 mg via INTRAVENOUS

## 2024-06-24 MED ORDER — LIDOCAINE HCL (CARDIAC) PF 100 MG/5ML IV SOSY
PREFILLED_SYRINGE | INTRAVENOUS | Status: DC | PRN
  Administered 2024-06-24: 100 mg via INTRAVENOUS

## 2024-06-24 MED ORDER — ONDANSETRON HCL 4 MG/2ML IJ SOLN
INTRAMUSCULAR | Status: AC
  Filled 2024-06-24: qty 2

## 2024-06-24 MED ORDER — MIDAZOLAM HCL 2 MG/2ML IJ SOLN
INTRAMUSCULAR | Status: AC
  Filled 2024-06-24: qty 2

## 2024-06-24 MED ORDER — DEXMEDETOMIDINE HCL IN NACL 80 MCG/20ML IV SOLN
INTRAVENOUS | Status: DC | PRN
Start: 2024-06-24 — End: 2024-06-24
  Administered 2024-06-24 (×2): 8 ug via INTRAVENOUS
  Administered 2024-06-24: 4 ug via INTRAVENOUS

## 2024-06-24 MED ORDER — ROCURONIUM BROMIDE 100 MG/10ML IV SOLN
INTRAVENOUS | Status: DC | PRN
  Administered 2024-06-24: 50 mg via INTRAVENOUS

## 2024-06-24 MED ORDER — PROPOFOL 10 MG/ML IV BOLUS
INTRAVENOUS | Status: DC | PRN
  Administered 2024-06-24: 200 mg via INTRAVENOUS

## 2024-06-24 MED ORDER — DEXAMETHASONE SODIUM PHOSPHATE 4 MG/ML IJ SOLN
INTRAMUSCULAR | Status: DC | PRN
  Administered 2024-06-24: 10 mg via INTRAVENOUS

## 2024-06-24 MED ORDER — MIDAZOLAM HCL 5 MG/5ML IJ SOLN
INTRAMUSCULAR | Status: DC | PRN
  Administered 2024-06-24: 2 mg via INTRAVENOUS

## 2024-06-24 MED ORDER — OXYCODONE HCL 5 MG/5ML PO SOLN: 5.0000 mg | Freq: Once | ORAL | Status: DC | PRN

## 2024-06-24 MED ORDER — FENTANYL CITRATE (PF) 100 MCG/2ML IJ SOLN
INTRAMUSCULAR | Status: DC | PRN
  Administered 2024-06-24: 100 ug via INTRAVENOUS

## 2024-06-24 MED ORDER — FENTANYL CITRATE (PF) 100 MCG/2ML IJ SOLN
INTRAMUSCULAR | Status: AC
Start: 2024-06-24 — End: 2024-06-24
  Filled 2024-06-24: qty 2

## 2024-06-24 MED ORDER — ACETAMINOPHEN 500 MG PO TABS
ORAL_TABLET | ORAL | Status: AC
Start: 2024-06-24 — End: 2024-06-24
  Filled 2024-06-24: qty 2

## 2024-06-24 MED ORDER — DROPERIDOL 2.5 MG/ML IJ SOLN: 0.6250 mg | Freq: Once | INTRAMUSCULAR | Status: DC | PRN

## 2024-06-24 MED ORDER — FENTANYL CITRATE (PF) 100 MCG/2ML IJ SOLN: 25.0000 ug | INTRAMUSCULAR | Status: DC | PRN

## 2024-06-24 MED ORDER — LACTATED RINGERS IV SOLN: INTRAVENOUS | Status: DC

## 2024-06-24 MED ORDER — DEXAMETHASONE SODIUM PHOSPHATE 10 MG/ML IJ SOLN
INTRAMUSCULAR | Status: AC
  Filled 2024-06-24: qty 1

## 2024-06-24 MED ORDER — OXYCODONE HCL 5 MG PO TABS: 5.0000 mg | ORAL_TABLET | Freq: Once | ORAL | Status: DC | PRN

## 2024-06-24 MED ORDER — ONDANSETRON HCL 4 MG/2ML IJ SOLN
INTRAMUSCULAR | Status: DC | PRN
  Administered 2024-06-24: 4 mg via INTRAVENOUS

## 2024-06-24 MED ORDER — ACETAMINOPHEN 500 MG PO TABS
1000.0000 mg | ORAL_TABLET | Freq: Once | ORAL | Status: AC
  Administered 2024-06-24: 1000 mg via ORAL

## 2024-06-24 SURGICAL SUPPLY — 21 items
CANISTER SUCT 1200ML W/VALVE (MISCELLANEOUS) ×1 IMPLANT
CATH ROBINSON RED A/P 12FR (CATHETERS) ×1 IMPLANT
COAGULATOR SUCT SWTCH 10FR 6 (ELECTROSURGICAL) ×1 IMPLANT
COVER BACK TABLE 60X90IN (DRAPES) ×1 IMPLANT
COVER MAYO STAND STRL (DRAPES) ×1 IMPLANT
DEFOGGER MIRROR 1QT (MISCELLANEOUS) ×1 IMPLANT
ELECTRODE REM PT RETRN 9FT PED (ELECTROSURGICAL) IMPLANT
ELECTRODE REM PT RTRN 9FT ADLT (ELECTROSURGICAL) IMPLANT
GAUZE SPONGE 4X4 12PLY STRL LF (GAUZE/BANDAGES/DRESSINGS) ×1 IMPLANT
GLOVE ECLIPSE 7.5 STRL STRAW (GLOVE) ×1 IMPLANT
GOWN STRL REUS W/ TWL LRG LVL3 (GOWN DISPOSABLE) ×2 IMPLANT
MARKER SKIN DUAL TIP RULER LAB (MISCELLANEOUS) IMPLANT
NS IRRIG 1000ML POUR BTL (IV SOLUTION) ×1 IMPLANT
SHEET MEDIUM DRAPE 40X70 STRL (DRAPES) ×1 IMPLANT
SPONGE TONSIL 1 RF SGL (DISPOSABLE) IMPLANT
SPONGE TONSIL 1.25 RF SGL STRG (GAUZE/BANDAGES/DRESSINGS) IMPLANT
SYR BULB EAR ULCER 3OZ GRN STR (SYRINGE) ×1 IMPLANT
TOWEL GREEN STERILE FF (TOWEL DISPOSABLE) ×1 IMPLANT
TUBE CONNECTING 20X1/4 (TUBING) ×1 IMPLANT
TUBE SALEM SUMP 12FR 48 (TUBING) IMPLANT
TUBE SALEM SUMP 16F (TUBING) IMPLANT

## 2024-06-24 NOTE — Interval H&P Note (Signed)
 History and Physical Interval Note:  06/24/2024 10:03 AM  Christopher Douglas  has presented today for surgery, with the diagnosis of Non-recurrent acute serous otitis media of right ear Nasopharyngeal mass.  The various methods of treatment have been discussed with the patient and family. After consideration of risks, benefits and other options for treatment, the patient has consented to  Procedure(s): ADENOIDECTOMY (Bilateral) as a surgical intervention.  The patient's history has been reviewed, patient examined, no change in status, stable for surgery.  I have reviewed the patient's chart and labs.  Questions were answered to the patient's satisfaction.     [name]

## 2024-06-24 NOTE — Anesthesia Postprocedure Evaluation (Signed)
 Anesthesia Post Note  Patient: Christopher Douglas  Procedure(s) Performed: ADENOIDECTOMY (Bilateral: Mouth)     Patient location during evaluation: PACU Anesthesia Type: General Level of consciousness: awake and alert Pain management: pain level controlled Vital Signs Assessment: post-procedure vital signs reviewed and stable Respiratory status: spontaneous breathing, nonlabored ventilation and respiratory function stable Cardiovascular status: blood pressure returned to baseline Postop Assessment: no apparent nausea or vomiting Anesthetic complications: no   No notable events documented.  Last Vitals:  Vitals:   06/24/24 1115 06/24/24 1130  BP: 130/80 130/87  Pulse: 81 80  Resp: 18 17  Temp:    SpO2: 100% 96%    Last Pain:  Vitals:   06/24/24 1130  TempSrc:   PainSc: 0-No pain                 Vertell Row

## 2024-06-24 NOTE — Anesthesia Preprocedure Evaluation (Addendum)
 Anesthesia Evaluation  Patient identified by MRN, date of birth, ID band Patient awake    Reviewed: Allergy & Precautions, NPO status , Patient's Chart, lab work & pertinent test results  History of Anesthesia Complications Negative for: history of anesthetic complications  Airway Mallampati: II  TM Distance: >3 FB Neck ROM: Full    Dental no notable dental hx.    Pulmonary asthma , Current Smoker and Patient abstained from smoking.   Pulmonary exam normal        Cardiovascular hypertension, Pt. on medications Normal cardiovascular exam     Neuro/Psych negative neurological ROS     GI/Hepatic Neg liver ROS,GERD  Medicated,,  Endo/Other  diabetes, Type 1, Insulin  Dependent    Renal/GU negative Renal ROS     Musculoskeletal negative musculoskeletal ROS (+)    Abdominal   Peds  Hematology negative hematology ROS (+)   Anesthesia Other Findings Day of surgery medications reviewed with patient.  Reproductive/Obstetrics                              Anesthesia Physical Anesthesia Plan  ASA: 2  Anesthesia Plan: General   Post-op Pain Management: Tylenol  PO (pre-op)*   Induction: Intravenous  PONV Risk Score and Plan: 2 and Treatment may vary due to age or medical condition, Ondansetron , Dexamethasone  and Midazolam   Airway Management Planned: Oral ETT  Additional Equipment: None  Intra-op Plan:   Post-operative Plan: Extubation in OR  Informed Consent: I have reviewed the patients History and Physical, chart, labs and discussed the procedure including the risks, benefits and alternatives for the proposed anesthesia with the patient or authorized representative who has indicated his/her understanding and acceptance.     Dental advisory given  Plan Discussed with: CRNA  Anesthesia Plan Comments:          Anesthesia Quick Evaluation

## 2024-06-24 NOTE — Transfer of Care (Signed)
 Immediate Anesthesia Transfer of Care Note  Patient: Christopher Douglas  Procedure(s) Performed: ADENOIDECTOMY (Bilateral: Mouth)  Patient Location: PACU  Anesthesia Type:General  Level of Consciousness: awake, alert , and oriented  Airway & Oxygen  Therapy: Patient Spontanous Breathing and Patient connected to face mask oxygen   Post-op Assessment: Report given to RN and Post -op Vital signs reviewed and stable  Post vital signs: Reviewed and stable  Last Vitals:  Vitals Value Taken Time  BP 135/83 06/24/24 11:11  Temp 36.8 C 06/24/24 11:11  Pulse 80 06/24/24 11:12  Resp 20 06/24/24 11:12  SpO2 100 % 06/24/24 11:12  Vitals shown include unfiled device data.  Last Pain:  Vitals:   06/24/24 1111  TempSrc:   PainSc: 0-No pain      Patients Stated Pain Goal: 3 (06/24/24 0950)  Complications: No notable events documented.

## 2024-06-24 NOTE — Discharge Instructions (Addendum)
 Use tylenol  and/or motrin  as needed. Resume diet and activities as tolerated. Post Anesthesia Home Care Instructions  Activity: Get plenty of rest for the remainder of the day. A responsible individual must stay with you for 24 hours following the procedure.  For the next 24 hours, DO NOT: -Drive a car -Advertising copywriter -Drink alcoholic beverages -Take any medication unless instructed by your physician -Make any legal decisions or sign important papers.  Meals: Start with liquid foods such as gelatin or soup. Progress to regular foods as tolerated. Avoid greasy, spicy, heavy foods. If nausea and/or vomiting occur, drink only clear liquids until the nausea and/or vomiting subsides. Call your physician if vomiting continues.  Special Instructions/Symptoms: Your throat may feel dry or sore from the anesthesia or the breathing tube placed in your throat during surgery. If this causes discomfort, gargle with warm salt water . The discomfort should disappear within 24 hours.  If you had a scopolamine patch placed behind your ear for the management of post- operative nausea and/or vomiting:  1. The medication in the patch is effective for 72 hours, after which it should be removed.  Wrap patch in a tissue and discard in the trash. Wash hands thoroughly with soap and water . 2. You may remove the patch earlier than 72 hours if you experience unpleasant side effects which may include dry mouth, dizziness or visual disturbances. 3. Avoid touching the patch. Wash your hands with soap and water  after contact with the patch.       Next dose of Tylenol  may be taken at 4p

## 2024-06-24 NOTE — Anesthesia Procedure Notes (Signed)
 Procedure Name: Intubation Date/Time: 06/24/2024 10:41 AM  Performed by: Julieanne Fairy BROCKS, CRNAPre-anesthesia Checklist: Patient identified, Emergency Drugs available, Suction available and Patient being monitored Patient Re-evaluated:Patient Re-evaluated prior to induction Oxygen  Delivery Method: Circle system utilized Preoxygenation: Pre-oxygenation with 100% oxygen  Induction Type: IV induction Ventilation: Mask ventilation without difficulty Laryngoscope Size: Mac and 4 Grade View: Grade II Tube type: Oral Tube size: 7.5 mm Number of attempts: 1 Airway Equipment and Method: Stylet and Oral airway Placement Confirmation: ETT inserted through vocal cords under direct vision, positive ETCO2 and breath sounds checked- equal and bilateral Secured at: 23 cm Tube secured with: Tape Dental Injury: Teeth and Oropharynx as per pre-operative assessment

## 2024-06-24 NOTE — Op Note (Signed)
 06/24/2024  10:57 AM  PATIENT:  Christopher Douglas  33 y.o. male  PRE-OPERATIVE DIAGNOSIS:  Non-recurrent acute serous otitis media of right ear Nasopharyngeal mass  POST-OPERATIVE DIAGNOSIS:  Non-recurrent acute serous otitis media of right earNasopharyngeal mass  PROCEDURE:  Procedure(s): ADENOIDECTOMY  SURGEON:  Surgeon(s): Jesus Oliphant, MD  ANESTHESIA:   General  COUNTS:  Correct   DICTATION: The patient was taken to the operating room and placed on the operating table in the supine position. Following induction of general endotracheal anesthesia, the table was turned and the patient was draped in a standard fashion. A Crowe-Davis mouthgag was inserted into the oral cavity and used to retract the tongue and mandible, then attached to the Mayo stand. Indirect exam of the nasopharynx reveals  moderately sized adenoid . Adenoidectomy was performed using suction cautery to ablate the lymphoid tissue in the nasopharynx. The adenoidal tissue was ablated down to the level of the nasopharyngeal mucosa. There was no specimen and minimal bleeding.  The pharynx was irrigated with saline and suctioned. An oral gastric tube was used to aspirate the contents of the stomach. The patient was then awakened from anesthesia and transferred to PACU in stable condition.   PATIENT DISPOSITION:  To PACU, stable

## 2024-06-25 ENCOUNTER — Encounter (HOSPITAL_BASED_OUTPATIENT_CLINIC_OR_DEPARTMENT_OTHER): Payer: Self-pay | Admitting: Otolaryngology

## 2024-07-04 ENCOUNTER — Other Ambulatory Visit: Payer: Self-pay

## 2024-07-09 DIAGNOSIS — H6991 Unspecified Eustachian tube disorder, right ear: Secondary | ICD-10-CM | POA: Diagnosis not present

## 2024-07-10 ENCOUNTER — Other Ambulatory Visit: Payer: Self-pay

## 2024-07-31 ENCOUNTER — Other Ambulatory Visit: Payer: Self-pay

## 2024-07-31 ENCOUNTER — Ambulatory Visit: Payer: Self-pay

## 2024-07-31 ENCOUNTER — Encounter: Payer: Self-pay | Admitting: Nurse Practitioner

## 2024-07-31 ENCOUNTER — Ambulatory Visit (INDEPENDENT_AMBULATORY_CARE_PROVIDER_SITE_OTHER): Admitting: Nurse Practitioner

## 2024-07-31 VITALS — BP 128/80 | HR 79 | Temp 97.2°F | Ht 75.0 in | Wt 265.0 lb

## 2024-07-31 DIAGNOSIS — H6501 Acute serous otitis media, right ear: Secondary | ICD-10-CM | POA: Insufficient documentation

## 2024-07-31 DIAGNOSIS — F17200 Nicotine dependence, unspecified, uncomplicated: Secondary | ICD-10-CM | POA: Diagnosis not present

## 2024-07-31 MED ORDER — CLINDAMYCIN HCL 300 MG PO CAPS
300.0000 mg | ORAL_CAPSULE | Freq: Three times a day (TID) | ORAL | 0 refills | Status: AC
  Filled 2024-07-31: qty 30, 10d supply, fill #0

## 2024-07-31 NOTE — Assessment & Plan Note (Signed)
  Current use with acknowledgment of need to quit due to health impact. - Discuss smoking cessation options, including nicotine replacement therap

## 2024-07-31 NOTE — Assessment & Plan Note (Signed)
 Has Eustachian tube dysfunction with right ear pressure and hearing loss Chronic dysfunction with recent exacerbation. History of tube placement. . Some improvement with previous steroids and antibiotics. - Prescribe oral antibiotics. - Recommend acetaminophen  or ibuprofen  for pain. - Instruct to avoid cotton swabs. Follow-up with ENT if no improvement after completion of antibiotics Advised cetirizine  10 mg daily  - Continue fluticasone  nasal spray daily. No steroid prescribed due to uncontrolled type 1 diabetes  y.

## 2024-07-31 NOTE — Progress Notes (Signed)
 Acute Office Visit  Subjective:     Patient ID: Christopher Douglas, male    DOB: [birth year], 33 y.o.   MRN: [id number]  Chief Complaint  Patient presents with   Sinus Problem    For about two weeks . Pt is being seen by ENT already. Next appt is in nov. Per pt    Discussed the use of AI scribe software for clinical note transcription with the patient, who gave verbal consent to proceed.  History of Present Illness Christopher Douglas is a 33 year old male  has a past medical history of Asthma, Diabetes mellitus without complication (HCC), GERD (gastroesophageal reflux disease), Hypertension, Nasopharyngeal mass, and Sinusitis. who presents with ear pressure and headaches.  He has been experiencing ear pressure and headaches for the past week. The pressure is described as a 'U shape' from his sinus to his right ear. He had tubes placed in his right ear around April or May, which initially resolved his symptoms. However, the pressure and headaches have returned, causing significant discomfort.  He experiences sinus strain and some drainage, leading to occasional coughing. No sneezing, runny nose, or shortness of breath. He experiences a 'split second' of relief when the ear feels open, but it quickly closes back up, causing hearing difficulties. These hearing issues have been present since the symptoms began.  He has been using over-the-counter medications like ibuprofen  for symptom relief. He recalls being treated with antibiotics and steroids by his ENT in the past, which provided significant relief.  He is currently taking Zyrtec  as needed, Flonase  nasal spray daily,. He also smokes.  Assessment & Plan   Review of Systems  Constitutional:  Negative for appetite change, chills, fatigue and fever.  HENT:  Positive for ear discharge and ear pain. Negative for congestion.   Respiratory:  Negative for cough, shortness of breath and wheezing.   Cardiovascular:  Negative for chest pain,  palpitations and leg swelling.  Gastrointestinal:  Negative for abdominal pain, constipation, nausea and vomiting.  Genitourinary:  Negative for difficulty urinating, dysuria, flank pain and frequency.  Musculoskeletal:  Negative for arthralgias, back pain, joint swelling and myalgias.  Skin:  Negative for color change, pallor, rash and wound.  Neurological:  Positive for headaches. Negative for facial asymmetry, weakness and numbness.  Psychiatric/Behavioral:  Negative for behavioral problems, confusion, self-injury and suicidal ideas.         Objective:    BP 128/80   Pulse 79   Temp (!) 97.2 F (36.2 C)   Ht 6' 3 (1.905 m)   Wt 265 lb (120.2 kg)   SpO2 100%   BMI 33.12 kg/m    Physical Exam Vitals reviewed.  Constitutional:      General: He is not in acute distress.    Appearance: Normal appearance. He is not ill-appearing, toxic-appearing or diaphoretic.  HENT:     Right Ear: Ear canal and external ear normal. There is no impacted cerumen.     Left Ear: Tympanic membrane, ear canal and external ear normal. There is no impacted cerumen.     Ears:     Comments: Tube in place on the right TM     Nose: No congestion or rhinorrhea.     Mouth/Throat:     Mouth: Mucous membranes are moist.     Pharynx: Oropharynx is clear. No oropharyngeal exudate or posterior oropharyngeal erythema.  Eyes:     General: No scleral icterus.       Right eye:  No discharge.        Left eye: No discharge.     Extraocular Movements: Extraocular movements intact.     Conjunctiva/sclera: Conjunctivae normal.  Cardiovascular:     Rate and Rhythm: Normal rate and regular rhythm.     Pulses: Normal pulses.     Heart sounds: Normal heart sounds. No murmur heard. Pulmonary:     Effort: Pulmonary effort is normal. No respiratory distress.     Breath sounds: Normal breath sounds. No stridor. No wheezing, rhonchi or rales.  Chest:     Chest wall: No tenderness.  Abdominal:     General: There is no  distension.     Palpations: Abdomen is soft.     Tenderness: There is no abdominal tenderness. There is no guarding.  Musculoskeletal:        General: No tenderness.     Cervical back: Normal range of motion and neck supple. No rigidity or tenderness.     Right lower leg: No edema.     Left lower leg: No edema.  Lymphadenopathy:     Cervical: No cervical adenopathy.  Skin:    General: Skin is warm and dry.     Capillary Refill: Capillary refill takes less than 2 seconds.  Neurological:     Mental Status: He is alert and oriented to person, place, and time.     Motor: No weakness.     Coordination: Coordination normal.     Gait: Gait normal.  Psychiatric:        Mood and Affect: Mood normal.        Behavior: Behavior normal.        Thought Content: Thought content normal.        Judgment: Judgment normal.     No results found for any visits on 07/31/24.      Assessment & Plan:   Problem List Items Addressed This Visit       Nervous and Auditory   Non-recurrent acute serous otitis media of right ear - Primary   Has Eustachian tube dysfunction with right ear pressure and hearing loss Chronic dysfunction with recent exacerbation. History of tube placement. . Some improvement with previous steroids and antibiotics. - Prescribe oral antibiotics. - Recommend acetaminophen  or ibuprofen  for pain. - Instruct to avoid cotton swabs. Follow-up with ENT if no improvement after completion of antibiotics Advised cetirizine  10 mg daily  - Continue fluticasone  nasal spray daily. No steroid prescribed due to uncontrolled type 1 diabetes  y.      Relevant Medications   clindamycin  (CLEOCIN ) 300 MG capsule     Other   Tobacco dependence    Current use with acknowledgment of need to quit due to health impact. - Discuss smoking cessation options, including nicotine replacement therap       Meds ordered this encounter  Medications   clindamycin  (CLEOCIN ) 300 MG capsule     Sig: Take 1 capsule (300 mg total) by mouth 3 (three) times daily.    Dispense:  30 capsule    Refill:  0    No follow-ups on file.  Christopher Kail R Annisha Baar, FNP

## 2024-07-31 NOTE — Telephone Encounter (Signed)
 He needs an office visit and if none is available needs to be seen at the mobile unit

## 2024-07-31 NOTE — Patient Instructions (Signed)
 1. Non-recurrent acute serous otitis media of right ear (Primary)  - clindamycin  (CLEOCIN ) 300 MG capsule; Take 1 capsule (300 mg total) by mouth 3 (three) times daily.  Dispense: 30 capsule; Refill: 0   It is important that you exercise regularly at least 30 minutes 5 times a week as tolerated  Think about what you will eat, plan ahead. Choose  clean, green, fresh or frozen over canned, processed or packaged foods which are more sugary, salty and fatty. 70 to 75% of food eaten should be vegetables and fruit. Three meals at set times with snacks allowed between meals, but they must be fruit or vegetables. Aim to eat over a 12 hour period , example 7 am to 7 pm, and STOP after  your last meal of the day. Drink water ,generally about 64 ounces per day, no other drink is as healthy. Fruit juice is best enjoyed in a healthy way, by EATING the fruit.  Thanks for choosing [facility] we consider it a privelige to serve you.

## 2024-07-31 NOTE — Telephone Encounter (Signed)
 FYI Only or Action Required?: FYI only for provider.  Patient was last seen in primary care on 05/30/2024 by Newlin, Enobong, MD.  Called Nurse Triage reporting Sinusitis and Ear Fullness.  Symptoms began a week ago.  Interventions attempted: OTC medications: ibuprofen , antihistamine.  Symptoms are: gradually worsening.  Triage Disposition: See Physician Within 24 Hours  Patient/caregiver understands and will follow disposition?: Yes Reason for Disposition  Earache  Answer Assessment - Initial Assessment Questions Has been taking ibuprofen  and antihistamine  1. LOCATION: Where does it hurt?      Right ear fullness (has ear tube from April), sinus pain/pressure.  2. ONSET: When did the sinus pain start?  (e.g., hours, days)      Sees ENT d/t chronic symptoms. Symptoms worse x 1 week  3. PAIN:     7/10  4. OTHER SYMPTOMS: Do you have any other symptoms? (e.g., sore throat, cough, earache, difficulty breathing)     Earache, ear fullness  5. FEVER    Denies  Protocols used: Sinus Pain or Congestion-A-AH Copied from CRM #[id number]. Topic: Clinical - Red Word Triage >> Jul 31, 2024  9:11 AM Willma R wrote: [facility] that prompted transfer to Nurse Triage: Patient is having right ear pain, feels like its clogged along with a headache and sinus drainage for about 2 weeks, feels like the symptoms have been getting a little worse.

## 2024-08-01 ENCOUNTER — Other Ambulatory Visit: Payer: Self-pay

## 2024-08-01 MED ORDER — PREDNISONE 10 MG (21) PO TBPK
ORAL_TABLET | ORAL | 0 refills | Status: AC
  Filled 2024-08-01: qty 21, 6d supply, fill #0

## 2024-08-09 ENCOUNTER — Other Ambulatory Visit: Payer: Self-pay

## 2024-08-26 ENCOUNTER — Telehealth: Payer: Self-pay | Admitting: Family Medicine

## 2024-08-26 NOTE — Telephone Encounter (Signed)
 2nd attempt contacted pt left vm to resch appt. Pcp not in the office APPT CANCEL

## 2024-09-02 ENCOUNTER — Ambulatory Visit: Admitting: Family Medicine

## 2024-09-12 ENCOUNTER — Other Ambulatory Visit: Payer: Self-pay

## 2024-09-13 ENCOUNTER — Other Ambulatory Visit: Payer: Self-pay

## 2024-09-23 ENCOUNTER — Other Ambulatory Visit: Payer: Self-pay

## 2024-09-23 DIAGNOSIS — E113293 Type 2 diabetes mellitus with mild nonproliferative diabetic retinopathy without macular edema, bilateral: Secondary | ICD-10-CM | POA: Diagnosis not present

## 2024-10-07 ENCOUNTER — Other Ambulatory Visit: Payer: Self-pay

## 2024-10-09 ENCOUNTER — Other Ambulatory Visit: Payer: Self-pay

## 2024-10-24 DIAGNOSIS — Z9622 Myringotomy tube(s) status: Secondary | ICD-10-CM | POA: Diagnosis not present

## 2024-10-24 DIAGNOSIS — H9211 Otorrhea, right ear: Secondary | ICD-10-CM | POA: Diagnosis not present

## 2024-10-24 DIAGNOSIS — H6991 Unspecified Eustachian tube disorder, right ear: Secondary | ICD-10-CM | POA: Diagnosis not present

## 2024-11-11 ENCOUNTER — Other Ambulatory Visit: Payer: Self-pay

## 2024-11-11 ENCOUNTER — Other Ambulatory Visit: Payer: Self-pay | Admitting: Family Medicine

## 2024-11-11 DIAGNOSIS — E1065 Type 1 diabetes mellitus with hyperglycemia: Secondary | ICD-10-CM

## 2024-11-11 MED ORDER — INSULIN LISPRO (1 UNIT DIAL) 100 UNIT/ML (KWIKPEN)
0.0000 [IU] | PEN_INJECTOR | Freq: Three times a day (TID) | SUBCUTANEOUS | 6 refills | Status: AC
  Filled 2024-11-11: qty 12, 34d supply, fill #0

## 2024-11-15 ENCOUNTER — Other Ambulatory Visit: Payer: Self-pay

## 2024-11-22 ENCOUNTER — Other Ambulatory Visit: Payer: Self-pay

## 2024-12-31 ENCOUNTER — Ambulatory Visit: Payer: Self-pay | Admitting: Family Medicine
